# Patient Record
Sex: Male | Born: 1935 | Race: White | Hispanic: No | Marital: Married | Smoking: Never smoker
Health system: Southern US, Community
[De-identification: ages and names within clinical notes are randomized; demographics above are authoritative.]

## PROBLEM LIST (undated history)

## (undated) DIAGNOSIS — C439 Malignant melanoma of skin, unspecified: Secondary | ICD-10-CM

## (undated) DIAGNOSIS — G4733 Obstructive sleep apnea (adult) (pediatric): Secondary | ICD-10-CM

## (undated) DIAGNOSIS — Z8619 Personal history of other infectious and parasitic diseases: Secondary | ICD-10-CM

## (undated) DIAGNOSIS — Z950 Presence of cardiac pacemaker: Secondary | ICD-10-CM

## (undated) DIAGNOSIS — E785 Hyperlipidemia, unspecified: Secondary | ICD-10-CM

## (undated) DIAGNOSIS — I10 Essential (primary) hypertension: Secondary | ICD-10-CM

## (undated) DIAGNOSIS — R7303 Prediabetes: Secondary | ICD-10-CM

## (undated) DIAGNOSIS — I495 Sick sinus syndrome: Secondary | ICD-10-CM

## (undated) DIAGNOSIS — G4719 Other hypersomnia: Secondary | ICD-10-CM

## (undated) DIAGNOSIS — F0390 Unspecified dementia without behavioral disturbance: Secondary | ICD-10-CM

## (undated) DIAGNOSIS — I2510 Atherosclerotic heart disease of native coronary artery without angina pectoris: Secondary | ICD-10-CM

## (undated) DIAGNOSIS — R55 Syncope and collapse: Secondary | ICD-10-CM

## (undated) DIAGNOSIS — Z87442 Personal history of urinary calculi: Secondary | ICD-10-CM

## (undated) DIAGNOSIS — R413 Other amnesia: Secondary | ICD-10-CM

## (undated) HISTORY — DX: Obstructive sleep apnea (adult) (pediatric): G47.33

## (undated) HISTORY — DX: Hyperlipidemia, unspecified: E78.5

## (undated) HISTORY — DX: Essential (primary) hypertension: I10

## (undated) HISTORY — DX: Sick sinus syndrome: I49.5

## (undated) HISTORY — DX: Other hypersomnia: G47.19

## (undated) HISTORY — PX: OTHER SURGICAL HISTORY: SHX169

## (undated) HISTORY — DX: Malignant melanoma of skin, unspecified: C43.9

## (undated) HISTORY — DX: Syncope and collapse: R55

## (undated) HISTORY — DX: Atherosclerotic heart disease of native coronary artery without angina pectoris: I25.10

---

## 1993-06-08 DIAGNOSIS — I2510 Atherosclerotic heart disease of native coronary artery without angina pectoris: Secondary | ICD-10-CM | POA: Insufficient documentation

## 1993-06-08 HISTORY — DX: Atherosclerotic heart disease of native coronary artery without angina pectoris: I25.10

## 2016-01-06 ENCOUNTER — Encounter (INDEPENDENT_AMBULATORY_CARE_PROVIDER_SITE_OTHER): Payer: Self-pay

## 2016-01-06 ENCOUNTER — Encounter: Payer: Self-pay | Admitting: Cardiovascular Disease

## 2016-01-06 ENCOUNTER — Ambulatory Visit (INDEPENDENT_AMBULATORY_CARE_PROVIDER_SITE_OTHER): Payer: Medicare Other | Admitting: Cardiovascular Disease

## 2016-01-06 VITALS — BP 104/70 | HR 53 | Ht 68.5 in | Wt 177.0 lb

## 2016-01-06 DIAGNOSIS — E785 Hyperlipidemia, unspecified: Secondary | ICD-10-CM | POA: Diagnosis not present

## 2016-01-06 DIAGNOSIS — I2510 Atherosclerotic heart disease of native coronary artery without angina pectoris: Secondary | ICD-10-CM | POA: Diagnosis not present

## 2016-01-06 DIAGNOSIS — R55 Syncope and collapse: Secondary | ICD-10-CM | POA: Diagnosis not present

## 2016-01-06 NOTE — Patient Instructions (Addendum)
Medication Instructions:  Your physician recommends that you continue on your current medications as directed. Please refer to the Current Medication list given to you today.   Labwork: none  Testing/Procedures: Your physician has requested that you have an echocardiogram. Echocardiography is a painless test that uses sound waves to create images of your heart. It provides your doctor with information about the size and shape of your heart and how well your heart's chambers and valves are working. This procedure takes approximately one hour. There are no restrictions for this procedure.  Your physician has recommended that you wear an event monitor. Event monitors are medical devices that record the heart's electrical activity. Doctors most often [place] these monitors to diagnose arrhythmias. Arrhythmias are problems with the speed or rhythm of the heartbeat. The monitor is a small, portable device. You can wear one while you do your normal daily activities. This is usually used to diagnose what is causing palpitations/syncope (passing out).    Follow-Up: Your physician recommends that you schedule a follow-up appointment in: two months with Dr. Fletcher Anon.    Any Other Special Instructions Will Be Listed Below (If Applicable).     If you need a refill on your cardiac medications before your next appointment, please call your pharmacy.   Cardiac Event Monitoring A cardiac event monitor is a small recording device used to help detect abnormal heart rhythms (arrhythmias). The monitor is used to record heart rhythm when noticeable symptoms such as the following occur:  Fast heartbeats (palpitations), such as heart racing or fluttering.  Dizziness.  Fainting or light-headedness.  Unexplained weakness. The monitor is wired to two electrodes placed on your chest. Electrodes are flat, sticky disks that attach to your skin. The monitor can be worn for up to 30 days. You will wear the monitor at  all times, except when bathing.  HOW TO USE YOUR CARDIAC EVENT MONITOR A technician will prepare your chest for the electrode placement. The technician will show you how to place the electrodes, how to work the monitor, and how to replace the batteries. Take time to practice using the monitor before you leave the office. Make sure you understand how to send the information from the monitor to your health care provider. This requires a telephone with a landline, not a cell phone. You need to:  Wear your monitor at all times, except when you are in water:  Do not get the monitor wet.  Take the monitor off when bathing. Do not swim or use a hot tub with it on.  Keep your skin clean. Do not put body lotion or moisturizer on your chest.  Change the electrodes daily or any time they stop sticking to your skin. You might need to use tape to keep them on.  It is possible that your skin under the electrodes could become irritated. To keep this from happening, try to put the electrodes in slightly different places on your chest. However, they must remain in the area under your left breast and in the upper right section of your chest.  Make sure the monitor is safely clipped to your clothing or in a location close to your body that your health care provider recommends.  Press the button to record when you feel symptoms of heart trouble, such as dizziness, weakness, light-headedness, palpitations, thumping, shortness of breath, unexplained weakness, or a fluttering or racing heart. The monitor is always on and records what happened slightly before you pressed the button, so do  not worry about being too late to get good information.  Keep a diary of your activities, such as walking, doing chores, and taking medicine. It is especially important to note what you were doing when you pushed the button to record your symptoms. This will help your health care provider determine what might be contributing to your  symptoms. The information stored in your monitor will be reviewed by your health care provider alongside your diary entries.  Send the recorded information as recommended by your health care provider. It is important to understand that it will take some time for your health care provider to process the results.  Change the batteries as recommended by your health care provider. SEEK IMMEDIATE MEDICAL CARE IF:   You have chest pain.  You have extreme difficulty breathing or shortness of breath.  You develop a very fast heartbeat that persists.  You develop dizziness that does not go away.  You faint or constantly feel you are about to faint.   This information is not intended to replace advice given to you by your health care provider. Make sure you discuss any questions you have with your health care provider.   Document Released: 03/03/2008 Document Revised: 06/15/2014 Document Reviewed: 11/21/2012 Elsevier Interactive Patient Education [employer]. Echocardiogram An echocardiogram, or echocardiography, uses sound waves (ultrasound) to produce an image of your heart. The echocardiogram is simple, painless, obtained within a short period of time, and offers valuable information to your health care provider. The images from an echocardiogram can provide information such as:  Evidence of coronary artery disease (CAD).  Heart size.  Heart muscle function.  Heart valve function.  Aneurysm detection.  Evidence of a past heart attack.  Fluid buildup around the heart.  Heart muscle thickening.  Assess heart valve function. LET [facility] CARE PROVIDER KNOW ABOUT:  Any allergies you have.  All medicines you are taking, including vitamins, herbs, eye drops, creams, and over-the-counter medicines.  Previous problems you or members of your family have had with the use of anesthetics.  Any blood disorders you have.  Previous surgeries you have had.  Medical conditions  you have.  Possibility of pregnancy, if this applies. BEFORE THE PROCEDURE  No special preparation is needed. Eat and drink normally.  PROCEDURE   In order to produce an image of your heart, gel will be applied to your chest and a wand-like tool (transducer) will be moved over your chest. The gel will help transmit the sound waves from the transducer. The sound waves will harmlessly bounce off your heart to allow the heart images to be captured in real-time motion. These images will then be recorded.  You may need an IV to receive a medicine that improves the quality of the pictures. AFTER THE PROCEDURE You may return to your normal schedule including diet, activities, and medicines, unless your health care provider tells you otherwise.   This information is not intended to replace advice given to you by your health care provider. Make sure you discuss any questions you have with your health care provider.   Document Released: 05/22/2000 Document Revised: 06/15/2014 Document Reviewed: 01/30/2013 Elsevier Interactive Patient Education [employer].

## 2016-01-07 ENCOUNTER — Encounter: Payer: Self-pay | Admitting: Cardiovascular Disease

## 2016-01-07 NOTE — Progress Notes (Signed)
Cardiology Office Note   Date:  01/07/2016   ID:  Joel Nelson, DOB 06/25/1933, MRN [id number]  PCP:  Manon Hilding, MD  Cardiologist:   Kathlyn Sacramento, MD   Chief Complaint  Patient presents with  . New Patient (Initial Visit)    NO COMPLAINTS.      History of Present Illness: Joel Nelson is a 82 y.o. male who is here today to establish cardiovascular care. He has known history of coronary artery disease with remote left circumflex angioplasty without stent placement in 1995. No ischemic cardiac events since then. He use to follow-up with [place] [facility] in [place]. Most recent visit was in 2015. Last cardiac evaluation included an echocardiogram in 2009 which showed normal LV systolic function with mild mitral and tricuspid regurgitation. Stress echo in 2009 showed no evidence of ischemia. He has chronic medical conditions that include borderline diabetes, hyperlipidemia, hypertension and early dementia. He is not a smoker and does not drink alcohol. Family history is negative for coronary artery disease. The patient was started on sertraline and Aricept this year. He had an episode of syncope about one month ago while he was working in the yard. He doesn't really remember the details but he was outside in the hot weather and lost consciousness suddenly. It was witnessed and there was no noted seizure activities or incontinence. No tongue biting. The episode was sudden. No recurrent episodes since then. He denies orthostatic dizziness. No chest pain. He has mild exertional dyspnea.   Past Medical History:  Diagnosis Date  . Hyperlipidemia   . Hypertension   . Melanoma [facility])     Past Surgical History:  Procedure Laterality Date  . herniated disk repair       Current Outpatient Prescriptions  Medication Sig Dispense Refill  . ACCU-CHEK SOFTCLIX LANCETS lancets as directed.    Marland Kitchen aspirin EC 81 MG tablet Take 81 mg by mouth daily.    . Blood Glucose  Calibration (ACCU-CHEK COMPACT PLUS CONTROL) SOLN as directed.    . Blood Glucose Monitoring Suppl (ACCU-CHEK COMPACT CARE KIT) KIT as directed.    . Cyanocobalamin (RA VITAMIN B-12 TR) 1000 MCG TBCR Take 1 tablet by mouth daily.    Marland Kitchen ezetimibe (ZETIA) 10 MG tablet Take 10 mg by mouth daily.    . fluticasone (FLONASE) 50 MCG/ACT nasal spray Place 1 spray into the nose daily as needed.    Marland Kitchen glucose blood (ACCU-CHEK COMPACT PLUS) test strip as directed.    Marland Kitchen losartan (COZAAR) 25 MG tablet Take 25 mg by mouth daily.    . Multiple Vitamin (MULTI-VITAMINS) TABS Take 1 tablet by mouth daily.    . nitroGLYCERIN (NITROSTAT) 0.4 MG SL tablet Place 0.4 mg under the tongue every 5 (five) minutes as needed.    . rivastigmine (EXELON) 9.5 mg/24hr Place 9.5 mg onto the skin daily.    . rosuvastatin (CRESTOR) 5 MG tablet Take 5 mg by mouth daily.    . sertraline (ZOLOFT) 50 MG tablet Take 50 mg by mouth daily.    . sildenafil (REVATIO) 20 MG tablet Take 20 mg by mouth daily as needed.    . tamsulosin (FLOMAX) 0.4 MG CAPS capsule Take 0.4 mg by mouth daily.     No current facility-administered medications for this visit.     Allergies:   Review of patient's allergies indicates no known allergies.    Social History:  The patient  reports that he has never smoked. He has never used  smokeless tobacco. He reports that he does not drink alcohol or use drugs.   Family History:  The patient's family history includes Alzheimer's disease in his mother; Lung cancer in his sister.    ROS:  Please see the history of present illness.   Otherwise, review of systems are positive for none.   All other systems are reviewed and negative.    PHYSICAL EXAM: VS:  BP 104/70 (BP Location: Left Arm, Patient Position: Sitting, Cuff Size: Normal)   Pulse (!) 53   Ht 5' 8.5" (1.74 m)   Wt 177 lb (80.3 kg)   BMI 26.52 kg/m  , BMI Body mass index is 26.52 kg/m. GEN: Well nourished, well developed, in no acute distress    HEENT: normal  Neck: no JVD, carotid bruits, or masses Cardiac: RRR; no murmurs, rubs, or gallops,no edema  Respiratory:  clear to auscultation bilaterally, normal work of breathing GI: soft, nontender, nondistended, + BS MS: no deformity or atrophy  Skin: warm and dry, no rash Neuro:  Strength and sensation are intact Psych: euthymic mood, full affect   EKG:  EKG is ordered today. The ekg ordered today demonstrates sinus bradycardia with a heart rate of 53 bpm.   Recent Labs: No results found for requested labs within last 8760 hours.    Lipid Panel No results found for: CHOL, TRIG, HDL, CHOLHDL, VLDL, LDLCALC, LDLDIRECT    Wt Readings from Last 3 Encounters:  01/06/16 177 lb (80.3 kg)      Other studies Reviewed: Additional studies/ records that were reviewed today include: Previous cardiac records. Review of the above records demonstrates: Summarized above. A total of 20 minutes was needed to review his previous records   ASSESSMENT AND PLAN:  1.  Syncope: The possibilities include vasovagal, orthostatic hypotension or cardiac arrhythmia given his known history of coronary artery disease. He is also noted to be bradycardic which might have contributed. The addition of sertraline and Aricept this year might increase his risk of syncope related to orthostatic hypotension. Given that the episode was sudden with no reported prodrome, we have to exclude arrhythmia. Thus, I requested a 30 day outpatient telemetry. He is not having symptoms frequently enough to be captured by a Holter monitor. I also requested an echocardiogram to ensure no structural heart abnormalities.  2. Coronary artery disease involving native coronary arteries without angina: Continue medical therapy.  3. Hyperlipidemia: He is currently on small dose rosuvastatin and Zetia. Recommend a target LDL of less than 70.    Disposition:   FU with me in 2 months  Signed,  Kathlyn Sacramento, MD  01/07/2016  1:16 PM    [facility]

## 2016-01-11 ENCOUNTER — Ambulatory Visit (INDEPENDENT_AMBULATORY_CARE_PROVIDER_SITE_OTHER): Payer: Medicare Other

## 2016-01-11 DIAGNOSIS — R55 Syncope and collapse: Secondary | ICD-10-CM | POA: Diagnosis not present

## 2016-01-22 ENCOUNTER — Ambulatory Visit (INDEPENDENT_AMBULATORY_CARE_PROVIDER_SITE_OTHER): Payer: Medicare Other

## 2016-01-22 ENCOUNTER — Encounter (INDEPENDENT_AMBULATORY_CARE_PROVIDER_SITE_OTHER): Payer: Self-pay

## 2016-01-22 ENCOUNTER — Other Ambulatory Visit: Payer: Self-pay

## 2016-01-22 DIAGNOSIS — R55 Syncope and collapse: Secondary | ICD-10-CM

## 2016-01-22 LAB — ECHOCARDIOGRAM COMPLETE
CHL CUP TV REG PEAK VELOCITY: 240 cm/s
EERAT: 5.65
EWDT: 317 ms
FS: 33 % (ref 28–44)
IVS/LV PW RATIO, ED: 0.98
LA ID, A-P, ES: 36 mm
LA vol index: 22.8 mL/m2
LADIAMINDEX: 1.81 cm/m2
LAVOL: 45.3 mL
LAVOLA4C: 52.2 mL
LDCA: 2.84 cm2
LEFT ATRIUM END SYS DIAM: 36 mm
LV E/e' medial: 5.65
LV PW d: 8.04 mm — AB (ref 0.6–1.1)
LV TDI E'MEDIAL: 7.06
LVEEAVG: 5.65
LVELAT: 9.09 cm/s
LVOT SV: 63 mL
LVOT VTI: 22.3 cm
LVOT peak vel: 104 cm/s
LVOTD: 19 mm
MV Dec: 317
MV pk E vel: 51.4 m/s
MVPKAVEL: 66.5 m/s
RV TAPSE: 22.9 mm
RV sys press: 26 mmHg
TDI e' lateral: 9.09
TRMAXVEL: 240 cm/s

## 2016-02-24 ENCOUNTER — Encounter: Payer: Self-pay | Admitting: Cardiovascular Disease

## 2016-02-24 ENCOUNTER — Ambulatory Visit (INDEPENDENT_AMBULATORY_CARE_PROVIDER_SITE_OTHER): Payer: Medicare Other | Admitting: Cardiovascular Disease

## 2016-02-24 VITALS — BP 130/70 | HR 60 | Ht 68.5 in | Wt 182.0 lb

## 2016-02-24 DIAGNOSIS — I2510 Atherosclerotic heart disease of native coronary artery without angina pectoris: Secondary | ICD-10-CM | POA: Diagnosis not present

## 2016-02-24 DIAGNOSIS — E785 Hyperlipidemia, unspecified: Secondary | ICD-10-CM

## 2016-02-24 NOTE — Progress Notes (Signed)
Cardiology Office Note   Date:  02/24/2016   ID:  Joel Nelson, DOB 1934/04/15, MRN [id number]  PCP:  Manon Hilding, MD  Cardiologist:   Kathlyn Sacramento, MD   Chief Complaint  Patient presents with  . other    2 month f/u echo and event monitor. Meds reviewed verbally with pt.      History of Present Illness: Joel Nelson is a 82 y.o. male who is here today For a follow-up visit regarding coronary artery disease and recent syncope.  He has known history of coronary artery disease with remote left circumflex angioplasty without stent placement in 1995. No ischemic cardiac events since then. He has chronic medical conditions that include borderline diabetes, hyperlipidemia, hypertension and early dementia. He is not a smoker and does not drink alcohol. Family history is negative for coronary artery disease. He was seen in July for a syncopal episode which happened while he was working in the yard.  He was on Aricept at that time. Orthostatic syncope was suspected.  He had an echocardiogram done which showed normal LV systolic function, grade 1 diastolic dysfunction, mild mitral regurgitation and no evidence of pulmonary hypertension. A 30 day outpatient telemetry showed no significant arrhythmia. Lowest heart rate was 49 bpm. He has been doing well overall with no recurrent episodes. He denies any chest pain or shortness of breath.  Past Medical History:  Diagnosis Date  . Coronary artery disease 1995    remote left circumflex angioplasty without stent placement in 1995  . Hyperlipidemia   . Hypertension   . Melanoma [facility])     Past Surgical History:  Procedure Laterality Date  . herniated disk repair       Current Outpatient Prescriptions  Medication Sig Dispense Refill  . ACCU-CHEK SOFTCLIX LANCETS lancets as directed.    Marland Kitchen aspirin EC 81 MG tablet Take 81 mg by mouth daily.    . Blood Glucose Calibration (ACCU-CHEK COMPACT PLUS CONTROL) SOLN as directed.    . Blood  Glucose Monitoring Suppl (ACCU-CHEK COMPACT CARE KIT) KIT as directed.    . Cyanocobalamin (RA VITAMIN B-12 TR) 1000 MCG TBCR Take 1 tablet by mouth daily.    Marland Kitchen ezetimibe (ZETIA) 10 MG tablet Take 10 mg by mouth daily.    . fluticasone (FLONASE) 50 MCG/ACT nasal spray Place 1 spray into the nose daily as needed.    Marland Kitchen glucose blood (ACCU-CHEK COMPACT PLUS) test strip as directed.    Marland Kitchen losartan (COZAAR) 25 MG tablet Take 25 mg by mouth daily.    . Multiple Vitamin (MULTI-VITAMINS) TABS Take 1 tablet by mouth daily.    . nitroGLYCERIN (NITROSTAT) 0.4 MG SL tablet Place 0.4 mg under the tongue every 5 (five) minutes as needed.    . rivastigmine (EXELON) 9.5 mg/24hr Place 9.5 mg onto the skin daily.    . rosuvastatin (CRESTOR) 5 MG tablet Take 5 mg by mouth daily.    . sertraline (ZOLOFT) 50 MG tablet Take 50 mg by mouth daily.    . sildenafil (REVATIO) 20 MG tablet Take 20 mg by mouth daily as needed.    . tamsulosin (FLOMAX) 0.4 MG CAPS capsule Take 0.4 mg by mouth daily.     No current facility-administered medications for this visit.     Allergies:   Review of patient's allergies indicates no known allergies.    Social History:  The patient  reports that he has never smoked. He has never used smokeless tobacco. He reports that  he does not drink alcohol or use drugs.   Family History:  The patient's family history includes Alzheimer's disease in his mother; Lung cancer in his sister.    ROS:  Please see the history of present illness.   Otherwise, review of systems are positive for none.   All other systems are reviewed and negative.    PHYSICAL EXAM: VS:  BP 130/70 (BP Location: Left Arm, Patient Position: Sitting, Cuff Size: Normal)   Pulse 60   Ht 5' 8.5" (1.74 m)   Wt 182 lb (82.6 kg)   BMI 27.27 kg/m  , BMI Body mass index is 27.27 kg/m. GEN: Well nourished, well developed, in no acute distress  HEENT: normal  Neck: no JVD, carotid bruits, or masses Cardiac: RRR; no murmurs,  rubs, or gallops,no edema  Respiratory:  clear to auscultation bilaterally, normal work of breathing GI: soft, nontender, nondistended, + BS MS: no deformity or atrophy  Skin: warm and dry, no rash Neuro:  Strength and sensation are intact Psych: euthymic mood, full affect   EKG:  EKG is not ordered today.    Recent Labs: No results found for requested labs within last 8760 hours.    Lipid Panel No results found for: CHOL, TRIG, HDL, CHOLHDL, VLDL, LDLCALC, LDLDIRECT    Wt Readings from Last 3 Encounters:  02/24/16 182 lb (82.6 kg)  01/06/16 177 lb (80.3 kg)      Other studies Reviewed: Additional studies/ records that were reviewed today include: Previous cardiac records. Review of the above records demonstrates: Summarized above. A total of 20 minutes was needed to review his previous records   ASSESSMENT AND PLAN:  1.  Syncope:Was likely due to orthostatic hypotension. Echocardiogram showed normal LV systolic function with no evidence of significant valvular heart disease. Outpatient telemetry showed no evidence of arrhythmia.  No further recurrent episodes. If he develops recurrent dizziness, syncope or presyncope, we can consider carotid Doppler.  2. Coronary artery disease involving native coronary arteries without angina: Continue medical therapy.  3. Hyperlipidemia: He is currently on small dose rosuvastatin and Zetia. Recommend a target LDL of less than 70.    Disposition:   FU with me in 12 months  Signed,  Kathlyn Sacramento, MD  02/24/2016 2:04 PM    [place]

## 2016-02-24 NOTE — Patient Instructions (Signed)
Medication Instructions: Continue same medications.   Labwork: None.   Procedures/Testing: None.   Follow-Up: 1 year with Dr. Keiston Manley.   Any Additional Special Instructions Will Be Listed Below (If Applicable).     If you need a refill on your cardiac medications before your next appointment, please call your pharmacy.   

## 2016-03-02 ENCOUNTER — Ambulatory Visit: Payer: Medicare Other | Admitting: Cardiovascular Disease

## 2016-03-24 ENCOUNTER — Emergency Department (HOSPITAL_COMMUNITY): Payer: Medicare Other

## 2016-03-24 ENCOUNTER — Encounter (HOSPITAL_COMMUNITY)
Admission: EM | Disposition: A | Discharge status: Home / Self Care | Payer: Self-pay | Source: Home / Self Care | Attending: Internal Medicine

## 2016-03-24 ENCOUNTER — Encounter (HOSPITAL_COMMUNITY): Payer: Self-pay | Admitting: *Deleted

## 2016-03-24 ENCOUNTER — Inpatient Hospital Stay (HOSPITAL_COMMUNITY)
Admission: EM | Admit: 2016-03-24 | Discharge: 2016-03-25 | DRG: 242 | Disposition: A | Discharge status: Home / Self Care | Payer: Medicare Other | Attending: Internal Medicine | Admitting: Internal Medicine

## 2016-03-24 DIAGNOSIS — Z7951 Long term (current) use of inhaled steroids: Secondary | ICD-10-CM | POA: Diagnosis not present

## 2016-03-24 DIAGNOSIS — I495 Sick sinus syndrome: Secondary | ICD-10-CM | POA: Diagnosis not present

## 2016-03-24 DIAGNOSIS — R001 Bradycardia, unspecified: Secondary | ICD-10-CM

## 2016-03-24 DIAGNOSIS — E785 Hyperlipidemia, unspecified: Secondary | ICD-10-CM | POA: Diagnosis present

## 2016-03-24 DIAGNOSIS — Z820 Family history of epilepsy and other diseases of the nervous system: Secondary | ICD-10-CM | POA: Diagnosis not present

## 2016-03-24 DIAGNOSIS — I10 Essential (primary) hypertension: Secondary | ICD-10-CM | POA: Diagnosis present

## 2016-03-24 DIAGNOSIS — I459 Conduction disorder, unspecified: Secondary | ICD-10-CM

## 2016-03-24 DIAGNOSIS — R55 Syncope and collapse: Secondary | ICD-10-CM

## 2016-03-24 DIAGNOSIS — Z7982 Long term (current) use of aspirin: Secondary | ICD-10-CM | POA: Diagnosis not present

## 2016-03-24 DIAGNOSIS — Z801 Family history of malignant neoplasm of trachea, bronchus and lung: Secondary | ICD-10-CM

## 2016-03-24 DIAGNOSIS — Z950 Presence of cardiac pacemaker: Secondary | ICD-10-CM

## 2016-03-24 DIAGNOSIS — I469 Cardiac arrest, cause unspecified: Secondary | ICD-10-CM | POA: Diagnosis present

## 2016-03-24 DIAGNOSIS — Z9861 Coronary angioplasty status: Secondary | ICD-10-CM | POA: Diagnosis not present

## 2016-03-24 DIAGNOSIS — Z85820 Personal history of malignant melanoma of skin: Secondary | ICD-10-CM

## 2016-03-24 DIAGNOSIS — I2510 Atherosclerotic heart disease of native coronary artery without angina pectoris: Secondary | ICD-10-CM | POA: Diagnosis present

## 2016-03-24 DIAGNOSIS — Z959 Presence of cardiac and vascular implant and graft, unspecified: Secondary | ICD-10-CM

## 2016-03-24 HISTORY — DX: Sick sinus syndrome: I49.5

## 2016-03-24 HISTORY — DX: Personal history of urinary calculi: Z87.442

## 2016-03-24 HISTORY — DX: Other amnesia: R41.3

## 2016-03-24 HISTORY — DX: Prediabetes: R73.03

## 2016-03-24 HISTORY — DX: Presence of cardiac pacemaker: Z95.0

## 2016-03-24 HISTORY — DX: Personal history of other infectious and parasitic diseases: Z86.19

## 2016-03-24 HISTORY — PX: EP IMPLANTABLE DEVICE: SHX172B

## 2016-03-24 HISTORY — DX: Syncope and collapse: R55

## 2016-03-24 LAB — I-STAT CHEM 8, ED
BUN: 17 mg/dL (ref 6–20)
Calcium, Ion: 1.18 mmol/L (ref 1.15–1.40)
Chloride: 100 mmol/L — ABNORMAL LOW (ref 101–111)
Creatinine, Ser: 1 mg/dL (ref 0.61–1.24)
GLUCOSE: 139 mg/dL — AB (ref 65–99)
HEMATOCRIT: 47 % (ref 39.0–52.0)
HEMOGLOBIN: 16 g/dL (ref 13.0–17.0)
POTASSIUM: 4.5 mmol/L (ref 3.5–5.1)
Sodium: 140 mmol/L (ref 135–145)
TCO2: 30 mmol/L (ref 0–100)

## 2016-03-24 LAB — COMPREHENSIVE METABOLIC PANEL
ALK PHOS: 48 U/L (ref 38–126)
ALT: 46 U/L (ref 17–63)
ANION GAP: 5 (ref 5–15)
AST: 29 U/L (ref 15–41)
Albumin: 3.8 g/dL (ref 3.5–5.0)
BILIRUBIN TOTAL: 0.7 mg/dL (ref 0.3–1.2)
BUN: 13 mg/dL (ref 6–20)
CALCIUM: 9.3 mg/dL (ref 8.9–10.3)
CO2: 31 mmol/L (ref 22–32)
Chloride: 104 mmol/L (ref 101–111)
Creatinine, Ser: 1.03 mg/dL (ref 0.61–1.24)
Glucose, Bld: 143 mg/dL — ABNORMAL HIGH (ref 65–99)
Potassium: 4.5 mmol/L (ref 3.5–5.1)
SODIUM: 140 mmol/L (ref 135–145)
TOTAL PROTEIN: 6.3 g/dL — AB (ref 6.5–8.1)

## 2016-03-24 LAB — I-STAT TROPONIN, ED: TROPONIN I, POC: 0 ng/mL (ref 0.00–0.08)

## 2016-03-24 LAB — CBC WITH DIFFERENTIAL/PLATELET
BASOS PCT: 0 %
Basophils Absolute: 0 10*3/uL (ref 0.0–0.1)
EOS ABS: 0.1 10*3/uL (ref 0.0–0.7)
EOS PCT: 1 %
HCT: 46 % (ref 39.0–52.0)
HEMOGLOBIN: 15.2 g/dL (ref 13.0–17.0)
Lymphocytes Relative: 15 %
Lymphs Abs: 1.2 10*3/uL (ref 0.7–4.0)
MCH: 30.2 pg (ref 26.0–34.0)
MCHC: 33 g/dL (ref 30.0–36.0)
MCV: 91.5 fL (ref 78.0–100.0)
Monocytes Absolute: 0.7 10*3/uL (ref 0.1–1.0)
Monocytes Relative: 8 %
NEUTROS PCT: 76 %
Neutro Abs: 6.5 10*3/uL (ref 1.7–7.7)
PLATELETS: 169 10*3/uL (ref 150–400)
RBC: 5.03 MIL/uL (ref 4.22–5.81)
RDW: 13.8 % (ref 11.5–15.5)
WBC: 8.6 10*3/uL (ref 4.0–10.5)

## 2016-03-24 LAB — TSH: TSH: 2.35 u[IU]/mL (ref 0.350–4.500)

## 2016-03-24 LAB — MAGNESIUM: MAGNESIUM: 1.9 mg/dL (ref 1.7–2.4)

## 2016-03-24 LAB — GLUCOSE, CAPILLARY: GLUCOSE-CAPILLARY: 100 mg/dL — AB (ref 65–99)

## 2016-03-24 LAB — TROPONIN I

## 2016-03-24 SURGERY — PACEMAKER IMPLANT

## 2016-03-24 MED ORDER — INSULIN ASPART 100 UNIT/ML ~~LOC~~ SOLN: 0.0000 [IU] | Freq: Every day | SUBCUTANEOUS | Status: DC

## 2016-03-24 MED ORDER — LOSARTAN POTASSIUM 25 MG PO TABS
25.0000 mg | ORAL_TABLET | Freq: Every day | ORAL | Status: DC
Start: 2016-03-25 — End: 2016-03-25
  Administered 2016-03-25: 25 mg via ORAL
  Filled 2016-03-24: qty 1

## 2016-03-24 MED ORDER — CEFAZOLIN SODIUM-DEXTROSE 2-4 GM/100ML-% IV SOLN
2.0000 g | INTRAVENOUS | Status: AC
  Administered 2016-03-24: 2 g via INTRAVENOUS

## 2016-03-24 MED ORDER — SERTRALINE HCL 50 MG PO TABS
50.0000 mg | ORAL_TABLET | Freq: Every day | ORAL | Status: DC
  Filled 2016-03-24: qty 1

## 2016-03-24 MED ORDER — SODIUM CHLORIDE 0.9 % IV SOLN
250.0000 mL | INTRAVENOUS | Status: DC | PRN
Start: 2016-03-24 — End: 2016-03-25

## 2016-03-24 MED ORDER — LIDOCAINE HCL (PF) 1 % IJ SOLN
INTRAMUSCULAR | Status: AC
  Filled 2016-03-24: qty 60

## 2016-03-24 MED ORDER — HYDROCODONE-ACETAMINOPHEN 5-325 MG PO TABS: 1.0000 | ORAL_TABLET | ORAL | Status: DC | PRN

## 2016-03-24 MED ORDER — CHLORHEXIDINE GLUCONATE 4 % EX LIQD
60.0000 mL | Freq: Once | CUTANEOUS | Status: DC
  Filled 2016-03-24: qty 60

## 2016-03-24 MED ORDER — SODIUM CHLORIDE 0.9 % IV SOLN: INTRAVENOUS | Status: DC

## 2016-03-24 MED ORDER — CEFAZOLIN IN D5W 1 GM/50ML IV SOLN
1.0000 g | Freq: Four times a day (QID) | INTRAVENOUS | Status: AC
  Administered 2016-03-24 – 2016-03-25 (×3): 1 g via INTRAVENOUS
  Filled 2016-03-24 (×3): qty 50

## 2016-03-24 MED ORDER — ONDANSETRON HCL 4 MG/2ML IJ SOLN: 4.0000 mg | Freq: Four times a day (QID) | INTRAMUSCULAR | Status: DC | PRN

## 2016-03-24 MED ORDER — INSULIN ASPART 100 UNIT/ML ~~LOC~~ SOLN
0.0000 [IU] | Freq: Three times a day (TID) | SUBCUTANEOUS | Status: DC
  Administered 2016-03-24: 1 [IU] via SUBCUTANEOUS

## 2016-03-24 MED ORDER — SODIUM CHLORIDE 0.9 % IR SOLN
Status: AC
  Filled 2016-03-24: qty 2

## 2016-03-24 MED ORDER — SODIUM CHLORIDE 0.9 % IR SOLN
80.0000 mg | Status: AC
  Administered 2016-03-24: 80 mg

## 2016-03-24 MED ORDER — SODIUM CHLORIDE 0.9% FLUSH
3.0000 mL | Freq: Two times a day (BID) | INTRAVENOUS | Status: DC
  Administered 2016-03-24: 3 mL via INTRAVENOUS

## 2016-03-24 MED ORDER — LIDOCAINE HCL (PF) 1 % IJ SOLN
INTRAMUSCULAR | Status: DC | PRN
  Administered 2016-03-24: 42 mL via INTRADERMAL

## 2016-03-24 MED ORDER — ACETAMINOPHEN 325 MG PO TABS
325.0000 mg | ORAL_TABLET | ORAL | Status: DC | PRN
  Administered 2016-03-24 – 2016-03-25 (×2): 650 mg via ORAL
  Filled 2016-03-24 (×2): qty 2

## 2016-03-24 MED ORDER — SODIUM CHLORIDE 0.9% FLUSH: 3.0000 mL | INTRAVENOUS | Status: DC | PRN

## 2016-03-24 MED ORDER — HEPARIN (PORCINE) IN NACL 2-0.9 UNIT/ML-% IJ SOLN
INTRAMUSCULAR | Status: DC | PRN
  Administered 2016-03-24: 500 mL

## 2016-03-24 MED ORDER — TAMSULOSIN HCL 0.4 MG PO CAPS
0.4000 mg | ORAL_CAPSULE | Freq: Every day | ORAL | Status: DC
  Administered 2016-03-24: 0.4 mg via ORAL
  Filled 2016-03-24: qty 1

## 2016-03-24 MED ORDER — HEPARIN (PORCINE) IN NACL 2-0.9 UNIT/ML-% IJ SOLN
INTRAMUSCULAR | Status: AC
  Filled 2016-03-24: qty 500

## 2016-03-24 MED ORDER — SODIUM CHLORIDE 0.9 % IV SOLN
INTRAVENOUS | Status: DC | PRN
  Administered 2016-03-24 (×2): 50 mL/h via INTRAVENOUS

## 2016-03-24 MED ORDER — CEFAZOLIN SODIUM-DEXTROSE 2-4 GM/100ML-% IV SOLN
INTRAVENOUS | Status: AC
  Filled 2016-03-24: qty 100

## 2016-03-24 SURGICAL SUPPLY — 7 items
CABLE SURGICAL S-101-97-12 (CABLE) ×3 IMPLANT
LEAD TENDRIL SDX 2088TC-52CM (Lead) ×3 IMPLANT
LEAD TENDRIL SDX 2088TC-58CM (Lead) ×3 IMPLANT
PACEMAKER ASSURITY DR-RF (Pacemaker) ×3 IMPLANT
PAD DEFIB LIFELINK (PAD) ×3 IMPLANT
SHEATH CLASSIC 8F (SHEATH) ×6 IMPLANT
TRAY PACEMAKER INSERTION (PACKS) ×3 IMPLANT

## 2016-03-24 NOTE — ED Provider Notes (Signed)
[facility] DEPT Provider Note   CSN: [id number] Arrival date & time: 03/24/16  4332     History   Chief Complaint Chief Complaint  Patient presents with  . Loss of Consciousness    HPI Joel Nelson is a 82 y.o. male.  HPI Patient presents after syncopal episode. Shortly after arriving back into the room he had a bradycardic episode that went into asystole and required CPR. He came back after compressions. Complaining of slight right-sided chest pain. Slightly confused afterward and does have a mild baseline dementia. Patient has had previous episodes of passing out. Today he had to more severe episodes. Wife later told me that he had passed out and EMS was called and was fine at that point. They were going to follow-up with primary care doctor. 50 minutes later had another event. But then came to the ER. He has previously been seen for syncope that reviewing records appears to be thought orthostatic. He had previously worn a monitor. His been doing well the last couple days.   Past Medical History:  Diagnosis Date  . Coronary artery disease 1995    remote left circumflex angioplasty without stent placement in 1995  . History of kidney stones   . History of shingles   . Hyperlipidemia   . Hypertension   . Melanoma ([place])   . Memory loss   . Pre-diabetes   . Presence of permanent cardiac pacemaker 03/24/2016    Patient Active Problem List   Diagnosis Date Noted  . Syncope 03/24/2016  . Sick sinus syndrome ([place]) 03/24/2016  . Coronary artery disease 06/08/1993    Past Surgical History:  Procedure Laterality Date  . EP IMPLANTABLE DEVICE N/A 03/24/2016   Procedure: Pacemaker Implant;  Surgeon: Thompson Grayer, MD;  Location: [facility];  Service: Cardiovascular;  Laterality: N/A;  . herniated disk repair    . INSERT / REPLACE / REMOVE PACEMAKER         Home Medications    Prior to Admission medications   Medication Sig Start Date End Date Taking?  Authorizing Provider  ACCU-CHEK SOFTCLIX LANCETS lancets as directed. 11/21/14  Yes Historical Provider, MD  aspirin EC 81 MG tablet Take 81 mg by mouth daily.   Yes Historical Provider, MD  Blood Glucose Calibration (ACCU-CHEK COMPACT PLUS CONTROL) SOLN as directed. 12/05/14  Yes Historical Provider, MD  Blood Glucose Monitoring Suppl (ACCU-CHEK COMPACT CARE KIT) KIT as directed. 11/21/14  Yes Historical Provider, MD  Cyanocobalamin (RA VITAMIN B-12 TR) 1000 MCG TBCR Take 1 tablet by mouth daily.   Yes Historical Provider, MD  ezetimibe (ZETIA) 10 MG tablet Take 10 mg by mouth daily. 12/01/15  Yes Historical Provider, MD  fluticasone (FLONASE) 50 MCG/ACT nasal spray Place 1 spray into the nose daily.    Yes Historical Provider, MD  fluticasone (FLONASE) 50 MCG/ACT nasal spray Place 1 spray into both nostrils daily as needed for allergies.   Yes Historical Provider, MD  glucose blood (ACCU-CHEK COMPACT PLUS) test strip as directed. 11/21/14  Yes Historical Provider, MD  losartan (COZAAR) 25 MG tablet Take 25 mg by mouth daily. 12/22/13  Yes Historical Provider, MD  Multiple Vitamin (MULTI-VITAMINS) TABS Take 1 tablet by mouth daily.   Yes Historical Provider, MD  nitroGLYCERIN (NITROSTAT) 0.4 MG SL tablet Place 0.4 mg under the tongue every 5 (five) minutes as needed for chest pain.  08/11/10  Yes Historical Provider, MD  rivastigmine (EXELON) 9.5 mg/24hr Place 9.5 mg onto the skin daily.  12/17/15  Yes Historical Provider, MD  rosuvastatin (CRESTOR) 5 MG tablet Take 5 mg by mouth daily.   Yes Historical Provider, MD  sertraline (ZOLOFT) 50 MG tablet Take 50 mg by mouth daily. 08/10/15  Yes Historical Provider, MD  sildenafil (REVATIO) 20 MG tablet Take 20 mg by mouth daily as needed. 02/15/15  Yes Historical Provider, MD  tamsulosin (FLOMAX) 0.4 MG CAPS capsule Take 0.4 mg by mouth daily. 11/21/15  Yes Historical Provider, MD    Family History Family History  Problem Relation Age of Onset  . Alzheimer's  disease Mother   . Lung cancer Sister     Social History Social History  Substance Use Topics  . Smoking status: Never Smoker  . Smokeless tobacco: Never Used  . Alcohol use No     Allergies   Review of patient's allergies indicates no known allergies.   Review of Systems Review of Systems  Constitutional: Negative for appetite change.  HENT: Negative for congestion.   Respiratory: Negative for chest tightness and shortness of breath.   Cardiovascular: Positive for chest pain.  Gastrointestinal: Negative for abdominal pain.  Endocrine: Negative for polyphagia and polyuria.  Genitourinary: Negative for dysuria.  Musculoskeletal: Negative for back pain.  Neurological: Positive for syncope.  Hematological: Negative for adenopathy.  Psychiatric/Behavioral: Negative for agitation.     Physical Exam Updated Vital Signs BP 128/68 (BP Location: Left Arm)   Pulse 64   Temp 97.5 F (36.4 C) (Oral)   Resp 18   Ht _0  (1.727 m)   Wt 180 lb 12.4 oz (82 kg)   SpO2 97%   BMI 27.49 kg/m   Physical Exam  Constitutional: He appears well-developed.  HENT:  Head: Atraumatic.  Eyes: EOM are normal.  Neck: Neck supple.  Cardiovascular:  Mild bradycardia  Pulmonary/Chest: Effort normal.  Abdominal: Soft. There is no tenderness.  Neurological: He is alert.  Skin: Skin is warm. Capillary refill takes less than 2 seconds.     ED Treatments / Results  Labs (all labs ordered are listed, but only abnormal results are displayed) Labs Reviewed  COMPREHENSIVE METABOLIC PANEL - Abnormal; Notable for the following:       Result Value   Glucose, Bld 143 (*)    Total Protein 6.3 (*)    All other components within normal limits  I-STAT CHEM 8, ED - Abnormal; Notable for the following:    Chloride 100 (*)    Glucose, Bld 139 (*)    All other components within normal limits  TROPONIN I  CBC WITH DIFFERENTIAL/PLATELET  TSH  MAGNESIUM  I-STAT TROPOININ, ED    EKG  EKG  Interpretation  Date/Time:  Tuesday March 24 2016 09:22:38 EDT Ventricular Rate:  48 PR Interval:    QRS Duration: 101 QT Interval:  412 QTC Calculation: 369 R Axis:   71 Text Interpretation:  Sinus bradycardia Confirmed by Alvino Chapel  MD, Nomar Broad [phone]) on 03/24/2016 9:37:11 AM       Radiology Dg Chest Port 1 View  Result Date: 03/24/2016 CLINICAL DATA:  Syncope, bradycardia and CPR EXAM: PORTABLE CHEST 1 VIEW COMPARISON:  None. FINDINGS: Heart size within normal limits. Negative for heart failure or edema. Mild bibasilar atelectasis. No pleural effusion or mass. IMPRESSION: Mild bibasilar atelectasis. Electronically Signed   By: Franchot Gallo M.D.   On: 03/24/2016 10:07    Procedures Procedures (including critical care time)  Medications Ordered in ED Medications  losartan (COZAAR) tablet 25 mg (not administered)  sertraline (  ZOLOFT) tablet 50 mg (not administered)  tamsulosin (FLOMAX) capsule 0.4 mg (not administered)  sodium chloride flush (NS) 0.9 % injection 3 mL (not administered)  sodium chloride flush (NS) 0.9 % injection 3 mL (not administered)  0.9 %  sodium chloride infusion (not administered)  ceFAZolin (ANCEF) IVPB 1 g/50 mL premix (not administered)  acetaminophen (TYLENOL) tablet 325-650 mg (not administered)  HYDROcodone-acetaminophen (NORCO/VICODIN) 5-325 MG per tablet 1-2 tablet (not administered)  ondansetron (ZOFRAN) injection 4 mg (not administered)  insulin aspart (novoLOG) injection 0-9 Units (not administered)  insulin aspart (novoLOG) injection 0-5 Units (not administered)  gentamicin (GARAMYCIN) 80 mg in sodium chloride irrigation 0.9 % 500 mL irrigation (80 mg Irrigation Given 03/24/16 1221)  ceFAZolin (ANCEF) IVPB 2g/100 mL premix (2 g Intravenous Given 03/24/16 1142)     Initial Impression / Assessment and Plan / ED Course  I have reviewed the triage vital signs and the nursing notes.  Pertinent labs & imaging results that were available  during my care of the patient were reviewed by me and considered in my medical decision making (see chart for details).  Clinical Course    Patient presented with episodes of syncope. Had had 2 of them at home today. Previous workup with monitor did not find a cause. While in the ER is sounds to have an episode of bradycardia that went down to 40 and then consequently asystole. CPR was done briefly and sinus rhythm returned. Labs reassuring. He is not on nodal blocking agents. Seen by electrophysiology and taken for pacemaker.  Final Clinical Impressions(s) / ED Diagnoses   Final diagnoses:  Bradycardia    New Prescriptions Current Discharge Medication List       Davonna Belling, MD 03/24/16 1627

## 2016-03-24 NOTE — H&P (Addendum)
ELECTROPHYSIOLOGY CONSULT HISTORY AND PHYSICAL     Patient ID: Joel Nelson MRN: [id number], DOB/AGE: 80-Jul-1935 80 y.o.  Admit date: 03/24/2016 Date of Consult: 03/24/2016  Primary Physician: Manon Hilding, MD Primary Cardiologist: Fletcher Anon Consulting physician: Dr Alvino Chapel  CC: syncope and bradycardia  HPI:  Joel Nelson is a 82 y.o. male with a pat medical history significant for CAD s/p angioplasty in the mid 90's, hypertension, and hyperlipidemia.  EP is asked for urgent ED consultation from ED physician. He had an episode of syncope earlier this year for which he was evaluated by Dr Fletcher Anon with event monitor, carotid dopplers, and echocardiogram, all of which were unremarkable. He did well until today when he had 2 syncopal spells while sitting on the couch at home.  His wife called EMS and he was transferred to [facility] for further evaluation.  In the ER, he was talking to the RN and had another syncopal spell with transient sinus arrest that lasted several seconds and received chest compressions for a short time.  EP has been asked to evaluate for treatment options.  He is relatively active at home, walking a mile 5 days per week without chest pain or shortness of breath. He has not had recent fevers or chills.  No dizziness or pre-syncope outside of today.   Echo 01/2016 demonstrated EF 76-19%, grade 1 diastolic dysfunction, mild MR, PA pressure 26  Past Medical History:  Diagnosis Date  . Coronary artery disease 1995    remote left circumflex angioplasty without stent placement in 1995  . Hyperlipidemia   . Hypertension   . Melanoma [facility])      Surgical History:  Past Surgical History:  Procedure Laterality Date  . herniated disk repair      No current facility-administered medications for this encounter.   Current Outpatient Prescriptions:  .  ACCU-CHEK SOFTCLIX LANCETS lancets, as directed., Disp: , Rfl:  .  aspirin EC 81 MG tablet, Take 81 mg by mouth daily., Disp: ,  Rfl:  .  Blood Glucose Calibration (ACCU-CHEK COMPACT PLUS CONTROL) SOLN, as directed., Disp: , Rfl:  .  Blood Glucose Monitoring Suppl (ACCU-CHEK COMPACT CARE KIT) KIT, as directed., Disp: , Rfl:  .  Cyanocobalamin (RA VITAMIN B-12 TR) 1000 MCG TBCR, Take 1 tablet by mouth daily., Disp: , Rfl:  .  ezetimibe (ZETIA) 10 MG tablet, Take 10 mg by mouth daily., Disp: , Rfl:  .  fluticasone (FLONASE) 50 MCG/ACT nasal spray, Place 1 spray into the nose daily. , Disp: , Rfl:  .  fluticasone (FLONASE) 50 MCG/ACT nasal spray, Place 1 spray into both nostrils daily as needed for allergies., Disp: , Rfl:  .  glucose blood (ACCU-CHEK COMPACT PLUS) test strip, as directed., Disp: , Rfl:  .  losartan (COZAAR) 25 MG tablet, Take 25 mg by mouth daily., Disp: , Rfl:  .  Multiple Vitamin (MULTI-VITAMINS) TABS, Take 1 tablet by mouth daily., Disp: , Rfl:  .  nitroGLYCERIN (NITROSTAT) 0.4 MG SL tablet, Place 0.4 mg under the tongue every 5 (five) minutes as needed for chest pain. , Disp: , Rfl:  .  rivastigmine (EXELON) 9.5 mg/24hr, Place 9.5 mg onto the skin daily., Disp: , Rfl:  .  rosuvastatin (CRESTOR) 5 MG tablet, Take 5 mg by mouth daily., Disp: , Rfl:  .  sertraline (ZOLOFT) 50 MG tablet, Take 50 mg by mouth daily., Disp: , Rfl:  .  sildenafil (REVATIO) 20 MG tablet, Take 20 mg by mouth daily as needed., Disp: ,  Rfl:  .  tamsulosin (FLOMAX) 0.4 MG CAPS capsule, Take 0.4 mg by mouth daily., Disp: , Rfl:    Allergies: No Known Allergies  Social History   Social History  . Marital status: Married    Spouse name: N/A  . Number of children: N/A  . Years of education: N/A   Occupational History  . Not on file.   Social History Main Topics  . Smoking status: Never Smoker  . Smokeless tobacco: Never Used  . Alcohol use No  . Drug use: No  . Sexual activity: Yes    Partners: Female     Comment: married   Other Topics Concern  . Not on file   Social History Narrative  . No narrative on file       Family History  Problem Relation Age of Onset  . Alzheimer's disease Mother   . Lung cancer Sister      Review of Systems: All other systems reviewed and are otherwise negative except as noted above.  Physical Exam: Vitals:   03/24/16 1000 03/24/16 1030 03/24/16 1045 03/24/16 1100  BP: 123/66 145/95 138/73 146/66  Pulse: (!) 56 65 (!) 55 (!) 56  Resp: '21 17 20 23  ' SpO2: 96% 95% 96% 97%    GEN- The patient is elderly appearing, alert and oriented x 3 today.   HEENT: normocephalic, atraumatic; sclera clear, conjunctiva pink; hearing intact; oropharynx clear; neck supple  Lungs- Clear to ausculation bilaterally, normal work of breathing.  No wheezes, rales, rhonchi Heart- Regular rate and rhythm  GI- soft, non-tender, non-distended, bowel sounds present  Extremities- no clubbing, cyanosis, or edema; DP/PT/radial pulses 2+ bilaterally MS- no significant deformity or atrophy Skin- warm and dry, no rash or lesion Psych- euthymic mood, full affect Neuro- strength and sensation are intact  Labs:   Lab Results  Component Value Date   WBC 8.6 03/24/2016   HGB 16.0 03/24/2016   HCT 47.0 03/24/2016   MCV 91.5 03/24/2016   PLT 169 03/24/2016    Recent Labs Lab 03/24/16 1000 03/24/16 1010  NA 140 140  K 4.5 4.5  CL 104 100*  CO2 31  --   BUN 13 17  CREATININE 1.03 1.00  CALCIUM 9.3  --   PROT 6.3*  --   BILITOT 0.7  --   ALKPHOS 48  --   ALT 46  --   AST 29  --   GLUCOSE 143* 139*      Radiology/Studies: Dg Chest Port 1 View Result Date: 03/24/2016 CLINICAL DATA:  Syncope, bradycardia and CPR EXAM: PORTABLE CHEST 1 VIEW COMPARISON:  None. FINDINGS: Heart size within normal limits. Negative for heart failure or edema. Mild bibasilar atelectasis. No pleural effusion or mass. IMPRESSION: Mild bibasilar atelectasis. Electronically Signed   By: Franchot Gallo M.D.   On: 03/24/2016 10:07    VOZ:DGUYQ bradycardia, rate 48  TELEMETRY: sinus rhythm with sinus  arrest  Assessment/Plan: 1. Syncope with sinus arrest The patient has documented sinus arrest with syncope with no reversible causes identified. Risks, benefits to pacemaker implantation reviewed with the patient who wishes to proceed. Will plan for later today  2.  CAD No recent ischemic symptoms Continue medical therapy  3.  HTN Stable No change required today   Signed, Chanetta Marshall, NP 03/24/2016 11:11 AM   I have seen, examined the patient, and reviewed the above assessment and plan. On exam, RRR.  Changes to above are made where necessary.  Pt with asystolic  episode/ sick sinus syndrome. The patient has had asystolic arrest in ED for which he required transient CPR.  EP is consulted urgently by ED physician for additional consultation.  No reversible causes are found.  I would therefore recommend pacemaker implantation at this time.  Risks, benefits, alternatives to pacemaker implantation were discussed in detail with the patient today. The patient understands that the risks include but are not limited to bleeding, infection, pneumothorax, perforation, tamponade, vascular damage, renal failure, MI, stroke, death,  and lead dislodgement and wishes to proceed. We will therefore schedule the procedure at this time.  Pt is at risk for further asystolic events/ death.  A high level of decision making was required for this encounter.   Co Sign: Thompson Grayer, MD 03/24/2016 11:29 AM

## 2016-03-24 NOTE — Progress Notes (Signed)
Orthopedic Tech Progress Note Patient Details:  Joel Nelson 23-Aug-1933 [id number] Patient already has sling. Patient ID: [name], male   DOB: 02/17/1934, 82 y.o.   MRN: [id number]   Charlott Rakes 03/24/2016, 3:54 PM

## 2016-03-24 NOTE — ED Notes (Signed)
Pt brady at 61 steadily coming down, David sent to get Dr. Alvino Chapel.

## 2016-03-24 NOTE — ED Notes (Signed)
Cards at bedside

## 2016-03-24 NOTE — ED Triage Notes (Signed)
Pt arrives from home via [facility]. Pt wife called rt pt LOC x2 this morning. Upon arrival pt HR was 64, shortly after arrival pt brady to 20 with a consequent asystole and CPR performed.

## 2016-03-24 NOTE — Discharge Instructions (Signed)
° ° °  Supplemental Discharge Instructions for  Pacemaker/Defibrillator Patients  Activity No heavy lifting or vigorous activity with your left/right arm for 6 to 8 weeks.  Do not raise your left/right arm above your head for one week.  Gradually raise your affected arm as drawn below.           __       03/28/16                   03/29/16                 03/30/16                  03/31/16  NO DRIVING until after wound check   WOUND CARE - Keep the wound area clean and dry.  Do not get this area wet for one week. No showers for one week; you may shower on  03/31/16   . - The tape/steri-strips on your wound will fall off; do not pull them off.  No bandage is needed on the site.  DO  NOT apply any creams, oils, or ointments to the wound area. - If you notice any drainage or discharge from the wound, any swelling or bruising at the site, or you develop a fever > 101? F after you are discharged home, call the office at once.  Special Instructions - You are still able to use cellular telephones; use the ear opposite the side where you have your pacemaker/defibrillator.  Avoid carrying your cellular phone near your device. - When traveling through airports, show security personnel your identification card to avoid being screened in the metal detectors.  Ask the security personnel to use the hand wand. - Avoid arc welding equipment, MRI testing (magnetic resonance imaging), TENS units (transcutaneous nerve stimulators).  Call the office for questions about other devices. - Avoid electrical appliances that are in poor condition or are not properly grounded. - Microwave ovens are safe to be near or to operate.

## 2016-03-24 NOTE — Progress Notes (Signed)
Pt received from cath lab. Pt VSS. Telemetry applied, CCMD notified x2. Pt and wife oriented to room and equipment.   Fritz Pickerel, RN

## 2016-03-24 NOTE — Code Documentation (Signed)
CPR continued, Dr. Alvino Chapel, Mayer Camel, RN now at bedside.

## 2016-03-24 NOTE — Code Documentation (Signed)
Pt regained pulses, CPR stopped and zoll pads applied.

## 2016-03-24 NOTE — Discharge Summary (Signed)
ELECTROPHYSIOLOGY PROCEDURE DISCHARGE SUMMARY    Patient ID: Joel Nelson,  MRN: [id number], DOB/AGE: 11-05-1933 82 y.o.  Admit date: 03/24/2016 Discharge date: 03/25/2016  Primary Care Physician: Manon Hilding, MD Primary Cardiologist: Fletcher Anon Electrophysiologist: Kesia Dalto  Primary Discharge Diagnosis:  Symptomatic bradycardia and syncope status post pacemaker implantation this admission  Secondary Discharge Diagnosis:  1.  CAD 2.  Hypertension 3.  Hyperlipidemia  No Known Allergies   Procedures This Admission:  1.  Implantation of a STJ dual chamber PPM on 03/24/16 by Dr Rayann Heman.  The patient received a STJ model number Assurity PPM with model number 2088 right atrial lead and 2088 right ventricular lead. There were no immediate post procedure complications. 2.  CXR on 03/25/16 demonstrated no pneumothorax status post device implantation.   Brief HPI/Hospital Course:  Joel Nelson is a 82 y.o. male with a past medical history as outlined above. He presented to the ER on the day of admission with recurrent syncope.  While in the ER, he had another syncopal event with documented sinus arrest/asystole and required transient chest compressions.   The patient has had symptomatic bradycardia without reversible causes identified.  Risks, benefits, and alternatives to PPM implantation were reviewed with the patient who wished to proceed. The patient underwent implantation of a STJ dual chamber PPM with details as outlined above.  He  was monitored on telemetry overnight which demonstrated sinus rhythm.  Left chest was without hematoma or ecchymosis.  The device was interrogated and found to be functioning normally.  CXR was obtained and demonstrated no pneumothorax status post device implantation.  Wound care, arm mobility, and restrictions were reviewed with the patient.  The patient was examined and considered stable for discharge to home. No driving until after wound check appointment.     Physical Exam: Vitals:   03/24/16 1346 03/24/16 1416 03/24/16 1934 03/25/16 0447  BP: 138/88 128/68 136/69 (!) 117/57  Pulse: 68 64 61 66  Resp: _0 Temp: 97.7 F (36.5 C) 97.5 F (36.4 C) 98.6 F (37 C) 98.4 F (36.9 C)  TempSrc: Oral Oral Oral Oral  SpO2: 95% 97% 97% 95%  Weight:  180 lb 12.4 oz (82 kg)    Height:  _1  (1.727 m)      GEN- The patient is elderly appearing, alert and oriented x 3 today.   HEENT: normocephalic, atraumatic; sclera clear, conjunctiva pink; hearing intact; oropharynx clear; neck supple  Lungs- Clear to ausculation bilaterally, normal work of breathing.  No wheezes, rales, rhonchi Heart- Regular rate and rhythm, no murmurs, rubs or gallops  GI- soft, non-tender, non-distended, bowel sounds present  Extremities- no clubbing, cyanosis, or edema  MS- no significant deformity or atrophy Skin- warm and dry, no rash or lesion, left chest without hematoma/ecchymosis Psych- euthymic mood, full affect Neuro- strength and sensation are intact   Labs:   Lab Results  Component Value Date   WBC 8.6 03/24/2016   HGB 16.0 03/24/2016   HCT 47.0 03/24/2016   MCV 91.5 03/24/2016   PLT 169 03/24/2016     Recent Labs Lab 03/24/16 1000 03/24/16 1010  NA 140 140  K 4.5 4.5  CL 104 100*  CO2 31  --   BUN 13 17  CREATININE 1.03 1.00  CALCIUM 9.3  --   PROT 6.3*  --   BILITOT 0.7  --   ALKPHOS 48  --   ALT 46  --   AST 29  --  GLUCOSE 143* 139*    Discharge Medications:    Medication List    TAKE these medications   ACCU-CHEK COMPACT CARE KIT Kit as directed.   ACCU-CHEK COMPACT PLUS CONTROL Soln as directed.   ACCU-CHEK COMPACT PLUS test strip Generic drug:  glucose blood as directed.   ACCU-CHEK SOFTCLIX LANCETS lancets as directed.   aspirin EC 81 MG tablet Take 81 mg by mouth daily.   ezetimibe 10 MG tablet Commonly known as:  ZETIA Take 10 mg by mouth daily.   fluticasone 50 MCG/ACT nasal spray Commonly  known as:  FLONASE Place 1 spray into the nose daily.   fluticasone 50 MCG/ACT nasal spray Commonly known as:  FLONASE Place 1 spray into both nostrils daily as needed for allergies.   losartan 25 MG tablet Commonly known as:  COZAAR Take 25 mg by mouth daily.   MULTI-VITAMINS Tabs Take 1 tablet by mouth daily.   nitroGLYCERIN 0.4 MG SL tablet Commonly known as:  NITROSTAT Place 0.4 mg under the tongue every 5 (five) minutes as needed for chest pain.   RA VITAMIN B-12 TR 1000 MCG Tbcr Generic drug:  Cyanocobalamin Take 1 tablet by mouth daily.   rivastigmine 9.5 mg/24hr Commonly known as:  EXELON Place 9.5 mg onto the skin daily.   rosuvastatin 5 MG tablet Commonly known as:  CRESTOR Take 5 mg by mouth daily.   sertraline 50 MG tablet Commonly known as:  ZOLOFT Take 50 mg by mouth daily.   sildenafil 20 MG tablet Commonly known as:  REVATIO Take 20 mg by mouth daily as needed.   tamsulosin 0.4 MG Caps capsule Commonly known as:  FLOMAX Take 0.4 mg by mouth daily.       Disposition:  Discharge Instructions    Diet - low sodium heart healthy    Complete by:  As directed    Increase activity slowly    Complete by:  As directed      Follow-up Information    [facility] Follow up on 04/02/2016.   Specialty:  Cardiology Why:  at 4:30PM for wound check  Contact information: [address] [place]       Thompson Grayer, MD Follow up on 07/10/2016.   Specialty:  Cardiology Why:  at 11:50AM Contact information: [place] [address] [phone]           Duration of Discharge Encounter: Greater than 30 minutes including physician time.  Signed, Chanetta Marshall, NP 03/25/2016 7:58 AM   .I have seen, examined the patient, and reviewed the above assessment and plan.  Changes to above are made where necessary.   Device interrogation reviewed and normal.  CXR  reveals stable leads, no ptx.  DC to home with routine wound care and follow-up.  Co Sign: Thompson Grayer, MD 03/25/2016 8:37 PM

## 2016-03-24 NOTE — Interval H&P Note (Signed)
History and Physical Interval Note:  03/24/2016 11:35 AM  Joel Nelson  has presented today for surgery, with the diagnosis of sss  The various methods of treatment have been discussed with the patient and family. After consideration of risks, benefits and other options for treatment, the patient has consented to  Procedure(s): Pacemaker Implant (N/A) as a surgical intervention .  The patient's history has been reviewed, patient examined, no change in status, stable for surgery.  I have reviewed the patient's chart and labs.  Questions were answered to the patient's satisfaction.     Thompson Grayer

## 2016-03-24 NOTE — Code Documentation (Signed)
CPR started, this RN called for help.

## 2016-03-25 ENCOUNTER — Inpatient Hospital Stay (HOSPITAL_COMMUNITY): Payer: Medicare Other

## 2016-03-25 LAB — GLUCOSE, CAPILLARY
GLUCOSE-CAPILLARY: 136 mg/dL — AB (ref 65–99)
GLUCOSE-CAPILLARY: 113 mg/dL — AB (ref 65–99)
GLUCOSE-CAPILLARY: 99 mg/dL (ref 65–99)
Glucose-Capillary: 164 mg/dL — ABNORMAL HIGH (ref 65–99)

## 2016-03-25 NOTE — Progress Notes (Signed)
Reviewed d/c instructions with pt and wife. Reinforced minimal use of L arm. While getting dressed pt throwing arms over head. Reinforced again are movement restrictions. Tele monitor and I d/c'd. Volunteers called for a ride. No further questions. Call bell and phone within reach. Will continue to monitor.

## 2016-03-30 ENCOUNTER — Telehealth: Payer: Self-pay | Admitting: Internal Medicine

## 2016-03-30 NOTE — Telephone Encounter (Signed)
Spoke with wife extensively. She and her husband have moved to [place] and would like to establish with general cardiology there.  Scheduled appt with Dr Bronson Ing 05/11/16 to establish. She will keep appt with Dr Rayann Heman as scheduled. She is aware that we are always available if issues arise and would be happy to see sooner if necessary.   Chanetta Marshall, NP 03/30/2016 11:56 AM

## 2016-03-30 NOTE — Telephone Encounter (Signed)
Patient recently had device placed.   Wife is very concerned with why his follow-up has to be 3 months after the placement.  She has a lot of questions she would like to discuss with Dr Rayann Heman

## 2016-04-02 ENCOUNTER — Encounter: Payer: Self-pay | Admitting: Internal Medicine

## 2016-04-02 ENCOUNTER — Ambulatory Visit (INDEPENDENT_AMBULATORY_CARE_PROVIDER_SITE_OTHER): Payer: Medicare Other | Admitting: *Deleted

## 2016-04-02 DIAGNOSIS — R001 Bradycardia, unspecified: Secondary | ICD-10-CM | POA: Diagnosis not present

## 2016-04-02 DIAGNOSIS — Z959 Presence of cardiac and vascular implant and graft, unspecified: Secondary | ICD-10-CM | POA: Diagnosis not present

## 2016-04-02 LAB — CUP PACEART INCLINIC DEVICE CHECK
Brady Statistic RV Percent Paced: 0.07 %
Date Time Interrogation Session: 20171026173056
Implantable Lead Location: 753860
Lead Channel Pacing Threshold Amplitude: 0.5 V
Lead Channel Pacing Threshold Amplitude: 0.5 V
Lead Channel Pacing Threshold Pulse Width: 0.5 ms
Lead Channel Pacing Threshold Pulse Width: 0.5 ms
Lead Channel Sensing Intrinsic Amplitude: 9.7 mV
Lead Channel Setting Pacing Amplitude: 1.5 V
Lead Channel Setting Sensing Sensitivity: 2 mV
MDC IDC LEAD IMPLANT DT: 20171017
MDC IDC LEAD IMPLANT DT: 20171017
MDC IDC LEAD LOCATION: 753859
MDC IDC MSMT BATTERY REMAINING LONGEVITY: 151.2
MDC IDC MSMT BATTERY VOLTAGE: 3.07 V
MDC IDC MSMT LEADCHNL RA IMPEDANCE VALUE: 400 Ohm
MDC IDC MSMT LEADCHNL RA SENSING INTR AMPL: 2.8 mV
MDC IDC MSMT LEADCHNL RV IMPEDANCE VALUE: 550 Ohm
MDC IDC SET LEADCHNL RV PACING AMPLITUDE: 3.5 V
MDC IDC SET LEADCHNL RV PACING PULSEWIDTH: 0.5 ms
MDC IDC STAT BRADY RA PERCENT PACED: 0.1 %
Pulse Gen Model: 2272
Pulse Gen Serial Number: 7946710

## 2016-04-02 NOTE — Progress Notes (Signed)
Wound check appointment. Steri-strips removed. Wound without redness or edema. Incision edges approximated, wound well healed. Normal device function. Thresholds, sensing, and impedances consistent with implant measurements. Device programmed at 3.5V (RV) and with auto capture programmed on (RA) for extra safety margin until 3 month visit. Histogram distribution appropriate for patient and level of activity. 1 mode switch (<1%), AT, duration 6 sec. No high ventricular rates noted. Patient educated about wound care, arm mobility, lifting restrictions. ROV with JA/E on 07/10/16.

## 2016-05-11 ENCOUNTER — Ambulatory Visit (INDEPENDENT_AMBULATORY_CARE_PROVIDER_SITE_OTHER): Payer: Medicare Other | Admitting: Cardiovascular Disease

## 2016-05-11 ENCOUNTER — Encounter: Payer: Self-pay | Admitting: Cardiovascular Disease

## 2016-05-11 VITALS — BP 136/75 | HR 54 | Ht 68.5 in | Wt 186.4 lb

## 2016-05-11 DIAGNOSIS — R55 Syncope and collapse: Secondary | ICD-10-CM | POA: Diagnosis not present

## 2016-05-11 DIAGNOSIS — I2510 Atherosclerotic heart disease of native coronary artery without angina pectoris: Secondary | ICD-10-CM

## 2016-05-11 DIAGNOSIS — Z959 Presence of cardiac and vascular implant and graft, unspecified: Secondary | ICD-10-CM | POA: Diagnosis not present

## 2016-05-11 DIAGNOSIS — E7800 Pure hypercholesterolemia, unspecified: Secondary | ICD-10-CM

## 2016-05-11 DIAGNOSIS — I10 Essential (primary) hypertension: Secondary | ICD-10-CM

## 2016-05-11 NOTE — Patient Instructions (Signed)
Medication Instructions:  Continue all current medications.  Labwork: none  Testing/Procedures: none  Follow-Up: Your physician wants you to follow up in: 8 months.  You will receive a reminder letter in the mail one-two months in advance.  If you don't receive a letter, please call our office to schedule the follow up appointment   Any Other Special Instructions Will Be Listed Below (If Applicable).  If you need a refill on your cardiac medications before your next appointment, please call your pharmacy.

## 2016-05-11 NOTE — Progress Notes (Signed)
SUBJECTIVE: The patient is an 82 year old male here to establish cardiac care in [place]. He has a history of coronary artery disease with remote left circumflex coronary artery angioplasty without stent placement in 1995. He also has hyperlipidemia and hypertension. He has a history of syncope which was deemed orthostatic in nature.  Echocardiogram 01/22/16: Normal left ventricular size and thickness, LVEF 13-24%, grade 1 diastolic dysfunction, mild mitral and tricuspid regurgitation.  He ultimately underwent dual-chamber pacemaker placement for symptomatically bradycardia and syncope in October 2017 by Dr. Rayann Heman.   Review of Systems: As per "subjective", otherwise negative.  No Known Allergies  Current Outpatient Prescriptions  Medication Sig Dispense Refill  . ACCU-CHEK SOFTCLIX LANCETS lancets as directed.    Marland Kitchen aspirin EC 81 MG tablet Take 81 mg by mouth daily.    . Blood Glucose Calibration (ACCU-CHEK COMPACT PLUS CONTROL) SOLN as directed.    . Blood Glucose Monitoring Suppl (ACCU-CHEK COMPACT CARE KIT) KIT as directed.    . Cyanocobalamin (RA VITAMIN B-12 TR) 1000 MCG TBCR Take 1 tablet by mouth daily.    Marland Kitchen ezetimibe (ZETIA) 10 MG tablet Take 10 mg by mouth daily.    . fluticasone (FLONASE) 50 MCG/ACT nasal spray Place 1 spray into both nostrils daily as needed for allergies.    Marland Kitchen glucose blood (ACCU-CHEK COMPACT PLUS) test strip as directed.    Marland Kitchen losartan (COZAAR) 25 MG tablet Take 25 mg by mouth daily.    . Multiple Vitamin (MULTI-VITAMINS) TABS Take 1 tablet by mouth daily.    . nitroGLYCERIN (NITROSTAT) 0.4 MG SL tablet Place 0.4 mg under the tongue every 5 (five) minutes as needed for chest pain.     . rivastigmine (EXELON) 9.5 mg/24hr Place 9.5 mg onto the skin daily.    . rosuvastatin (CRESTOR) 5 MG tablet Take 5 mg by mouth daily.    . sertraline (ZOLOFT) 50 MG tablet Take 50 mg by mouth daily.    . sildenafil (REVATIO) 20 MG tablet Take 20 mg by mouth daily as  needed.    . tamsulosin (FLOMAX) 0.4 MG CAPS capsule Take 0.4 mg by mouth daily.     No current facility-administered medications for this visit.     Past Medical History:  Diagnosis Date  . Coronary artery disease 1995    remote left circumflex angioplasty without stent placement in 1995  . History of kidney stones   . History of shingles   . Hyperlipidemia   . Hypertension   . Melanoma ([place])   . Memory loss   . Pre-diabetes   . Presence of permanent cardiac pacemaker 03/24/2016    Past Surgical History:  Procedure Laterality Date  . EP IMPLANTABLE DEVICE N/A 03/24/2016   Procedure: Pacemaker Implant;  Surgeon: Thompson Grayer, MD;  Location: [facility];  Service: Cardiovascular;  Laterality: N/A;  . herniated disk repair    . INSERT / REPLACE / REMOVE PACEMAKER      Social History   Social History  . Marital status: Married    Spouse name: N/A  . Number of children: N/A  . Years of education: N/A   Occupational History  . Not on file.   Social History Main Topics  . Smoking status: Never Smoker  . Smokeless tobacco: Never Used  . Alcohol use No  . Drug use: No  . Sexual activity: Yes    Partners: Female     Comment: married   Other Topics Concern  .  Not on file   Social History Narrative  . No narrative on file     Vitals:   05/11/16 1016  BP: 136/75  Pulse: (!) 54  SpO2: 97%  Weight: 186 lb 6.4 oz (84.6 kg)  Height: 5' 8.5" (1.74 m)    PHYSICAL EXAM General: NAD HEENT: Normal. Neck: No JVD, no thyromegaly. Lungs: Clear to auscultation bilaterally with normal respiratory effort. CV: Nondisplaced PMI.  Bradycardic, regular rhythm, normal S1/S2, no S3/S4, no murmur. No pretibial or periankle edema.  No carotid bruit.   Abdomen: Soft, nontender, no distention.  Neurologic: Alert and oriented.  Psych: Normal affect. Skin: Normal. Musculoskeletal: No gross deformities.    ECG: Most recent ECG reviewed.      ASSESSMENT AND  PLAN: 1. CAD: Symptomatically stable. Continue aspirin and statin.  2. Hyperlipidemia: Continue Zetia and Crestor.  3. Hypertension: Controlled. No changes.  4. Symptomatic bradycardia and syncope s/p pacemaker: Stable with normal device function.  Dispo: fu August 2018.  Kate Sable, M.D., F.A.C.C.

## 2016-06-03 ENCOUNTER — Telehealth: Payer: Self-pay | Admitting: Internal Medicine

## 2016-06-03 NOTE — Telephone Encounter (Signed)
Mrs. Califf is calling because her husband Joel Nelson) is wearing a FitBit wristband and she wants to make sure that it won't interfere with his pacemaker. Please call. Thanks.

## 2016-06-03 NOTE — Telephone Encounter (Signed)
Spoke to patient's wife regarding patient's Fitbit. I told her that the Fitbit should be fine as long as he keeps it >12 inches away from his device. I gave wife the 800# to see if [employer] has any other specific instructions. Wife voiced understanding.

## 2016-07-10 ENCOUNTER — Ambulatory Visit (INDEPENDENT_AMBULATORY_CARE_PROVIDER_SITE_OTHER): Payer: Medicare Other | Admitting: Internal Medicine

## 2016-07-10 ENCOUNTER — Encounter: Payer: Self-pay | Admitting: Internal Medicine

## 2016-07-10 VITALS — BP 143/73 | HR 59 | Ht 68.0 in | Wt 187.0 lb

## 2016-07-10 DIAGNOSIS — R001 Bradycardia, unspecified: Secondary | ICD-10-CM

## 2016-07-10 DIAGNOSIS — I10 Essential (primary) hypertension: Secondary | ICD-10-CM

## 2016-07-10 DIAGNOSIS — I459 Conduction disorder, unspecified: Secondary | ICD-10-CM | POA: Diagnosis not present

## 2016-07-10 NOTE — Progress Notes (Signed)
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  Since last being seen in our clinic, the patient reports doing very well.  Today, he denies symptoms of palpitations, chest pain, shortness of breath,  lower extremity edema, dizziness, presyncope, or syncope.  The patient is otherwise without complaint today.   Past Medical History:  Diagnosis Date  . Coronary artery disease 1995    remote left circumflex angioplasty without stent placement in 1995  . History of kidney stones   . History of shingles   . Hyperlipidemia   . Hypertension   . Melanoma ([place])   . Memory loss   . Pre-diabetes   . Presence of permanent cardiac pacemaker 03/24/2016   Past Surgical History:  Procedure Laterality Date  . EP IMPLANTABLE DEVICE N/A 03/24/2016   Procedure: Pacemaker Implant;  Surgeon: Thompson Grayer, MD;  Location: [facility];  Service: Cardiovascular;  Laterality: N/A;  . herniated disk repair    . INSERT / REPLACE / REMOVE PACEMAKER      ROS- all systems are reviewed and negative except as per HPI above  Current Outpatient Prescriptions  Medication Sig Dispense Refill  . ACCU-CHEK SOFTCLIX LANCETS lancets as directed.    Marland Kitchen aspirin EC 81 MG tablet Take 81 mg by mouth daily.    . Blood Glucose Calibration (ACCU-CHEK COMPACT PLUS CONTROL) SOLN as directed.    . Blood Glucose Monitoring Suppl (ACCU-CHEK COMPACT CARE KIT) KIT as directed.    . Cyanocobalamin (RA VITAMIN B-12 TR) 1000 MCG TBCR Take 1 tablet by mouth daily.    Marland Kitchen ezetimibe (ZETIA) 10 MG tablet Take 10 mg by mouth daily.    . fluticasone (FLONASE) 50 MCG/ACT nasal spray Place 1 spray into both nostrils daily as needed for allergies.    Marland Kitchen glucose blood (ACCU-CHEK COMPACT PLUS) test strip as directed.    Marland Kitchen losartan (COZAAR) 25 MG tablet Take 25 mg by mouth daily.    . Multiple Vitamin (MULTI-VITAMINS) TABS Take 1 tablet by  mouth daily.    . nitroGLYCERIN (NITROSTAT) 0.4 MG SL tablet Place 0.4 mg under the tongue every 5 (five) minutes as needed for chest pain.     . rivastigmine (EXELON) 9.5 mg/24hr Place 9.5 mg onto the skin daily.    . rosuvastatin (CRESTOR) 5 MG tablet Take 5 mg by mouth daily.    . sertraline (ZOLOFT) 50 MG tablet Take 50 mg by mouth daily.    . tamsulosin (FLOMAX) 0.4 MG CAPS capsule Take 0.4 mg by mouth daily.     No current facility-administered medications for this visit.     Physical Exam: Vitals:   07/10/16 1200  BP: (!) 143/73  Pulse: (!) 59  SpO2: 95%  Weight: 187 lb (84.8 kg)  Height: '5\' 8"'  (1.727 m)    GEN- The patient is well appearing, alert and oriented x 3 today.   Head- normocephalic, atraumatic Eyes-  Sclera clear, conjunctiva pink Ears- hearing intact Oropharynx- clear Lungs- Clear to ausculation bilaterally, normal work of breathing Chest- pacemaker pocket is well healed Heart- Regular rate and rhythm, no murmurs, rubs or gallops, PMI not laterally displaced GI- soft, NT, ND, + BS Extremities- no clubbing, cyanosis, or edema  Pacemaker interrogation- reviewed in detail today,  See PACEART report  Assessment and Plan:  1. Sick sinus syndrome with syncope Normal pacemaker function Leads, battery stable No arrhythmias See Claudia Desanctis Art  report No changes today  2. HTN Stable No change required today  Merlin Return to see me in 1 year  Thompson Grayer MD, [facility] 07/10/2016 12:36 PM

## 2016-07-10 NOTE — Patient Instructions (Signed)
Medication Instructions:  Continue all current medications.  Labwork: none  Testing/Procedures: none  Follow-Up: Your physician wants you to follow up in:  1 year.  You will receive a reminder letter in the mail one-two months in advance.  If you don't receive a letter, please call our office to schedule the follow up appointment.  DR. Rayann Heman   Any Other Special Instructions Will Be Listed Below (If Applicable). Remote monitoring is used to monitor your Pacemaker of ICD from home. This monitoring reduces the number of office visits required to check your device to one time per year. It allows [place] to keep an eye on the functioning of your device to ensure it is working properly. You are scheduled for a device check from home on 10/12/2016. You may send your transmission at any time that day. If you have a wireless device, the transmission will be sent automatically. After your physician reviews your transmission, you will receive a postcard with your next transmission date.  If you need a refill on your cardiac medications before your next appointment, please call your pharmacy.

## 2016-07-15 LAB — CUP PACEART INCLINIC DEVICE CHECK
Brady Statistic RA Percent Paced: 0.09 %
Brady Statistic RV Percent Paced: 0.05 %
Date Time Interrogation Session: 20180202180841
Implantable Lead Implant Date: 20171017
Implantable Lead Location: 753859
Lead Channel Impedance Value: 400 Ohm
Lead Channel Pacing Threshold Amplitude: 0.5 V
Lead Channel Pacing Threshold Pulse Width: 0.5 ms
Lead Channel Pacing Threshold Pulse Width: 0.5 ms
Lead Channel Setting Pacing Amplitude: 2.5 V
MDC IDC LEAD IMPLANT DT: 20171017
MDC IDC LEAD LOCATION: 753860
MDC IDC MSMT BATTERY VOLTAGE: 3.02 V
MDC IDC MSMT LEADCHNL RA SENSING INTR AMPL: 3.5 mV
MDC IDC MSMT LEADCHNL RV IMPEDANCE VALUE: 587.5 Ohm
MDC IDC MSMT LEADCHNL RV PACING THRESHOLD AMPLITUDE: 0.75 V
MDC IDC MSMT LEADCHNL RV PACING THRESHOLD AMPLITUDE: 0.75 V
MDC IDC MSMT LEADCHNL RV PACING THRESHOLD PULSEWIDTH: 0.5 ms
MDC IDC MSMT LEADCHNL RV SENSING INTR AMPL: 10 mV
MDC IDC PG IMPLANT DT: 20171017
MDC IDC PG SERIAL: 7946710
MDC IDC SET LEADCHNL RA PACING AMPLITUDE: 1.5 V
MDC IDC SET LEADCHNL RV PACING PULSEWIDTH: 0.5 ms
MDC IDC SET LEADCHNL RV SENSING SENSITIVITY: 2 mV

## 2016-10-12 ENCOUNTER — Ambulatory Visit (INDEPENDENT_AMBULATORY_CARE_PROVIDER_SITE_OTHER): Payer: Medicare Other | Admitting: *Deleted

## 2016-10-12 DIAGNOSIS — R001 Bradycardia, unspecified: Secondary | ICD-10-CM | POA: Diagnosis not present

## 2016-10-13 LAB — CUP PACEART REMOTE DEVICE CHECK
Brady Statistic AP VP Percent: 1 %
Brady Statistic AP VS Percent: 1 %
Brady Statistic AS VP Percent: 1 %
Brady Statistic RA Percent Paced: 1 %
Brady Statistic RV Percent Paced: 1 %
Date Time Interrogation Session: 20180507060013
Implantable Lead Implant Date: 20171017
Implantable Lead Location: 753860
Lead Channel Impedance Value: 400 Ohm
Lead Channel Pacing Threshold Amplitude: 0.375 V
Lead Channel Pacing Threshold Amplitude: 0.75 V
Lead Channel Pacing Threshold Pulse Width: 0.5 ms
Lead Channel Sensing Intrinsic Amplitude: 3.2 mV
Lead Channel Sensing Intrinsic Amplitude: 9.6 mV
Lead Channel Setting Pacing Amplitude: 1.375
MDC IDC LEAD IMPLANT DT: 20171017
MDC IDC LEAD LOCATION: 753859
MDC IDC MSMT BATTERY REMAINING LONGEVITY: 131 mo
MDC IDC MSMT BATTERY REMAINING PERCENTAGE: 95.5 %
MDC IDC MSMT BATTERY VOLTAGE: 3.02 V
MDC IDC MSMT LEADCHNL RV IMPEDANCE VALUE: 510 Ohm
MDC IDC MSMT LEADCHNL RV PACING THRESHOLD PULSEWIDTH: 0.5 ms
MDC IDC PG IMPLANT DT: 20171017
MDC IDC SET LEADCHNL RV PACING AMPLITUDE: 2.5 V
MDC IDC SET LEADCHNL RV PACING PULSEWIDTH: 0.5 ms
MDC IDC SET LEADCHNL RV SENSING SENSITIVITY: 2 mV
MDC IDC STAT BRADY AS VS PERCENT: 99 %
Pulse Gen Model: 2272
Pulse Gen Serial Number: 7946710

## 2016-10-13 NOTE — Progress Notes (Signed)
Remote pacemaker transmission.   

## 2016-10-16 ENCOUNTER — Encounter: Payer: Self-pay | Admitting: Cardiovascular Disease

## 2016-10-16 ENCOUNTER — Encounter: Payer: Self-pay | Admitting: Cardiology

## 2016-10-22 ENCOUNTER — Telehealth: Payer: Self-pay | Admitting: *Deleted

## 2016-10-22 ENCOUNTER — Telehealth: Payer: Self-pay | Admitting: Internal Medicine

## 2016-10-22 NOTE — Telephone Encounter (Signed)
Probably warrants neuroimaging even without typical TIA/CVA symptoms. Would recommend he see neuro.

## 2016-10-22 NOTE — Telephone Encounter (Signed)
Reviewed remote transmission with patient's wife and explained that there were no coinciding episodes that would indicate a cause of his symptoms. She stated that she would call his primary cardiologist and make an attempt to move his appointment to an earlier date.

## 2016-10-22 NOTE — Telephone Encounter (Signed)
Spoke with patient's wife and walked her through how to send a manual transmission. I will review transmission once received and call her back. She is agreeable to this plan.

## 2016-10-22 NOTE — Telephone Encounter (Signed)
Wife [name]) calling with concerns about episode husband had yesterday.  Had episode around noon where he felt very weak, nauseated, color pale.  After the episode, she noticed a huge change in his memory.  Stated that he does have known issues with memory & has been on medication for the last 2 years.  They were out of town at that time, so came home right afterward and saw Dr. Pleas Koch yesterday evening.  Stated that he really did not know what had happened, possibly thought that he could have had TIA or an issue with his pacemaker.  PMD suggest she contact his cardiologist about these symptoms.  Wife did call [employer] office this morning & device was checked without any coinciding episodes that would cause the symptoms he was experiencing.  Stated that he never had any symptoms suggestive of stroke - no slurred speech, weakness on one side of body, facial drooping, of difficulty walking.  Stated today that his confusion is a little better, but has not been able to recall what happened yesterday.  No c/o chest pain, dizziness or SOB.  Message fwd to provider.

## 2016-10-22 NOTE — Telephone Encounter (Signed)
New message      Wife states that yesterday pt had an episode of confusion, weakness and nausea.  It lasted for about 5-10 minutes. Afterwards, pt looked dazed and did not feel well at all.  This am, he did not remember much of anything yesterday.  Pt saw the family doctor yesterday but checked out fine.  Wife states that pt had these same symptoms prior to getting his pacemaker.  Should he do a transmission?  Please advise.

## 2016-10-22 NOTE — Telephone Encounter (Signed)
Patient notified.  Looks like he has seen neuro in the past (Dr. Aviva Kluver).  She is with [facility] he had only been seeing her for memory.  Says she is out on maternity leave currently.  Suggested that she call to get appointment since he is already an established patient there & they know his history.  Should have someone covering her patient's while she is out.  Informed her that if she is unable to get in with them to call back & we can assist with new referral to neurology in [place].  Wife verbalized understanding.

## 2016-10-28 ENCOUNTER — Encounter: Payer: Self-pay | Admitting: Cardiovascular Disease

## 2016-10-28 ENCOUNTER — Ambulatory Visit (INDEPENDENT_AMBULATORY_CARE_PROVIDER_SITE_OTHER): Payer: Medicare Other | Admitting: Cardiovascular Disease

## 2016-10-28 VITALS — BP 128/70 | HR 63 | Ht 68.5 in | Wt 183.0 lb

## 2016-10-28 DIAGNOSIS — Z959 Presence of cardiac and vascular implant and graft, unspecified: Secondary | ICD-10-CM

## 2016-10-28 DIAGNOSIS — E782 Mixed hyperlipidemia: Secondary | ICD-10-CM | POA: Diagnosis not present

## 2016-10-28 DIAGNOSIS — I2510 Atherosclerotic heart disease of native coronary artery without angina pectoris: Secondary | ICD-10-CM | POA: Diagnosis not present

## 2016-10-28 DIAGNOSIS — R531 Weakness: Secondary | ICD-10-CM | POA: Diagnosis not present

## 2016-10-28 DIAGNOSIS — E7800 Pure hypercholesterolemia, unspecified: Secondary | ICD-10-CM

## 2016-10-28 DIAGNOSIS — R410 Disorientation, unspecified: Secondary | ICD-10-CM | POA: Diagnosis not present

## 2016-10-28 DIAGNOSIS — Z9289 Personal history of other medical treatment: Secondary | ICD-10-CM | POA: Diagnosis not present

## 2016-10-28 DIAGNOSIS — I459 Conduction disorder, unspecified: Secondary | ICD-10-CM

## 2016-10-28 DIAGNOSIS — I10 Essential (primary) hypertension: Secondary | ICD-10-CM | POA: Diagnosis not present

## 2016-10-28 NOTE — Progress Notes (Signed)
SUBJECTIVE: The patient presents for follow-up. His wife had called about 5 days ago stating the patient had an episode around noon where he felt weak, nauseated, and was pale in color. He also had a significant change in his memory shortly thereafter. Device was interrogated and was found be functioning normally and could not find any specific cardiac etiology for his symptoms. There is no evidence of slurred speech, facial drooping, or hemiparesis. I recommended that they get in touch with a neurologist. He has seen Dr. Aviva Kluver in the past.  He was evaluated in the ED at [facility]. I reviewed all relevant documentation, labs, and studies. Neurology was also consulted in the ED who did not recommend any neurological intervention. It was thought that he had an exacerbation of underlying dementia in the setting of dehydration after a prolonged period of time in hot vehicle.  Chest x-ray was normal. Head CT showed no evidence of acute intracranial abnormalities. 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  Pertinent labs: Sodium 140, potassium 4.6, BUN 12, creatinine 0.87, normal urinalysis, normal troponin, white blood cells 6.7, hemoglobin 15.4, platelets 210.  He has a history of coronary artery disease with remote left circumflex coronary artery angioplasty without stent placement in 1995. He also has hyperlipidemia and hypertension. He has a history of syncope which was deemed orthostatic in nature.  Echocardiogram 01/22/16: Normal left ventricular size and thickness, LVEF 99-37%, grade 1 diastolic dysfunction, mild mitral and tricuspid regurgitation.  He ultimately underwent dual-chamber pacemaker placement for symptomatically bradycardia and syncope in October 2017 by Dr. Rayann Heman.  He has had episodic weakness sporadically over the past several months. There has been no clear etiology. He apparently has borderline diabetes and  this is being managed with diet.  Pacemaker interrogation on 10/12/16 was normal. The lower rate is set at 50 and the upper rate set at 120.  He denies chest pain, palpitations, leg swelling, orthopnea, and shortness of breath.  There was supposed to make a trip to [place], [place] tomorrow but they canceled it. They enjoy traveling together and have been to [place], [place], and [place].  Social history: He and his wife have been married for 20 years. They are both widows. They were high school sweethearts. 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 They have an active faith. Her son is a Theme park manager.      Review of Systems: As per "subjective", otherwise negative.  No Known Allergies  Current Outpatient Prescriptions  Medication Sig Dispense Refill  . ACCU-CHEK SOFTCLIX LANCETS lancets as directed.    Marland Kitchen aspirin EC 81 MG tablet Take 81 mg by mouth daily.    . Blood Glucose Calibration (ACCU-CHEK COMPACT PLUS CONTROL) SOLN as directed.    . Blood Glucose Monitoring Suppl (ACCU-CHEK COMPACT CARE KIT) KIT as directed.    . Cyanocobalamin (RA VITAMIN B-12 TR) 1000 MCG TBCR Take 1 tablet by mouth daily.    Marland Kitchen ezetimibe (ZETIA) 10 MG tablet Take 10 mg by mouth daily.    . fluticasone (FLONASE) 50 MCG/ACT nasal spray Place 1 spray into both nostrils daily as needed for allergies.    Marland Kitchen glucose blood (ACCU-CHEK COMPACT PLUS) test strip as directed.    Marland Kitchen losartan (COZAAR) 25 MG tablet Take 25 mg by mouth daily.    . Multiple Vitamin (MULTI-VITAMINS) TABS Take 1 tablet by mouth daily.    . nitroGLYCERIN (NITROSTAT) 0.4 MG SL tablet  Place 0.4 mg under the tongue every 5 (five) minutes as needed for chest pain.     . rivastigmine (EXELON) 9.5 mg/24hr Place 9.5 mg onto the skin daily.    . rosuvastatin (CRESTOR) 5 MG tablet Take 5 mg by mouth daily.    . sertraline (ZOLOFT) 50 MG tablet Take 50 mg by mouth daily.    . tamsulosin (FLOMAX) 0.4 MG CAPS capsule Take 0.4 mg by mouth daily.     No current  facility-administered medications for this visit.     Past Medical History:  Diagnosis Date  . Coronary artery disease 1995    remote left circumflex angioplasty without stent placement in 1995  . History of kidney stones   . History of shingles   . Hyperlipidemia   . Hypertension   . Melanoma ([place])   . Memory loss   . Pre-diabetes   . Presence of permanent cardiac pacemaker 03/24/2016    Past Surgical History:  Procedure Laterality Date  . EP IMPLANTABLE DEVICE N/A 03/24/2016   SJM Assurity MRI PPM implanted by Dr Rayann Heman for sick sinus syndrome  . herniated disk repair      Social History   Social History  . Marital status: Married    Spouse name: N/A  . Number of children: N/A  . Years of education: N/A   Occupational History  . Not on file.   Social History Main Topics  . Smoking status: Never Smoker  . Smokeless tobacco: Never Used  . Alcohol use No  . Drug use: No  . Sexual activity: Yes    Partners: Female     Comment: married   Other Topics Concern  . Not on file   Social History Narrative  . No narrative on file     Vitals:   10/28/16 1253  BP: 128/70  Pulse: 63  SpO2: 95%  Weight: 183 lb (83 kg)  Height: 5' 8.5" (1.74 m)    Wt Readings from Last 3 Encounters:  10/28/16 183 lb (83 kg)  07/10/16 187 lb (84.8 kg)  05/11/16 186 lb 6.4 oz (84.6 kg)     PHYSICAL EXAM General: NAD HEENT: Normal. Neck: No JVD, no thyromegaly. Lungs: Clear to auscultation bilaterally with normal respiratory effort. CV: Nondisplaced PMI.  Regular rate and rhythm, normal S1/S2, no S3/S4, no murmur. No pretibial or periankle edema.  No carotid bruit.   Abdomen: Soft, nontender, no distention.  Neurologic: Alert and oriented.  Psych: Normal affect. Skin: Normal. Musculoskeletal: No gross deformities.    ECG: Most recent ECG reviewed.   Labs: Lab Results  Component Value Date/Time   K 4.5 03/24/2016 10:10 AM   BUN 17 03/24/2016 10:10 AM   CREATININE  1.00 03/24/2016 10:10 AM   ALT 46 03/24/2016 10:00 AM   TSH 2.350 03/24/2016 09:38 AM   HGB 16.0 03/24/2016 10:10 AM     Lipids: No results found for: LDLCALC, LDLDIRECT, CHOL, TRIG, HDL     ASSESSMENT AND PLAN: 1. CAD: Symptomatically stable. Continue aspirin and statin.  2. Hyperlipidemia: Continue Zetia and Crestor.  3. Hypertension: Controlled on present therapy. No changes.  4. Symptomatic bradycardia and syncope s/p pacemaker: Normal device function by recent interrogation. Follows with Dr. Rayann Heman.  5. Episodic weakness and confusion: No further episodes. Unclear etiology. This may have been neurologic. His neurologist is currently on leave.   Time spent: 40 minutes, of which greater than 50% was spent reviewing symptoms, relevant blood tests and studies, and discussing management  plan with the patient.   Disposition: Follow up 6 months  Kate Sable, M.D., F.A.C.C.

## 2016-10-28 NOTE — Patient Instructions (Signed)
Your physician wants you to follow-up in: 6 months with Dr Koneswaran You will receive a reminder letter in the mail two months in advance. If you don't receive a letter, please call our office to schedule the follow-up appointment.    Your physician recommends that you continue on your current medications as directed. Please refer to the Current Medication list given to you today.      If you need a refill on your cardiac medications before your next appointment, please call your pharmacy.      Thank you for choosing [facility] !        

## 2016-10-30 IMAGING — CR DG CHEST 2V
2 series · 2 of 2 positions shown · non-contrast
Comparison: 03/24/2016

CLINICAL DATA: Cardiac device in situ

EXAM:
CHEST  2 VIEW

[chest pa]
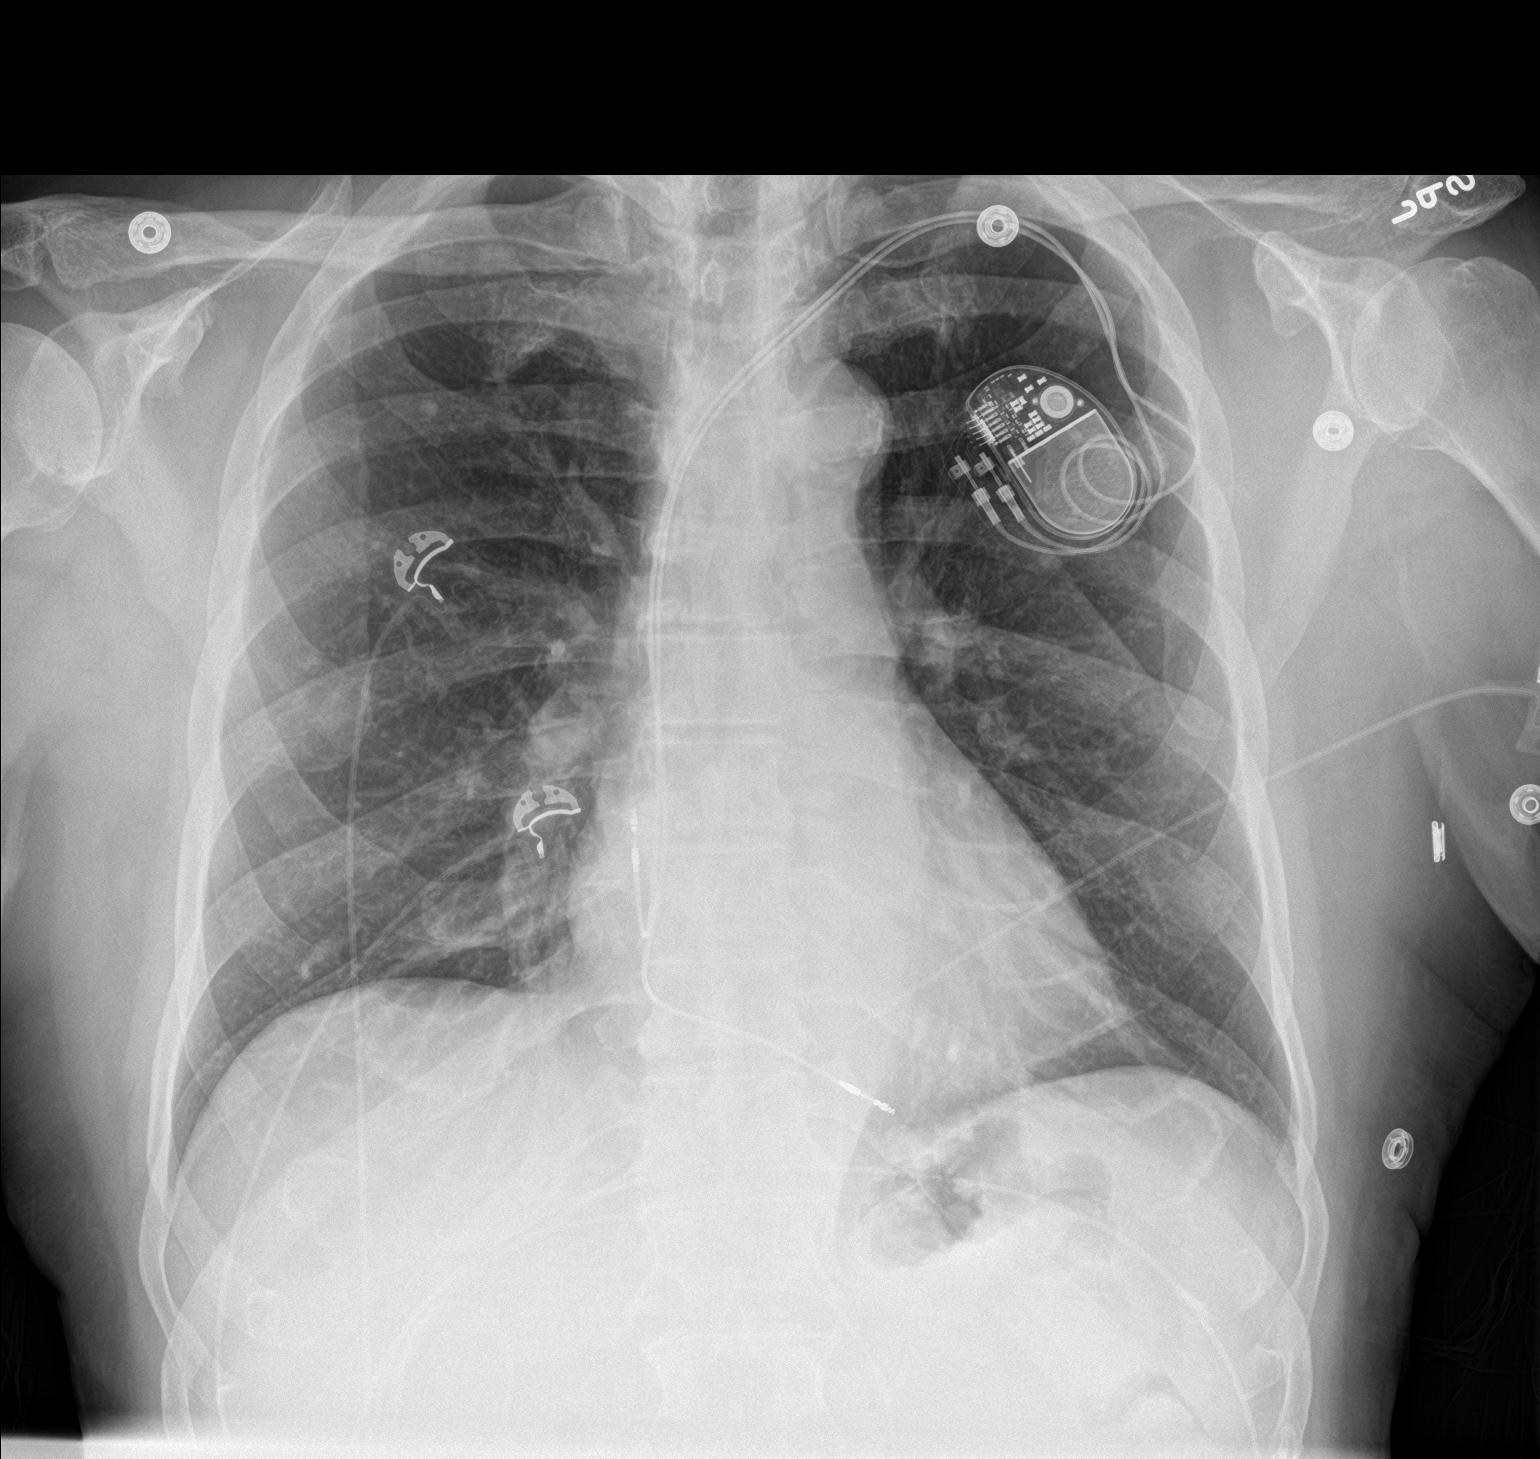

[chest lat]
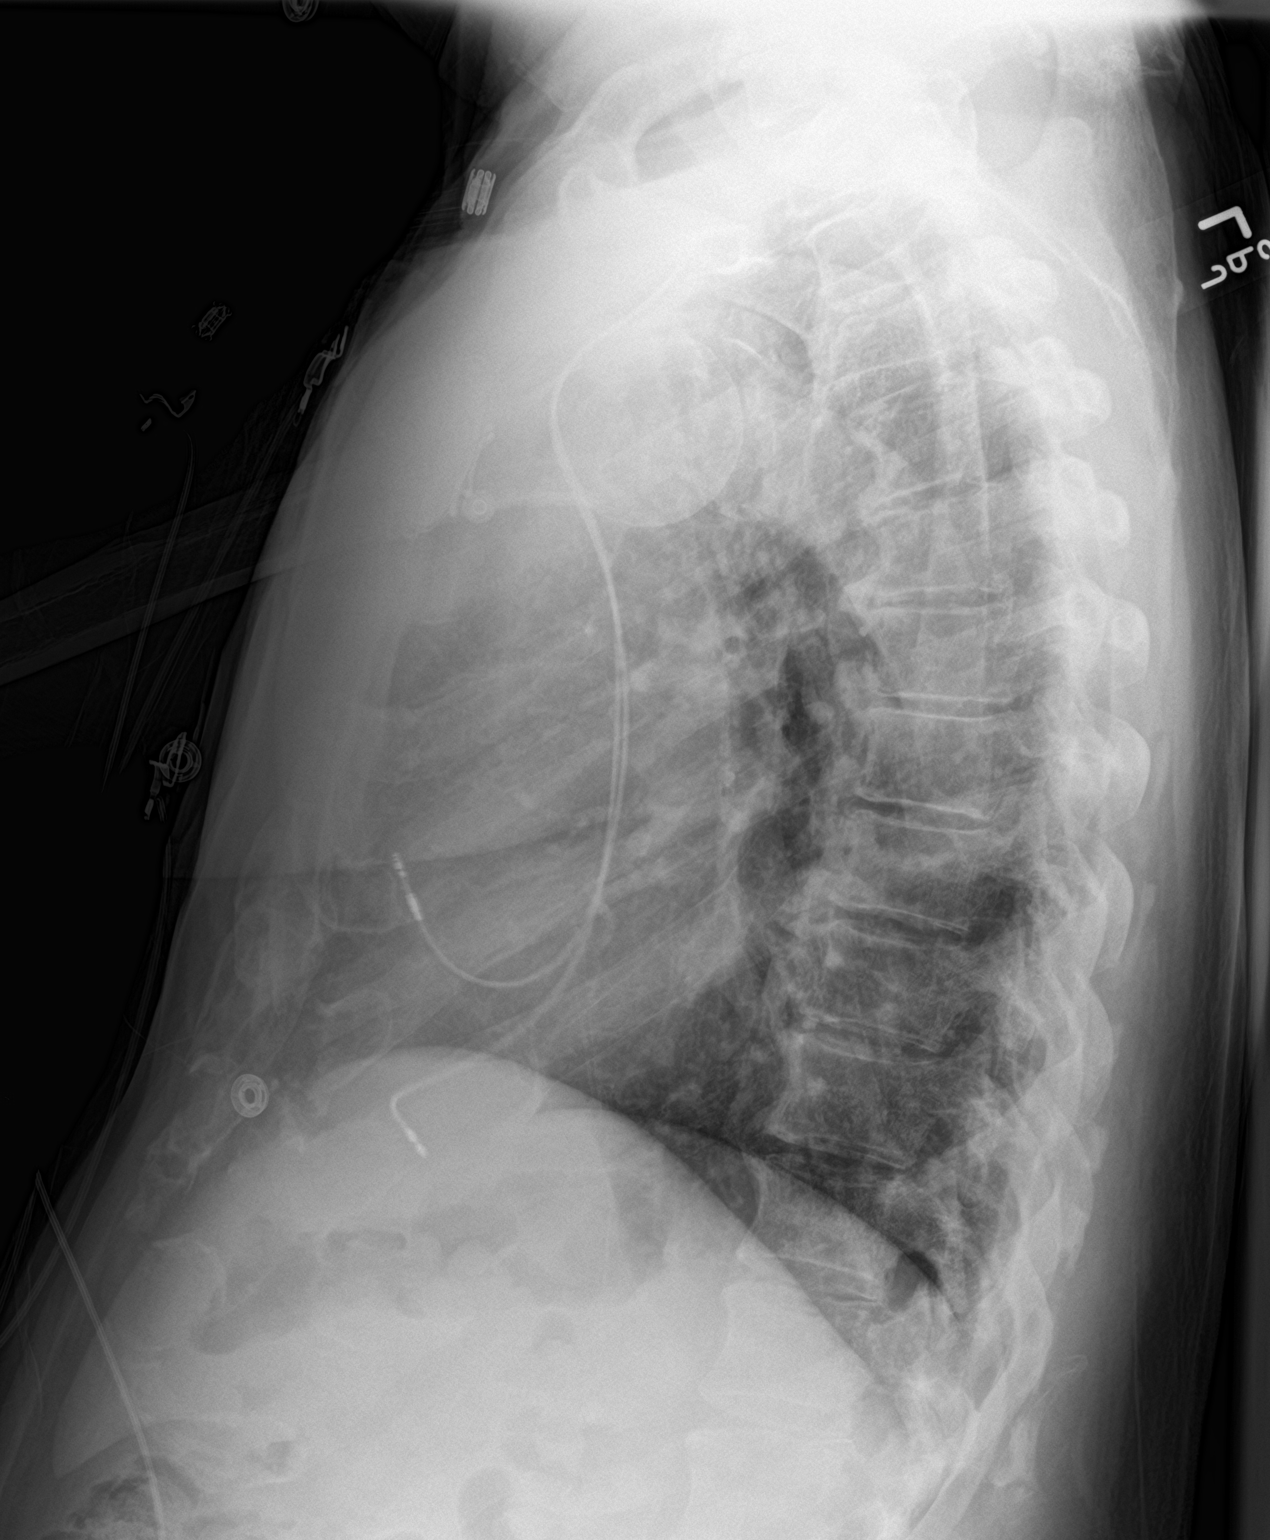

[2 of 2 positions shown; findings below may reference images not displayed]

FINDINGS: Interval placement of left subclavian central venous catheter. Leads
in the right atrium and right ventricle in good position. No
pneumothorax

Lungs are clear without infiltrate or effusion. Negative for heart
failure.
IMPRESSION: Satisfactory placement of dual lead transvenous pacemaker.

## 2017-01-05 ENCOUNTER — Ambulatory Visit: Payer: Medicare Other | Admitting: Cardiovascular Disease

## 2017-01-11 ENCOUNTER — Ambulatory Visit (INDEPENDENT_AMBULATORY_CARE_PROVIDER_SITE_OTHER): Payer: Medicare Other | Admitting: *Deleted

## 2017-01-11 DIAGNOSIS — R001 Bradycardia, unspecified: Secondary | ICD-10-CM

## 2017-01-12 NOTE — Progress Notes (Signed)
Remote pacemaker transmission.   

## 2017-01-13 ENCOUNTER — Encounter: Payer: Self-pay | Admitting: Cardiology

## 2017-01-15 LAB — CUP PACEART REMOTE DEVICE CHECK
Battery Remaining Longevity: 131 mo
Battery Remaining Percentage: 95.5 %
Battery Voltage: 3.01 V
Brady Statistic AS VP Percent: 1 %
Brady Statistic RA Percent Paced: 1 %
Implantable Lead Implant Date: 20171017
Implantable Lead Location: 753860
Implantable Pulse Generator Implant Date: 20171017
Lead Channel Impedance Value: 410 Ohm
Lead Channel Pacing Threshold Amplitude: 0.375 V
Lead Channel Pacing Threshold Pulse Width: 0.5 ms
Lead Channel Sensing Intrinsic Amplitude: 3.3 mV
MDC IDC LEAD IMPLANT DT: 20171017
MDC IDC LEAD LOCATION: 753859
MDC IDC MSMT LEADCHNL RV IMPEDANCE VALUE: 530 Ohm
MDC IDC MSMT LEADCHNL RV PACING THRESHOLD AMPLITUDE: 0.75 V
MDC IDC MSMT LEADCHNL RV PACING THRESHOLD PULSEWIDTH: 0.5 ms
MDC IDC MSMT LEADCHNL RV SENSING INTR AMPL: 9.5 mV
MDC IDC PG SERIAL: 7946710
MDC IDC SESS DTM: 20180806073134
MDC IDC SET LEADCHNL RA PACING AMPLITUDE: 1.375
MDC IDC SET LEADCHNL RV PACING AMPLITUDE: 2.5 V
MDC IDC SET LEADCHNL RV PACING PULSEWIDTH: 0.5 ms
MDC IDC SET LEADCHNL RV SENSING SENSITIVITY: 2 mV
MDC IDC STAT BRADY AP VP PERCENT: 1 %
MDC IDC STAT BRADY AP VS PERCENT: 1 %
MDC IDC STAT BRADY AS VS PERCENT: 99 %
MDC IDC STAT BRADY RV PERCENT PACED: 1 %

## 2017-03-29 ENCOUNTER — Telehealth: Payer: Self-pay | Admitting: Internal Medicine

## 2017-03-29 NOTE — Telephone Encounter (Signed)
Spoke with Joel Nelson informed her that I am not familiar with those tests that the neurologist has ordered, Joel Nelson stated that the neurologist is at [facility] and was aware that pt had a pacemaker prior to ordering the test. Gave Joel Nelson number to [facility] in order for the neurologist to call and verify that tests she has ordered will not interfere with pacemaker.

## 2017-03-29 NOTE — Telephone Encounter (Signed)
New message     Pt is ordered to have these tests done through neurology: will it interfere with his pacemaker?   Sleep deprive deg cus tte surface complete echo adult TCD /cus  EEG  Carotid w/transcranial ultrasound

## 2017-04-12 ENCOUNTER — Ambulatory Visit (INDEPENDENT_AMBULATORY_CARE_PROVIDER_SITE_OTHER): Payer: Medicare Other | Admitting: *Deleted

## 2017-04-12 ENCOUNTER — Telehealth: Payer: Self-pay | Admitting: *Deleted

## 2017-04-12 DIAGNOSIS — I495 Sick sinus syndrome: Secondary | ICD-10-CM | POA: Diagnosis not present

## 2017-04-12 NOTE — Progress Notes (Signed)
Remote pacemaker transmission.   

## 2017-04-12 NOTE — Telephone Encounter (Signed)
Mr. Kennard transmission not received. Unable to send transmission- sent to tech support for [employer].

## 2017-04-12 NOTE — Telephone Encounter (Signed)
Joel Nelson returning call

## 2017-04-12 NOTE — Telephone Encounter (Signed)
Transmission received. Normal device function. Joel Nelson aware.

## 2017-04-14 ENCOUNTER — Encounter: Payer: Self-pay | Admitting: Cardiology

## 2017-04-27 LAB — CUP PACEART REMOTE DEVICE CHECK
Brady Statistic AP VP Percent: 1 %
Brady Statistic AS VP Percent: 1 %
Brady Statistic AS VS Percent: 99 %
Implantable Lead Implant Date: 20171017
Implantable Lead Location: 753859
Lead Channel Impedance Value: 490 Ohm
Lead Channel Pacing Threshold Pulse Width: 0.5 ms
Lead Channel Sensing Intrinsic Amplitude: 8.7 mV
Lead Channel Setting Pacing Amplitude: 1.375
Lead Channel Setting Pacing Amplitude: 2.5 V
Lead Channel Setting Pacing Pulse Width: 0.5 ms
Lead Channel Setting Sensing Sensitivity: 2 mV
MDC IDC LEAD IMPLANT DT: 20171017
MDC IDC LEAD LOCATION: 753860
MDC IDC MSMT BATTERY REMAINING LONGEVITY: 128 mo
MDC IDC MSMT BATTERY REMAINING PERCENTAGE: 95.5 %
MDC IDC MSMT BATTERY VOLTAGE: 3.01 V
MDC IDC MSMT LEADCHNL RA IMPEDANCE VALUE: 400 Ohm
MDC IDC MSMT LEADCHNL RA PACING THRESHOLD AMPLITUDE: 0.375 V
MDC IDC MSMT LEADCHNL RA PACING THRESHOLD PULSEWIDTH: 0.5 ms
MDC IDC MSMT LEADCHNL RA SENSING INTR AMPL: 4.1 mV
MDC IDC MSMT LEADCHNL RV PACING THRESHOLD AMPLITUDE: 0.75 V
MDC IDC PG IMPLANT DT: 20171017
MDC IDC PG SERIAL: 7946710
MDC IDC SESS DTM: 20181105191742
MDC IDC STAT BRADY AP VS PERCENT: 1 %
MDC IDC STAT BRADY RA PERCENT PACED: 1 %
MDC IDC STAT BRADY RV PERCENT PACED: 1 %

## 2017-05-03 ENCOUNTER — Encounter: Payer: Self-pay | Admitting: Cardiovascular Disease

## 2017-05-03 ENCOUNTER — Ambulatory Visit: Payer: Medicare Other | Admitting: Cardiovascular Disease

## 2017-05-03 VITALS — BP 119/72 | HR 59 | Ht 68.5 in | Wt 187.8 lb

## 2017-05-03 DIAGNOSIS — I10 Essential (primary) hypertension: Secondary | ICD-10-CM

## 2017-05-03 DIAGNOSIS — I495 Sick sinus syndrome: Secondary | ICD-10-CM

## 2017-05-03 DIAGNOSIS — E782 Mixed hyperlipidemia: Secondary | ICD-10-CM | POA: Diagnosis not present

## 2017-05-03 DIAGNOSIS — I25118 Atherosclerotic heart disease of native coronary artery with other forms of angina pectoris: Secondary | ICD-10-CM | POA: Diagnosis not present

## 2017-05-03 DIAGNOSIS — Z959 Presence of cardiac and vascular implant and graft, unspecified: Secondary | ICD-10-CM | POA: Diagnosis not present

## 2017-05-03 NOTE — Patient Instructions (Signed)

## 2017-05-03 NOTE — Progress Notes (Signed)
SUBJECTIVE: The patient presents for routine follow-up.  He is feeling well and denies chest pain, palpitations, leg swelling, and shortness of breath.  He had an incident in October where he attended a funeral with his wife and after he got home he had no recollection of the event.  He saw his PCP and then his neurologist.  His wife tells me he is scheduled to undergo a CT angiography of the neck and brain.  An echocardiogram which was recently performed on 04/07/17 demonstrated normal left ventricular systolic function, LVEF 16-57%, diastolic dysfunction, and mild mitral and tricuspid valvular regurgitation.  Carotid Dopplers showed no significant stenosis.  ECG performed in the office today which I ordered and personally interpreted demonstrates normal sinus rhythm with no ischemic ST segment or T-wave abnormalities, nor any arrhythmias.     Review of Systems: As per "subjective", otherwise negative.  No Known Allergies  Current Outpatient Medications  Medication Sig Dispense Refill  . ACCU-CHEK SOFTCLIX LANCETS lancets as directed.    Marland Kitchen aspirin EC 81 MG tablet Take 81 mg by mouth daily.    . Blood Glucose Calibration (ACCU-CHEK COMPACT PLUS CONTROL) SOLN as directed.    . Blood Glucose Monitoring Suppl (ACCU-CHEK COMPACT CARE KIT) KIT as directed.    . Cyanocobalamin (RA VITAMIN B-12 TR) 1000 MCG TBCR Take 1 tablet by mouth daily.    Marland Kitchen ezetimibe (ZETIA) 10 MG tablet Take 10 mg by mouth daily.    . fluticasone (FLONASE) 50 MCG/ACT nasal spray Place 1 spray into both nostrils daily as needed for allergies.    Marland Kitchen glucose blood (ACCU-CHEK COMPACT PLUS) test strip as directed.    Marland Kitchen losartan (COZAAR) 25 MG tablet Take 25 mg by mouth daily.    . Multiple Vitamin (MULTI-VITAMINS) TABS Take 1 tablet by mouth daily.    . nitroGLYCERIN (NITROSTAT) 0.4 MG SL tablet Place 0.4 mg under the tongue every 5 (five) minutes as needed for chest pain.     . rivastigmine (EXELON) 9.5 mg/24hr Place  9.5 mg onto the skin daily.    . rosuvastatin (CRESTOR) 5 MG tablet Take 5 mg by mouth daily.    . sertraline (ZOLOFT) 50 MG tablet Take 50 mg by mouth daily.    . tamsulosin (FLOMAX) 0.4 MG CAPS capsule Take 0.4 mg by mouth daily.     No current facility-administered medications for this visit.     Past Medical History:  Diagnosis Date  . Coronary artery disease 1995    remote left circumflex angioplasty without stent placement in 1995  . History of kidney stones   . History of shingles   . Hyperlipidemia   . Hypertension   . Melanoma ([place])   . Memory loss   . Pre-diabetes   . Presence of permanent cardiac pacemaker 03/24/2016    Past Surgical History:  Procedure Laterality Date  . EP IMPLANTABLE DEVICE N/A 03/24/2016   SJM Assurity MRI PPM implanted by Dr Rayann Heman for sick sinus syndrome  . herniated disk repair      Social History   Socioeconomic History  . Marital status: Married    Spouse name: Not on file  . Number of children: Not on file  . Years of education: Not on file  . Highest education level: Not on file  Social Needs  . Financial resource strain: Not on file  . Food insecurity - worry: Not on file  . Food insecurity - inability: Not on file  .  Transportation needs - medical: Not on file  . Transportation needs - non-medical: Not on file  Occupational History  . Not on file  Tobacco Use  . Smoking status: Never Smoker  . Smokeless tobacco: Never Used  Substance and Sexual Activity  . Alcohol use: No  . Drug use: No  . Sexual activity: Yes    Partners: Female    Comment: married  Other Topics Concern  . Not on file  Social History Narrative  . Not on file     Vitals:   05/03/17 0812  BP: 119/72  Pulse: (!) 59  Weight: 187 lb 12.8 oz (85.2 kg)  Height: 5' 8.5" (1.74 m)    Wt Readings from Last 3 Encounters:  05/03/17 187 lb 12.8 oz (85.2 kg)  10/28/16 183 lb (83 kg)  07/10/16 187 lb (84.8 kg)     PHYSICAL EXAM General:  NAD HEENT: Normal. Neck: No JVD, no thyromegaly. Lungs: Clear to auscultation bilaterally with normal respiratory effort. CV: Regular rate and rhythm, normal S1/S2, no S3/S4, no murmur. No pretibial or periankle edema.  No carotid bruit.   Abdomen: Soft, nontender, no distention.  Neurologic: Alert and oriented.  Psych: Normal affect. Skin: Normal. Musculoskeletal: No gross deformities.    ECG: Most recent ECG reviewed.   Labs: Lab Results  Component Value Date/Time   K 4.5 03/24/2016 10:10 AM   BUN 17 03/24/2016 10:10 AM   CREATININE 1.00 03/24/2016 10:10 AM   ALT 46 03/24/2016 10:00 AM   TSH 2.350 03/24/2016 09:38 AM   HGB 16.0 03/24/2016 10:10 AM     Lipids: No results found for: LDLCALC, LDLDIRECT, CHOL, TRIG, HDL     ASSESSMENT AND PLAN:  1. YUN:KLFQBDHURTIKKEN stable. Continue aspirin and statin.  2. Hyperlipidemia: Continue Zetia and Crestor.  3. Hypertension: Controlled on present therapy. No changes.  4. Symptomatic bradycardia and syncope s/p pacemaker: Normal device function by most recent interrogation. Follows with Dr. Rayann Heman.     Disposition: Follow up 6 months.   Kate Sable, M.D., F.A.C.C.

## 2017-07-09 ENCOUNTER — Encounter: Payer: Medicare Other | Admitting: Internal Medicine

## 2017-07-12 ENCOUNTER — Ambulatory Visit (INDEPENDENT_AMBULATORY_CARE_PROVIDER_SITE_OTHER): Payer: Medicare Other | Admitting: *Deleted

## 2017-07-12 DIAGNOSIS — I495 Sick sinus syndrome: Secondary | ICD-10-CM | POA: Diagnosis not present

## 2017-07-12 NOTE — Progress Notes (Signed)
Remote pacemaker transmission.   

## 2017-07-13 ENCOUNTER — Encounter: Payer: Self-pay | Admitting: Cardiology

## 2017-07-22 ENCOUNTER — Encounter: Payer: Self-pay | Admitting: Internal Medicine

## 2017-07-22 ENCOUNTER — Other Ambulatory Visit: Payer: Self-pay

## 2017-07-22 ENCOUNTER — Ambulatory Visit: Payer: Medicare Other | Admitting: Internal Medicine

## 2017-07-22 VITALS — BP 164/84 | HR 68 | Ht 68.5 in | Wt 191.0 lb

## 2017-07-22 DIAGNOSIS — I495 Sick sinus syndrome: Secondary | ICD-10-CM

## 2017-07-22 DIAGNOSIS — R0683 Snoring: Secondary | ICD-10-CM

## 2017-07-22 DIAGNOSIS — I10 Essential (primary) hypertension: Secondary | ICD-10-CM | POA: Diagnosis not present

## 2017-07-22 NOTE — Patient Instructions (Signed)
Your physician recommends that you schedule a follow-up appointment in: [place] recommends that you continue on your current medications as directed. Please refer to the Current Medication list given to you today.  Your physician has recommended that you have a sleep study. This test records several body functions during sleep, including: brain activity, eye movement, oxygen and carbon dioxide blood levels, heart rate and rhythm, breathing rate and rhythm, the flow of air through your mouth and nose, snoring, body muscle movements, and chest and belly movement.  Thank you for choosing [facility]!!

## 2017-07-22 NOTE — Progress Notes (Signed)
PCP: Manon Hilding, MD Primary Cardiologist:  Dr Bronson Ing Primary EP:  Dr Rayann Heman  Joel Nelson is a 84 y.o. male who presents today for routine electrophysiology followup.  Since last being seen in our clinic, the patient reports doing very well.  Today, he denies symptoms of palpitations, chest pain, shortness of breath,  lower extremity edema, dizziness, presyncope, or syncope.  The patient is otherwise without complaint today.   Past Medical History:  Diagnosis Date  . Coronary artery disease 1995    remote left circumflex angioplasty without stent placement in 1995  . History of kidney stones   . History of shingles   . Hyperlipidemia   . Hypertension   . Melanoma ([place])   . Memory loss   . Pre-diabetes   . Presence of permanent cardiac pacemaker 03/24/2016   Past Surgical History:  Procedure Laterality Date  . EP IMPLANTABLE DEVICE N/A 03/24/2016   SJM Assurity MRI PPM implanted by Dr Rayann Heman for sick sinus syndrome  . herniated disk repair      ROS- all systems are reviewed and negative except as per HPI above  Current Outpatient Medications  Medication Sig Dispense Refill  . ACCU-CHEK SOFTCLIX LANCETS lancets as directed.    Marland Kitchen aspirin EC 81 MG tablet Take 81 mg by mouth 4 (four) times daily.     . Blood Glucose Calibration (ACCU-CHEK COMPACT PLUS CONTROL) SOLN as directed.    . Blood Glucose Monitoring Suppl (ACCU-CHEK COMPACT CARE KIT) KIT as directed.    . Cyanocobalamin (RA VITAMIN B-12 TR) 1000 MCG TBCR Take 1 tablet by mouth daily.    Marland Kitchen ezetimibe (ZETIA) 10 MG tablet Take 10 mg by mouth daily.    . fluticasone (FLONASE) 50 MCG/ACT nasal spray Place 1 spray into both nostrils daily as needed for allergies.    Marland Kitchen glucose blood (ACCU-CHEK COMPACT PLUS) test strip as directed.    Marland Kitchen losartan (COZAAR) 25 MG tablet Take 25 mg by mouth daily.    . Multiple Vitamin (MULTI-VITAMINS) TABS Take 1 tablet by mouth daily.    . nitroGLYCERIN (NITROSTAT) 0.4 MG SL tablet  Place 0.4 mg under the tongue every 5 (five) minutes as needed for chest pain.     . rivastigmine (EXELON) 9.5 mg/24hr Place 9.5 mg onto the skin daily.    . rosuvastatin (CRESTOR) 5 MG tablet Take 5 mg by mouth daily.    . sertraline (ZOLOFT) 50 MG tablet Take 50 mg by mouth daily.    . tamsulosin (FLOMAX) 0.4 MG CAPS capsule Take 0.4 mg by mouth daily.     No current facility-administered medications for this visit.     Physical Exam: Vitals:   07/22/17 1556  BP: (!) 164/84  Pulse: 68  SpO2: 96%  Weight: 191 lb (86.6 kg)  Height: 5' 8.5" (1.74 m)    GEN- The patient is well appearing, alert and oriented x 3 today.   Head- normocephalic, atraumatic Eyes-  Sclera clear, conjunctiva pink Ears- hearing intact Oropharynx- clear Lungs- Clear to ausculation bilaterally, normal work of breathing Chest- pacemaker pocket is well healed Heart- Regular rate and rhythm, no murmurs, rubs or gallops, PMI not laterally displaced GI- soft, NT, ND, + BS Extremities- no clubbing, cyanosis, or edema  Pacemaker interrogation- reviewed in detail today,  See PACEART report    Assessment and Plan:  1. Symptomatic sinus bradycardia  Normal pacemaker function See Pace Art report No changes today  2. HTN Elevated today  Reports  better control at home No change required today  Merlin Return to see me in a year  Thompson Grayer MD, [facility] 07/22/2017 4:07 PM

## 2017-07-30 LAB — CUP PACEART INCLINIC DEVICE CHECK
Date Time Interrogation Session: 20190222140716
Implantable Lead Implant Date: 20171017
Implantable Lead Location: 753859
Implantable Pulse Generator Implant Date: 20171017
MDC IDC LEAD IMPLANT DT: 20171017
MDC IDC LEAD LOCATION: 753860
MDC IDC PG SERIAL: 7946710

## 2017-07-30 LAB — CUP PACEART REMOTE DEVICE CHECK
Battery Remaining Percentage: 95.5 %
Battery Voltage: 3.01 V
Brady Statistic AS VS Percent: 99 %
Brady Statistic RA Percent Paced: 1 %
Brady Statistic RV Percent Paced: 1 %
Date Time Interrogation Session: 20190204105755
Implantable Lead Implant Date: 20171017
Implantable Lead Implant Date: 20171017
Implantable Lead Location: 753860
Implantable Pulse Generator Implant Date: 20171017
Lead Channel Impedance Value: 510 Ohm
Lead Channel Pacing Threshold Amplitude: 0.5 V
Lead Channel Pacing Threshold Pulse Width: 0.5 ms
Lead Channel Pacing Threshold Pulse Width: 0.5 ms
Lead Channel Sensing Intrinsic Amplitude: 4.3 mV
Lead Channel Sensing Intrinsic Amplitude: 9.9 mV
Lead Channel Setting Pacing Amplitude: 1.5 V
Lead Channel Setting Sensing Sensitivity: 2 mV
MDC IDC LEAD LOCATION: 753859
MDC IDC MSMT BATTERY REMAINING LONGEVITY: 127 mo
MDC IDC MSMT LEADCHNL RA IMPEDANCE VALUE: 410 Ohm
MDC IDC MSMT LEADCHNL RV PACING THRESHOLD AMPLITUDE: 0.75 V
MDC IDC SET LEADCHNL RV PACING AMPLITUDE: 2.5 V
MDC IDC SET LEADCHNL RV PACING PULSEWIDTH: 0.5 ms
MDC IDC STAT BRADY AP VP PERCENT: 1 %
MDC IDC STAT BRADY AP VS PERCENT: 1 %
MDC IDC STAT BRADY AS VP PERCENT: 1 %
Pulse Gen Model: 2272
Pulse Gen Serial Number: 7946710

## 2017-08-04 ENCOUNTER — Other Ambulatory Visit: Payer: Self-pay

## 2017-08-04 DIAGNOSIS — R0683 Snoring: Secondary | ICD-10-CM

## 2017-08-05 ENCOUNTER — Telehealth: Payer: Self-pay | Admitting: Internal Medicine

## 2017-08-05 NOTE — Telephone Encounter (Signed)
Pre-cert Verification for the following procedure   Split Night Study scheduled for 08/11/17 at [facility] ordered by Dr. Rayann Heman.

## 2017-08-11 ENCOUNTER — Ambulatory Visit: Payer: Medicare Other | Attending: Internal Medicine | Admitting: Cardiology

## 2017-08-11 DIAGNOSIS — G4733 Obstructive sleep apnea (adult) (pediatric): Secondary | ICD-10-CM | POA: Diagnosis not present

## 2017-08-11 DIAGNOSIS — I493 Ventricular premature depolarization: Secondary | ICD-10-CM | POA: Diagnosis not present

## 2017-08-11 DIAGNOSIS — R0683 Snoring: Secondary | ICD-10-CM

## 2017-08-15 NOTE — Procedures (Signed)
NAME: Joel Nelson DATE OF BIRTH:  1934/05/16 MEDICAL RECORD NUMBER [id number]  LOCATION: [facility]  PHYSICIAN: Traci Turner  DATE OF STUDY: 08/11/2017  SLEEP STUDY TYPE: Positive Airway Pressure Titration               REFERRING PHYSICIAN: Thompson Grayer, MD   Gender: Male D.O.B: [birth year] Age (years): 58 Referring Provider: Thompson Grayer Height (inches): 34 Interpreting Physician: Fransico Him MD, ABSM Weight (lbs): 191 RPSGT: Peak, Robert BMI: 29 MRN: [id number] Neck Size: 17.00  CLINICAL INFORMATION Sleep Study Type: Split Night CPAP  Indication for sleep study: Snoring  Epworth Sleepiness Score: 8  SLEEP STUDY TECHNIQUE As per the AASM Manual for the Scoring of Sleep and Associated Events v2.3 (April 2016) with a hypopnea requiring 4% desaturations.  The channels recorded and monitored were frontal, central and occipital EEG, electrooculogram (EOG), submentalis EMG (chin), nasal and oral airflow, thoracic and abdominal wall motion, anterior tibialis EMG, snore microphone, electrocardiogram, and pulse oximetry. Continuous positive airway pressure (CPAP) was initiated when the patient met split night criteria and was titrated according to treat sleep-disordered breathing.  MEDICATIONS Medications self-administered by patient taken the night of the study : N/A  RESPIRATORY PARAMETERS Diagnostic Total AHI (/hr): 18.7  RDI (/hr):46.2  OA Index (/hr): 5.9  CA Index (/hr): 5.4 REM AHI (/hr): N/A  NREM AHI (/hr):18.7  Supine AHI (/hr):32.3  Non-supine AHI (/hr):1.13 Min O2 Sat (%):83.0  Mean O2 (%): 91.4  Time below 88% (min):8.0   Titration Optimal Pressure (cm):  AHI at Optimal Pressure (/hr):N/A  Min O2 at Optimal Pressure (%):88.0 Supine % at Optimal (%):N/A  Sleep % at Optimal (%):N/A   SLEEP ARCHITECTURE The recording time for the entire night was 370.1 minutes.  During a baseline period of 255.4 minutes, the patient slept for  122.0 minutes in REM and nonREM, yielding a sleep efficiency of 47.8%%. Sleep onset after lights out was 14.5 minutes with a REM latency of N/A minutes. The patient spent 19.7%% of the night in stage N1 sleep, 80.3%% in stage N2 sleep, 0.0%% in stage N3 and 0.0%% in REM.  During the titration period of 104.5 minutes, the patient slept for 47.5 minutes in REM and nonREM, yielding a sleep efficiency of 45.5%%. Sleep onset after CPAP initiation was 13.5 minutes with a REM latency of N/A minutes. The patient spent 27.4%% of the night in stage N1 sleep, 72.6%% in stage N2 sleep, 0.0%% in stage N3 and 0.0%% in REM.  CARDIAC DATA The 2 lead EKG demonstrated sinus rhythm. The mean heart rate was 100.0 beats per minute. Other EKG findings include: PVCs.  LEG MOVEMENT DATA The total Periodic Limb Movements of Sleep (PLMS) were 0. The PLMS index was 0.0 .  IMPRESSIONS - Moderate obstructive sleep apnea occurred during the diagnostic portion of the study(AHI = 18.7/hour). An optimal PAP pressure could not be selected for this patient based on the available study data. - Mild central sleep apnea occurred during the diagnostic portion of the study (CAI = 5.4/hour). - Mild oxygen desaturation was noted during the diagnostic portion of the study (Min O2 = 83.0%). - The patient snored with moderate snoring volume during the diagnostic portion of the study. - EKG findings include PVCs. - Clinically significant periodic limb movements did not occur during sleep.  DIAGNOSIS - Obstructive Sleep Apnea (327.23 [G47.33 ICD-10])  RECOMMENDATIONS - Recommend BiPAP titration given sub-optimal CPAP titration. - Avoid alcohol, sedatives and other CNS depressants that  may worsen sleep apnea and disrupt normal sleep architecture. - Sleep hygiene should be reviewed to assess factors that may improve sleep quality. - Weight management and regular exercise should be initiated or continued.  [place],  American Board of Sleep Medicine  ELECTRONICALLY SIGNED ON:  08/15/2017, 1:02 PM [place] PH: [phone]   FX: [phone] [place]

## 2017-08-18 ENCOUNTER — Telehealth: Payer: Self-pay | Admitting: Cardiology

## 2017-08-18 NOTE — Telephone Encounter (Signed)
New Message     1) What problem are you experiencing? Had sleep study done last week and was told they need prescription for cpap machine  Who is your medical equipment company? Patient needs prescription for the cpap machine sent to [facility] in [place] , does Dr Radford Pax write the prescription or Dr Rayann Heman since he ordered the test   2)    Please route to the sleep study assistant.

## 2017-08-19 ENCOUNTER — Telehealth: Payer: Self-pay | Admitting: *Deleted

## 2017-08-19 NOTE — Telephone Encounter (Signed)
BiPAP Titration sent to pre cert/sleep pool  Please set up BiPAP titration due to OSA and incufficient CPAP titration  Patient would like to test at [facility]

## 2017-08-19 NOTE — Telephone Encounter (Signed)
Informed patient of sleep study results and patient understanding was verbalized. Patient understands he has sleep apnea but could not be adequately titrated on CPAP and Dr Radford Pax recommends BiPAP titration.  Patient understands his BiPAP Titration study will be done at [place] lab. Patient understands he will receive a sleep packet in a week or so. Patient understands to call if he does not receive the sleep packet in a timely manner. Patient agrees with treatment and thanked me for call.

## 2017-08-19 NOTE — Telephone Encounter (Signed)
-----   Message from Sueanne Margarita, MD sent at 08/15/2017  1:05 PM EDT ----- Please let patient know that they have sleep apnea but could not be adequately titrated on CPAP and recommend BiPAP titration. Please set up titration in the sleep lab.

## 2017-08-19 NOTE — Telephone Encounter (Signed)
Reached out to the patient and gave him his sleep study results and Per Dr. Radford Pax he needs to go back to the lab for a BiPAP titration. Patient agrees with treatment.

## 2017-08-23 ENCOUNTER — Other Ambulatory Visit: Payer: Self-pay | Admitting: Cardiology

## 2017-08-23 DIAGNOSIS — G4733 Obstructive sleep apnea (adult) (pediatric): Secondary | ICD-10-CM

## 2017-08-24 ENCOUNTER — Ambulatory Visit: Payer: Medicare Other | Attending: Cardiology | Admitting: Cardiology

## 2017-08-24 DIAGNOSIS — R0683 Snoring: Secondary | ICD-10-CM | POA: Insufficient documentation

## 2017-08-24 DIAGNOSIS — G4733 Obstructive sleep apnea (adult) (pediatric): Secondary | ICD-10-CM | POA: Diagnosis present

## 2017-08-25 NOTE — Procedures (Signed)
   NAME: Joel Nelson DATE OF BIRTH:  [birth year] MEDICAL RECORD NUMBER [id number]  LOCATION: [facility]  PHYSICIAN: Jameela Michna  DATE OF STUDY: 08/24/2017  SLEEP STUDY TYPE: Positive Airway Pressure Titration               REFERRING PHYSICIAN: Sueanne Margarita, MD   Gender: Male D.O.B: February 29, 1934 Age (years): 9 Referring Provider: Thompson Grayer Height (inches): 68 Interpreting Physician: Fransico Him MD, ABSM Weight (lbs): 191 RPSGT: Rosebud Poles BMI: 29 MRN: [id number] Neck Size: 17.00  CLINICAL INFORMATION The patient is referred for a BiPAP titration to treat sleep apnea.  SLEEP STUDY TECHNIQUE As per the AASM Manual for the Scoring of Sleep and Associated Events v2.3 (April 2016) with a hypopnea requiring 4% desaturations.  The channels recorded and monitored were frontal, central and occipital EEG, electrooculogram (EOG), submentalis EMG (chin), nasal and oral airflow, thoracic and abdominal wall motion, anterior tibialis EMG, snore microphone, electrocardiogram, and pulse oximetry. Bilevel positive airway pressure (BPAP) was initiated at the beginning of the study and titrated to treat sleep-disordered breathing.  MEDICATIONS Medications self-administered by patient taken the night of the study : N/A  RESPIRATORY PARAMETERS Optimal IPAP Pressure (cm): 9  AHI at Optimal Pressure (/hr) 0.5 Optimal EPAP Pressure (cm):5  Overall Minimal O2 (%):87.0  Minimal O2 at Optimal Pressure (%): 89.0  SLEEP ARCHITECTURE Start Time:10:07:57 PM  Stop Time:5:23:31 AM  Total Time (min):435.6  Total Sleep Time (min):236.0 Sleep Latency (min):49.8 Sleep Efficiency (%): 54.2%  REM Latency (min):N/A  WASO (min):149.8 Stage N1 (%):12.9%  Stage N2 (%):62.5%  Stage N3 (%):24.6%  Stage R (%):0.00 Supine (%):12.97  Arousal Index (/hr):10.9   CARDIAC DATA The 2 lead EKG demonstrated sinus rhythm. The mean heart rate was N/A beats per minute. Other EKG  findings include: None.  LEG MOVEMENT DATA The total Periodic Limb Movements of Sleep (PLMS) were 0. The PLMS index was 0.0. A PLMS index of <15 is considered normal in adults.  IMPRESSIONS - An optimal PAP pressure of 9/5cm H2O was selected for this patient based on the available study data. - Central sleep apnea was not noted during this titration (CAI = 3.1/h). - Mild oxygen desaturations were observed during this titration (min O2 = 87.0%). - The patient snored with moderate snoring volume. - No cardiac abnormalities were observed during this study. - Clinically significant periodic limb movements were not noted during this study. Arousals associated with PLMs were rare.  DIAGNOSIS - Obstructive Sleep Apnea (327.23 [G47.33 ICD-10])  RECOMMENDATIONS - Recommend BiPAP therapy at 9/5cm H2O with heated humidity and mask of choice. - Avoid alcohol, sedatives and other CNS depressants that may worsen sleep apnea and disrupt normal sleep architecture. - Sleep hygiene should be reviewed to assess factors that may improve sleep quality. - Weight management and regular exercise should be initiated or continued. - Return to Sleep Center for re-evaluation after 10 weeks of therapy  [place], [place] [facility]  ELECTRONICALLY SIGNED ON:  08/25/2017, 7:06 PM [place] PH: [phone]   FX: [phone] [place]

## 2017-08-30 ENCOUNTER — Telehealth: Payer: Self-pay | Admitting: *Deleted

## 2017-08-30 NOTE — Telephone Encounter (Signed)
-----   Message from Sueanne Margarita, MD sent at 08/25/2017  7:09 PM EDT ----- Please let patient know that they had a successful PAP titration and let DME know that orders are in EPIC.  Please set up 10 week OV with me.

## 2017-08-30 NOTE — Telephone Encounter (Signed)
Informed patient of sleep study results and patient understanding was verbalized. Patient understands he had a successful BiPAP titration and Dr Radford Pax has ordered him a Cpap machine. Patient understands he will be contacted by [facility] to set up his cpap. He understands to call if [facility] does not contact him with new setup in a timely manner. He understands he will be called once confirmation has been received from [facility] that he has received his new machine to schedule 10 week follow up appointment.  [facility] notified of new cpap order  Please add to Maryfrances Bunnell He was grateful for the call and thanked me

## 2017-09-09 NOTE — Telephone Encounter (Signed)
Patient has a 10 week follow up appointment scheduled for  June 17 at 11:20 2019.  Patient understands she needs to keep this appointment for insurance compliance. Patient was grateful for the call and thanked me.

## 2017-10-11 ENCOUNTER — Ambulatory Visit (INDEPENDENT_AMBULATORY_CARE_PROVIDER_SITE_OTHER): Payer: Medicare Other | Admitting: *Deleted

## 2017-10-11 DIAGNOSIS — I495 Sick sinus syndrome: Secondary | ICD-10-CM

## 2017-10-11 NOTE — Progress Notes (Signed)
Remote pacemaker transmission.   

## 2017-10-12 ENCOUNTER — Telehealth: Payer: Self-pay | Admitting: Internal Medicine

## 2017-10-12 NOTE — Telephone Encounter (Signed)
New Message:       Pt is calling to see if we received his transmit on 10/11/17

## 2017-10-12 NOTE — Telephone Encounter (Signed)
Informed patient's wife that remote transmission was received. I also informed her that patient's transmissions come through automatically, so in the future they will only need to send manuals if they are called and told to. Wife verbalized understanding and appreciation of information.

## 2017-10-13 ENCOUNTER — Encounter: Payer: Self-pay | Admitting: Cardiology

## 2017-10-25 LAB — CUP PACEART REMOTE DEVICE CHECK
Battery Remaining Percentage: 95.5 %
Battery Voltage: 3.01 V
Brady Statistic AP VP Percent: 1 %
Brady Statistic AS VP Percent: 1 %
Brady Statistic AS VS Percent: 99 %
Brady Statistic RA Percent Paced: 1 %
Brady Statistic RV Percent Paced: 1 %
Date Time Interrogation Session: 20190506093746
Implantable Lead Implant Date: 20171017
Implantable Pulse Generator Implant Date: 20171017
Lead Channel Impedance Value: 490 Ohm
Lead Channel Pacing Threshold Amplitude: 0.375 V
Lead Channel Pacing Threshold Pulse Width: 0.5 ms
Lead Channel Pacing Threshold Pulse Width: 0.5 ms
Lead Channel Sensing Intrinsic Amplitude: 4.8 mV
Lead Channel Setting Pacing Amplitude: 2.5 V
Lead Channel Setting Sensing Sensitivity: 2 mV
MDC IDC LEAD IMPLANT DT: 20171017
MDC IDC LEAD LOCATION: 753859
MDC IDC LEAD LOCATION: 753860
MDC IDC MSMT BATTERY REMAINING LONGEVITY: 135 mo
MDC IDC MSMT LEADCHNL RA IMPEDANCE VALUE: 400 Ohm
MDC IDC MSMT LEADCHNL RV PACING THRESHOLD AMPLITUDE: 0.75 V
MDC IDC MSMT LEADCHNL RV SENSING INTR AMPL: 10.1 mV
MDC IDC PG SERIAL: 7946710
MDC IDC SET LEADCHNL RA PACING AMPLITUDE: 1.375
MDC IDC SET LEADCHNL RV PACING PULSEWIDTH: 0.5 ms
MDC IDC STAT BRADY AP VS PERCENT: 1 %
Pulse Gen Model: 2272

## 2017-11-08 ENCOUNTER — Encounter: Payer: Self-pay | Admitting: Cardiovascular Disease

## 2017-11-08 ENCOUNTER — Ambulatory Visit: Payer: Medicare Other | Admitting: Cardiovascular Disease

## 2017-11-08 VITALS — BP 104/64 | HR 61 | Ht 68.5 in | Wt 193.0 lb

## 2017-11-08 DIAGNOSIS — G4730 Sleep apnea, unspecified: Secondary | ICD-10-CM

## 2017-11-08 DIAGNOSIS — I25118 Atherosclerotic heart disease of native coronary artery with other forms of angina pectoris: Secondary | ICD-10-CM | POA: Diagnosis not present

## 2017-11-08 DIAGNOSIS — G3184 Mild cognitive impairment, so stated: Secondary | ICD-10-CM

## 2017-11-08 DIAGNOSIS — E785 Hyperlipidemia, unspecified: Secondary | ICD-10-CM | POA: Diagnosis not present

## 2017-11-08 DIAGNOSIS — Z959 Presence of cardiac and vascular implant and graft, unspecified: Secondary | ICD-10-CM

## 2017-11-08 DIAGNOSIS — I10 Essential (primary) hypertension: Secondary | ICD-10-CM | POA: Diagnosis not present

## 2017-11-08 NOTE — Progress Notes (Signed)
SUBJECTIVE: The patient presents for routine follow-up.  He was diagnosed with sleep apnea earlier this year and is now on BiPAP.  He has some cognitive impairment and follows with a neurologist at [facility].  The patient denies any symptoms of chest pain, palpitations, shortness of breath, lightheadedness, dizziness, leg swelling, orthopnea, PND, and syncope.       Review of Systems: As per "subjective", otherwise negative.  No Known Allergies  Current Outpatient Medications  Medication Sig Dispense Refill  . ACCU-CHEK SOFTCLIX LANCETS lancets as directed.    Marland Kitchen aspirin EC 81 MG tablet Take 81 mg by mouth 4 (four) times daily.     . Blood Glucose Calibration (ACCU-CHEK COMPACT PLUS CONTROL) SOLN as directed.    . Blood Glucose Monitoring Suppl (ACCU-CHEK COMPACT CARE KIT) KIT as directed.    . Cyanocobalamin (RA VITAMIN B-12 TR) 1000 MCG TBCR Take 1 tablet by mouth daily.    Marland Kitchen ezetimibe (ZETIA) 10 MG tablet Take 10 mg by mouth daily.    . fluticasone (FLONASE) 50 MCG/ACT nasal spray Place 1 spray into both nostrils daily as needed for allergies.    Marland Kitchen glucose blood (ACCU-CHEK COMPACT PLUS) test strip as directed.    Marland Kitchen losartan (COZAAR) 25 MG tablet Take 25 mg by mouth daily.    . Multiple Vitamin (MULTI-VITAMINS) TABS Take 1 tablet by mouth daily.    . nitroGLYCERIN (NITROSTAT) 0.4 MG SL tablet Place 0.4 mg under the tongue every 5 (five) minutes as needed for chest pain.     . rivastigmine (EXELON) 9.5 mg/24hr Place 9.5 mg onto the skin daily.    . rosuvastatin (CRESTOR) 5 MG tablet Take 5 mg by mouth daily.    . sertraline (ZOLOFT) 50 MG tablet Take 50 mg by mouth daily.    . tamsulosin (FLOMAX) 0.4 MG CAPS capsule Take 0.4 mg by mouth daily.     No current facility-administered medications for this visit.     Past Medical History:  Diagnosis Date  . Coronary artery disease 1995    remote left circumflex angioplasty without stent placement in 1995  . History of kidney  stones   . History of shingles   . Hyperlipidemia   . Hypertension   . Melanoma ([place])   . Memory loss   . Pre-diabetes   . Presence of permanent cardiac pacemaker 03/24/2016  . Sick sinus syndrome ([place]) 03/24/2016  . Syncope 03/24/2016    Past Surgical History:  Procedure Laterality Date  . EP IMPLANTABLE DEVICE N/A 03/24/2016   SJM Assurity MRI PPM implanted by Dr Rayann Heman for sick sinus syndrome  . herniated disk repair      Social History   Socioeconomic History  . Marital status: Married    Spouse name: Not on file  . Number of children: Not on file  . Years of education: Not on file  . Highest education level: Not on file  Occupational History  . Not on file  Social Needs  . Financial resource strain: Not on file  . Food insecurity:    Worry: Not on file    Inability: Not on file  . Transportation needs:    Medical: Not on file    Non-medical: Not on file  Tobacco Use  . Smoking status: Never Smoker  . Smokeless tobacco: Never Used  Substance and Sexual Activity  . Alcohol use: No  . Drug use: No  . Sexual activity: Yes    Partners: Female  Comment: married  Lifestyle  . Physical activity:    Days per week: Not on file    Minutes per session: Not on file  . Stress: Not on file  Relationships  . Social connections:    Talks on phone: Not on file    Gets together: Not on file    Attends religious service: Not on file    Active member of club or organization: Not on file    Attends meetings of clubs or organizations: Not on file    Relationship status: Not on file  . Intimate partner violence:    Fear of current or ex partner: Not on file    Emotionally abused: Not on file    Physically abused: Not on file    Forced sexual activity: Not on file  Other Topics Concern  . Not on file  Social History Narrative  . Not on file     Vitals:   11/08/17 1021  BP: 104/64  Pulse: 61  SpO2: 98%  Weight: 193 lb (87.5 kg)  Height: 5' 8.5" (1.74 m)     Wt Readings from Last 3 Encounters:  11/08/17 193 lb (87.5 kg)  07/22/17 191 lb (86.6 kg)  05/03/17 187 lb 12.8 oz (85.2 kg)     PHYSICAL EXAM General: NAD HEENT: Normal. Neck: No JVD, no thyromegaly. Lungs: Clear to auscultation bilaterally with normal respiratory effort. CV: Regular rate and rhythm, normal S1/S2, no S3/S4, no murmur. No pretibial or periankle edema.  No carotid bruit.   Abdomen: Soft, nontender, no distention.  Neurologic: Alert and oriented.  Psych: Normal affect. Skin: Normal. Musculoskeletal: No gross deformities.    ECG: Most recent ECG reviewed.   Labs: Lab Results  Component Value Date/Time   K 4.5 03/24/2016 10:10 AM   BUN 17 03/24/2016 10:10 AM   CREATININE 1.00 03/24/2016 10:10 AM   ALT 46 03/24/2016 10:00 AM   TSH 2.350 03/24/2016 09:38 AM   HGB 16.0 03/24/2016 10:10 AM     Lipids: No results found for: LDLCALC, LDLDIRECT, CHOL, TRIG, HDL     ASSESSMENT AND PLAN:  1. MMN:[id number] stable. Continue aspirin and statin.  2. Hyperlipidemia:Continue Zetia and Crestor.  3. Hypertension:Controlled on present therapy. No changes.  4. Symptomatic bradycardia and syncope s/p pacemaker:Normal device function by most recent interrogation. Follows with Dr. Rayann Heman.  5.  Sleep apnea: Currently on BiPAP and tolerating it fairly well.  6.  Mild cognitive impairment: Currently on 325 mg of aspirin as per Dr. Cristela Blue at [facility].   Disposition: Follow up 6 months   Kate Sable, M.D., F.A.C.C.

## 2017-11-08 NOTE — Patient Instructions (Addendum)

## 2017-11-22 ENCOUNTER — Encounter: Payer: Self-pay | Admitting: Cardiology

## 2017-11-22 ENCOUNTER — Ambulatory Visit: Payer: Medicare Other | Admitting: Cardiology

## 2017-11-22 DIAGNOSIS — I10 Essential (primary) hypertension: Secondary | ICD-10-CM | POA: Diagnosis not present

## 2017-11-22 DIAGNOSIS — G4733 Obstructive sleep apnea (adult) (pediatric): Secondary | ICD-10-CM | POA: Diagnosis not present

## 2017-11-22 DIAGNOSIS — G4719 Other hypersomnia: Secondary | ICD-10-CM

## 2017-11-22 HISTORY — DX: Other hypersomnia: G47.19

## 2017-11-22 NOTE — Addendum Note (Signed)
Addended by: Fransico Him R on: 11/22/2017 11:09 AM   Modules accepted: Level of Service

## 2017-11-22 NOTE — Patient Instructions (Signed)
Medication Instructions:  Your physician recommends that you continue on your current medications as directed. Please refer to the Current Medication list given to you today.  If you need a refill on your cardiac medications, please contact your pharmacy first.  Labwork: None ordered   Testing/Procedures: None ordered   Follow-Up: Your physician wants you to follow-up in: 1 year with Dr. Turner. You will receive a reminder letter in the mail two months in advance. If you don't receive a letter, please call our office to schedule the follow-up appointment.  Any Other Special Instructions Will Be Listed Below (If Applicable).   Thank you for choosing [facility]    Rena Shanara Schnieders, RN  [phone]  If you need a refill on your cardiac medications before your next appointment, please call your pharmacy.   

## 2017-11-22 NOTE — Progress Notes (Signed)
Cardiology Office Note:    Date:  11/22/2017   ID:  Joel Nelson, DOB Mar 09, 1934, MRN [id number]  PCP:  Manon Hilding, MD  Cardiologist:  No primary care provider on file.    Referring MD: Manon Hilding, MD   Chief Complaint  Patient presents with  . Sleep Apnea  . Hypertension    History of Present Illness:    Joel Nelson is a 84 y.o. male with a hx of CAD, hyperlipidemia and hypertension who was referred by Dr. Bronson Ing for sleep study due to bradycardia as well as snoring.  He was found to have moderate obstructive sleep apnea with an AHI of 18.7/h and mild central sleep apnea with his CAI of 5.4/h.  Oxygen desaturations were noted down to 83%.  There was moderate snoring.  He had unsuccessful PAP titration due to ongoing respiratory events and was eventually titrated on BiPAP 9/5 centimeters H2O.  He is now here for follow-up. He is doing well with his PAP device and thinks that he has gotten used to it.  He tolerates the mask and feels the pressure is adequate.  Since going on PAP he feels rested in the am and has no significant daytime sleepiness.  He denies any significant mouth or nasal dryness or nasal congestion.  He does not think that he snores.    Past Medical History:  Diagnosis Date  . Coronary artery disease 1995    remote left circumflex angioplasty without stent placement in 1995  . Excessive daytime sleepiness 11/22/2017  . History of kidney stones   . History of shingles   . Hyperlipidemia   . Hypertension   . Melanoma ([place])   . Memory loss   . OSA treated with BiPAP    moderate obstructive sleep apnea with an AHI of 18.7/h and mild central sleep apnea with his CAI of 5.4/h.On BiPAP at 9/5cm H2O  . Pre-diabetes   . Presence of permanent cardiac pacemaker 03/24/2016  . Sick sinus syndrome ([place]) 03/24/2016  . Syncope 03/24/2016    Past Surgical History:  Procedure Laterality Date  . EP IMPLANTABLE DEVICE N/A 03/24/2016   SJM Assurity MRI PPM  implanted by Dr Rayann Heman for sick sinus syndrome  . herniated disk repair      Current Medications: Current Meds  Medication Sig  . ACCU-CHEK SOFTCLIX LANCETS lancets as directed.  Marland Kitchen aspirin EC 81 MG tablet Take 81 mg by mouth daily.  . Blood Glucose Calibration (ACCU-CHEK COMPACT PLUS CONTROL) SOLN as directed.  . Blood Glucose Monitoring Suppl (ACCU-CHEK COMPACT CARE KIT) KIT as directed.  . Cyanocobalamin (RA VITAMIN B-12 TR) 1000 MCG TBCR Take 1 tablet by mouth daily.  Marland Kitchen ezetimibe (ZETIA) 10 MG tablet Take 10 mg by mouth daily.  . fluticasone (FLONASE) 50 MCG/ACT nasal spray Place 1 spray into both nostrils daily as needed for allergies.  Marland Kitchen glucose blood (ACCU-CHEK COMPACT PLUS) test strip as directed.  Marland Kitchen losartan (COZAAR) 25 MG tablet Take 25 mg by mouth daily.  . Multiple Vitamin (MULTI-VITAMINS) TABS Take 1 tablet by mouth daily.  . nitroGLYCERIN (NITROSTAT) 0.4 MG SL tablet Place 0.4 mg under the tongue every 5 (five) minutes as needed for chest pain.   . rivastigmine (EXELON) 9.5 mg/24hr Place 9.5 mg onto the skin daily.  . rosuvastatin (CRESTOR) 5 MG tablet Take 5 mg by mouth daily.  . sertraline (ZOLOFT) 25 MG tablet Take 25 mg by mouth at bedtime.  . tamsulosin (FLOMAX) 0.4 MG CAPS capsule  Take 0.4 mg by mouth daily.     Allergies:   Patient has no known allergies.   Social History   Socioeconomic History  . Marital status: Married    Spouse name: Not on file  . Number of children: Not on file  . Years of education: Not on file  . Highest education level: Not on file  Occupational History  . Not on file  Social Needs  . Financial resource strain: Not on file  . Food insecurity:    Worry: Not on file    Inability: Not on file  . Transportation needs:    Medical: Not on file    Non-medical: Not on file  Tobacco Use  . Smoking status: Never Smoker  . Smokeless tobacco: Never Used  Substance and Sexual Activity  . Alcohol use: No  . Drug use: No  . Sexual  activity: Yes    Partners: Female    Comment: married  Lifestyle  . Physical activity:    Days per week: Not on file    Minutes per session: Not on file  . Stress: Not on file  Relationships  . Social connections:    Talks on phone: Not on file    Gets together: Not on file    Attends religious service: Not on file    Active member of club or organization: Not on file    Attends meetings of clubs or organizations: Not on file    Relationship status: Not on file  Other Topics Concern  . Not on file  Social History Narrative  . Not on file     Family History: The patient's family history includes Alzheimer's disease in his mother; Lung cancer in his sister.  ROS:   Please see the history of present illness.    ROS  All other systems reviewed and negative.   EKGs/Labs/Other Studies Reviewed:    The following studies were reviewed today: PAP download  EKG:  EKG is not ordered today.    Recent Labs: No results found for requested labs within last 8760 hours.   Recent Lipid Panel No results found for: CHOL, TRIG, HDL, CHOLHDL, VLDL, LDLCALC, LDLDIRECT  Physical Exam:    VS:  BP 128/70   Pulse 69   Ht 5' 8.5" (1.74 m)   Wt 192 lb 3.2 oz (87.2 kg)   SpO2 95%   BMI 28.80 kg/m     Wt Readings from Last 3 Encounters:  11/22/17 192 lb 3.2 oz (87.2 kg)  11/08/17 193 lb (87.5 kg)  07/22/17 191 lb (86.6 kg)     GEN:  Well nourished, well developed in no acute distress HEENT: Normal NECK: No JVD; No carotid bruits LYMPHATICS: No lymphadenopathy CARDIAC: RRR, no murmurs, rubs, gallops RESPIRATORY:  Clear to auscultation without rales, wheezing or rhonchi  ABDOMEN: Soft, non-tender, non-distended MUSCULOSKELETAL:  No edema; No deformity  SKIN: Warm and dry NEUROLOGIC:  Alert and oriented x 3 PSYCHIATRIC:  Normal affect   ASSESSMENT:    1. OSA treated with BiPAP   2. Benign essential HTN   3. Excessive daytime sleepiness    PLAN:    In order of problems  listed above:  1.  OSA - the patient is tolerating PAP therapy well without any problems. The PAP download was reviewed today and showed an AHI of 3.1/hr on 9/5 cm H2O with 50% compliance in using more than 4 hours nightly.  The patient has been using and benefiting from PAP use  and will continue to benefit from therapy.  I encouraged him to try to be more compliant with his device.  He seems to have problems some nights getting to sleep and therefore will not use it.  I told him to try to get out of the routine of taking a nap during the day so that he is sleepier at night to get to sleep once he puts the Pap device on.  2.  HTN - Bp is well controlled on exam today.  He will continue on losartan 25 mg daily.  3.  Excessive daytime sleepiness - related to #1 -he is still sleepy sometimes during the day and has to nap.  Then he has problems getting asleep at night.  I have encouraged him to try to get away from napping during the day.  If he gets sleepy then try to get up and get into some activity.  Then hopefully he will be more tired at night and easier to get to sleep.     Medication Adjustments/Labs and Tests Ordered: Current medicines are reviewed at length with the patient today.  Concerns regarding medicines are outlined above.  No orders of the defined types were placed in this encounter.  No orders of the defined types were placed in this encounter.   Signed, Fransico Him, MD  11/22/2017 11:08 AM    [place]

## 2017-11-24 ENCOUNTER — Telehealth: Payer: Self-pay | Admitting: Cardiology

## 2017-11-24 ENCOUNTER — Telehealth: Payer: Self-pay | Admitting: *Deleted

## 2017-11-24 NOTE — Telephone Encounter (Signed)
Remote transmission reviewed. Presenting rhythm: AsVs. <1% Ap/<1% Vp. (13) AHR episodes, max dur. 14 sec, last 11/21/17. No VHR episodes. Normal device function.  Spoke with wife about patient's episode from yesterday morning. Wife states that patient had an episode yesterday morning where he was very weak and confused. Wife states that the episode may have occurred just prior to breakfast. Systolic BP was >/=[id number] just after the episode. Wife did not check CBG.  Wife states that she discussed the incident with patient's PCP at his appt yesterday. No abnormalities noted at that ov per wife. I informed wife that patient did not have any episodes from yesterday recorded on his remote. Wife verbalized understanding. Wife states that she called to get an appt with patient's general cardiologist, but they won't be able to be seen until Friday. Wife states that she will probably take him to the ER if he has another episode. I told wife that I agree with her plan.

## 2017-11-24 NOTE — Telephone Encounter (Signed)
Notified via detailed voice message.  

## 2017-11-24 NOTE — Telephone Encounter (Signed)
Unclear etiology. In the absence of chest pain or significant exertional dyspnea, unlikely to be related to ischemic heart disease. Will await Dr. Harrington Challenger' evaluation this week.

## 2017-11-24 NOTE — Telephone Encounter (Signed)
Wife [name]) calling with c/o near syncopal episode yesterday after getting out of shower.  Looked pale, felt clammy.  Does have some dementia, but seemed to be a little more confused after this episode.  As the day went on, he did start to feeling back to his normal.  Did see his pmd, suggested he see cardiologist.  No c/o chest pain.  Stated that she does notice that he has some SOB with exertion occasionally (off/on).  BP this morning 132/71  63.  Stated that he just does not feel good day, not his normal self.  Stated that he does have pacemaker & she has called to have remote device check as well.  Wife stated that she feels he needs to be evaluated in the office, does not want to take to ED if she can avoid it.  OV scheduled with Dr. Harrington Challenger for Friday, 11/26/2017 in the [place] office.  Will send message to Dr. Bronson Ing for his suggestions at her request.  Suggested ED if symptoms worsen in the meantime.

## 2017-11-24 NOTE — Telephone Encounter (Signed)
Patient wife and stated that she wants to know if patient had any episodes from Tuesday morning around 8:00 - 9:00. She stated that patient was weak, dizzy, and little more confused than normal. Pt went and seen PCP yesterday and PCP and there was no abnormal findings at this appt. Instructed pt wife to send a remote transmission w/ pt home monitor and a Device Tech RN will review and call back with the results. Pt wife verbalized understanding.

## 2017-11-26 ENCOUNTER — Ambulatory Visit: Payer: Medicare Other | Admitting: Internal Medicine

## 2017-11-26 ENCOUNTER — Encounter: Payer: Self-pay | Admitting: Internal Medicine

## 2017-11-26 VITALS — BP 128/68 | HR 67 | Ht 68.0 in | Wt 194.0 lb

## 2017-11-26 DIAGNOSIS — R001 Bradycardia, unspecified: Secondary | ICD-10-CM | POA: Diagnosis not present

## 2017-11-26 DIAGNOSIS — R55 Syncope and collapse: Secondary | ICD-10-CM

## 2017-11-26 DIAGNOSIS — I25118 Atherosclerotic heart disease of native coronary artery with other forms of angina pectoris: Secondary | ICD-10-CM

## 2017-11-26 NOTE — Progress Notes (Signed)
Cardiology Office Note   Date:  11/26/2017   ID:  Joel Nelson, DOB 06-Dec-1933, MRN [id number]  PCP:  Manon Hilding, MD  Cardiologist:   Bronson Ing  Pt presents for eval of dizziness   History of Present Illness: Joel Nelson is a 84 y.o. 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 ACCU-CHEK SOFTCLIX LANCETS lancets as directed.  Marland Kitchen aspirin EC 81 MG tablet Take 81 mg by mouth daily.  . Blood Glucose Calibration (ACCU-CHEK COMPACT PLUS CONTROL) SOLN as directed.  . Blood Glucose Monitoring Suppl (ACCU-CHEK COMPACT CARE KIT) KIT as directed.  . Cyanocobalamin (RA VITAMIN B-12 TR) 1000 MCG TBCR Take 1 tablet by mouth daily.  Marland Kitchen ezetimibe (ZETIA) 10 MG tablet Take 10 mg by mouth daily.  . fluticasone (FLONASE) 50 MCG/ACT nasal spray Place 1 spray into both nostrils daily as needed for allergies.  Marland Kitchen glucose blood (ACCU-CHEK COMPACT PLUS) test strip as directed.  Marland Kitchen losartan (COZAAR) 25 MG tablet Take 25 mg by mouth daily.  . Multiple Vitamin (MULTI-VITAMINS) TABS Take 1 tablet by mouth daily.  . nitroGLYCERIN (NITROSTAT) 0.4 MG SL tablet Place 0.4 mg under the tongue every 5 (five) minutes as needed for chest pain.   . rivastigmine (EXELON) 9.5 mg/24hr Place 9.5 mg onto the skin daily.  . rosuvastatin (CRESTOR) 5 MG tablet Take 5 mg by mouth daily.  . sertraline (ZOLOFT) 25 MG tablet Take 25 mg by mouth at bedtime.  . tamsulosin (FLOMAX) 0.4 MG CAPS capsule Take 0.4 mg by mouth daily.     Allergies:   Patient has no known  allergies.   Past Medical History:  Diagnosis Date  . Coronary artery disease 1995    remote left circumflex angioplasty without stent placement in 1995  . Excessive daytime sleepiness 11/22/2017  . History of kidney stones   . History of shingles   . Hyperlipidemia   . Hypertension   . Melanoma ([place])   . Memory loss   . OSA treated with BiPAP    moderate obstructive sleep apnea with an AHI of 18.7/h and mild central sleep apnea with his CAI of 5.4/h.On BiPAP at 9/5cm H2O  . Pre-diabetes   . Presence of permanent cardiac pacemaker 03/24/2016  . Sick sinus syndrome ([place]) 03/24/2016  . Syncope 03/24/2016    Past Surgical History:  Procedure Laterality Date  . EP IMPLANTABLE DEVICE N/A 03/24/2016   SJM Assurity MRI PPM implanted by Dr Rayann Heman for sick sinus syndrome  . herniated disk repair       Social History:  The patient  reports that he has never smoked. He has never used smokeless tobacco. He reports that he does not drink alcohol or use drugs.   Family History:  The patient's family history includes Alzheimer's disease in his mother; Lung cancer in his sister.    ROS:  Please see the history of present illness. All other systems are reviewed and  Negative to the above problem except as noted.    PHYSICAL EXAM: VS:  BP 128/68   Pulse 67   Ht '5\' 8"'  (1.727 m)   Wt 88 kg (194 lb)   SpO2 95%   BMI 29.50 kg/m   GEN: Well nourished, well developed, in no acute distress  HEENT: normal  Neck: no JVD, carotid bruits, or masses Cardiac: RRR; no murmurs, rubs, or gallops,no edema  Respiratory:  clear to auscultation bilaterally, normal work of breathing GI: soft, nontender, nondistended, + BS  No hepatomegaly  MS: no deformity Moving all extremities   Skin: warm and dry, no rash Neuro:  Strength and sensation are intact Psych: euthymic mood, full affect   EKG:  EKG is not ordered today.   Lipid Panel No results found for: CHOL, TRIG, HDL, CHOLHDL, VLDL, LDLCALC,  LDLDIRECT    Wt Readings from Last 3 Encounters:  11/26/17 88 kg (194 lb)  11/22/17 87.2 kg (192 lb 3.2 oz)  11/08/17 87.5 kg (193 lb)      ASSESSMENT AND PLAN:  1  Dizzy spell/   Spell sounds  situational, poss vasovagal.  Took meds then took hot shower.  Got out  Moving around  Has been feeling OK since I told the pt and his wife that he should try to take shower later in day   Not at same time as meds   Stay hydreated  2   Hx bradycardia  S/p PPM  3  CAD   No symptoms NO CP or SOB     F/U for 6 months      Current medicines are reviewed at length with the patient today.  The patient does not have concerns regarding medicines.  Signed, Dorris Carnes, MD  11/26/2017 3:17 PM    [address], [place], [place]  [address] Phone: [phone]; Fax: [phone]

## 2017-11-26 NOTE — Patient Instructions (Addendum)
Your physician wants you to follow-up in: as planned with Dr.Koneswaran as planned for November   Your physician recommends that you continue on your current medications as directed. Please refer to the Current Medication list given to you today.    If you need a refill on your cardiac medications before your next appointment, please call your pharmacy.    No tests or lab work ordered today.      Thank you for choosing [facility] !

## 2017-12-01 ENCOUNTER — Ambulatory Visit: Payer: Medicare Other | Admitting: Cardiovascular Disease

## 2018-01-07 ENCOUNTER — Telehealth: Payer: Self-pay | Admitting: *Deleted

## 2018-01-07 ENCOUNTER — Encounter: Payer: Self-pay | Admitting: *Deleted

## 2018-01-07 NOTE — Telephone Encounter (Signed)
This encounter was created in error - please disregard.

## 2018-01-07 NOTE — Telephone Encounter (Signed)
-----   Message from Sueanne Margarita, MD sent at 01/05/2018  6:24 PM EDT ----- Good AHI and compliance.  Continue current PAP settings.

## 2018-01-07 NOTE — Telephone Encounter (Signed)
Informed patient of sleep study results and patient understanding was verbalized. Patient is aware and agreeable to AHI being within range at 3.1. Patient is aware and agreeable to being in compliance with machine usage. Patient is aware and agreeable to no change in current pressures.

## 2018-01-07 NOTE — Telephone Encounter (Signed)
Informed patient of sleep study results and patient understanding was verbalized. Patient is aware and agreeable to AHI being within range at 3.1. Patient is aware and agreeable to improving his compliance with machine usage

## 2018-01-10 ENCOUNTER — Ambulatory Visit (INDEPENDENT_AMBULATORY_CARE_PROVIDER_SITE_OTHER): Payer: Medicare Other | Admitting: *Deleted

## 2018-01-10 DIAGNOSIS — R001 Bradycardia, unspecified: Secondary | ICD-10-CM

## 2018-01-10 DIAGNOSIS — I495 Sick sinus syndrome: Secondary | ICD-10-CM

## 2018-01-10 NOTE — Progress Notes (Signed)
Remote pacemaker transmission.   

## 2018-02-03 LAB — CUP PACEART REMOTE DEVICE CHECK
Battery Voltage: 3.01 V
Brady Statistic AP VP Percent: 1 %
Brady Statistic AS VS Percent: 99 %
Brady Statistic RA Percent Paced: 1 %
Brady Statistic RV Percent Paced: 1 %
Date Time Interrogation Session: 20190805060014
Implantable Lead Implant Date: 20171017
Implantable Lead Location: 753859
Implantable Pulse Generator Implant Date: 20171017
Lead Channel Impedance Value: 510 Ohm
Lead Channel Pacing Threshold Amplitude: 0.375 V
Lead Channel Pacing Threshold Pulse Width: 0.5 ms
Lead Channel Pacing Threshold Pulse Width: 0.5 ms
Lead Channel Sensing Intrinsic Amplitude: 10.6 mV
Lead Channel Setting Pacing Amplitude: 1.375
Lead Channel Setting Pacing Amplitude: 2.5 V
Lead Channel Setting Sensing Sensitivity: 2 mV
MDC IDC LEAD IMPLANT DT: 20171017
MDC IDC LEAD LOCATION: 753860
MDC IDC MSMT BATTERY REMAINING LONGEVITY: 136 mo
MDC IDC MSMT BATTERY REMAINING PERCENTAGE: 95.5 %
MDC IDC MSMT LEADCHNL RA IMPEDANCE VALUE: 410 Ohm
MDC IDC MSMT LEADCHNL RA SENSING INTR AMPL: 4.2 mV
MDC IDC MSMT LEADCHNL RV PACING THRESHOLD AMPLITUDE: 0.75 V
MDC IDC PG SERIAL: 7946710
MDC IDC SET LEADCHNL RV PACING PULSEWIDTH: 0.5 ms
MDC IDC STAT BRADY AP VS PERCENT: 1 %
MDC IDC STAT BRADY AS VP PERCENT: 1 %

## 2018-03-07 ENCOUNTER — Telehealth: Payer: Self-pay

## 2018-03-07 NOTE — Telephone Encounter (Signed)
Pt wife called stating that the pt had an episode Saturday morning between 10:45-11 am. The patient felt dizzy, clammy, and expiring.  Pt wife took pt blood pressure it was 120/70 and his pulse 60 bpm. The episode lasted for 2 minutes. Pt wife tried to get him to see his primary care on Saturday but was not able to get him seen. They was advised to go to the ER but pt did not want to go.

## 2018-03-07 NOTE — Telephone Encounter (Signed)
Remote transmission reviewed. Presenting rhythm: AsVs. <1% AT/AF burden, max dur. 20sec, last 02/02/18 - AT per EGMs. No ventricular high rates noted. <1% A/Vp. Stable lead measurements. Normal device function.  Informed patient's wife about the results of the remote transmission. Wife verbalized understanding and appreciation of information.

## 2018-04-11 ENCOUNTER — Ambulatory Visit (INDEPENDENT_AMBULATORY_CARE_PROVIDER_SITE_OTHER): Payer: Medicare Other | Admitting: *Deleted

## 2018-04-11 DIAGNOSIS — I495 Sick sinus syndrome: Secondary | ICD-10-CM

## 2018-04-11 NOTE — Progress Notes (Signed)
Remote pacemaker transmission.   

## 2018-04-14 ENCOUNTER — Encounter: Payer: Self-pay | Admitting: Cardiology

## 2018-05-09 ENCOUNTER — Encounter: Payer: Self-pay | Admitting: Cardiovascular Disease

## 2018-05-09 ENCOUNTER — Ambulatory Visit: Payer: Medicare Other | Admitting: Cardiovascular Disease

## 2018-05-09 VITALS — BP 142/73 | HR 64 | Ht 68.5 in | Wt 196.2 lb

## 2018-05-09 DIAGNOSIS — R001 Bradycardia, unspecified: Secondary | ICD-10-CM

## 2018-05-09 DIAGNOSIS — I25118 Atherosclerotic heart disease of native coronary artery with other forms of angina pectoris: Secondary | ICD-10-CM | POA: Diagnosis not present

## 2018-05-09 DIAGNOSIS — G4733 Obstructive sleep apnea (adult) (pediatric): Secondary | ICD-10-CM | POA: Diagnosis not present

## 2018-05-09 DIAGNOSIS — I495 Sick sinus syndrome: Secondary | ICD-10-CM | POA: Diagnosis not present

## 2018-05-09 DIAGNOSIS — Z959 Presence of cardiac and vascular implant and graft, unspecified: Secondary | ICD-10-CM

## 2018-05-09 DIAGNOSIS — I10 Essential (primary) hypertension: Secondary | ICD-10-CM

## 2018-05-09 DIAGNOSIS — G3184 Mild cognitive impairment, so stated: Secondary | ICD-10-CM

## 2018-05-09 NOTE — Progress Notes (Signed)
SUBJECTIVE: The patient presents for routine follow-up. He has a history of coronary artery disease with remote left circumflex coronary artery angioplasty without stent placement in 1995. He also has hyperlipidemia and hypertension. He also has sleep apnea.  He ultimately underwent dual-chamber pacemaker placement for symptomatic bradycardia and syncope in October 2017 by Dr. Rayann Heman.  The patient denies any symptoms of chest pain, palpitations, shortness of breath, lightheadedness, dizziness, leg swelling, orthopnea, PND, and syncope.  His primary issues relate to memory impairment for which he sees neurology in [place].    Review of Systems: As per "subjective", otherwise negative.  No Known Allergies  Current Outpatient Medications  Medication Sig Dispense Refill  . ACCU-CHEK SOFTCLIX LANCETS lancets as directed.    Marland Kitchen aspirin EC 81 MG tablet Take 81 mg by mouth daily.    . Blood Glucose Calibration (ACCU-CHEK COMPACT PLUS CONTROL) SOLN as directed.    . Blood Glucose Monitoring Suppl (ACCU-CHEK COMPACT CARE KIT) KIT as directed.    . Cyanocobalamin (RA VITAMIN B-12 TR) 1000 MCG TBCR Take 1 tablet by mouth daily.    Marland Kitchen ezetimibe (ZETIA) 10 MG tablet Take 10 mg by mouth daily.    . fluticasone (FLONASE) 50 MCG/ACT nasal spray Place 1 spray into both nostrils daily as needed for allergies.    Marland Kitchen glucose blood (ACCU-CHEK COMPACT PLUS) test strip as directed.    Marland Kitchen losartan (COZAAR) 25 MG tablet Take 25 mg by mouth daily.    . Multiple Vitamin (MULTI-VITAMINS) TABS Take 1 tablet by mouth daily.    . nitroGLYCERIN (NITROSTAT) 0.4 MG SL tablet Place 0.4 mg under the tongue every 5 (five) minutes as needed for chest pain.     . rivastigmine (EXELON) 9.5 mg/24hr Place 9.5 mg onto the skin daily.    . rosuvastatin (CRESTOR) 5 MG tablet Take 5 mg by mouth daily.    . sertraline (ZOLOFT) 25 MG tablet Take 25 mg by mouth at bedtime.    . tamsulosin (FLOMAX) 0.4 MG CAPS capsule Take  0.4 mg by mouth daily.     No current facility-administered medications for this visit.     Past Medical History:  Diagnosis Date  . Coronary artery disease 1995    remote left circumflex angioplasty without stent placement in 1995  . Excessive daytime sleepiness 11/22/2017  . History of kidney stones   . History of shingles   . Hyperlipidemia   . Hypertension   . Melanoma ([place])   . Memory loss   . OSA treated with BiPAP    moderate obstructive sleep apnea with an AHI of 18.7/h and mild central sleep apnea with his CAI of 5.4/h.On BiPAP at 9/5cm H2O  . Pre-diabetes   . Presence of permanent cardiac pacemaker 03/24/2016  . Sick sinus syndrome ([place]) 03/24/2016  . Syncope 03/24/2016    Past Surgical History:  Procedure Laterality Date  . EP IMPLANTABLE DEVICE N/A 03/24/2016   SJM Assurity MRI PPM implanted by Dr Rayann Heman for sick sinus syndrome  . herniated disk repair      Social History   Socioeconomic History  . Marital status: Married    Spouse name: Not on file  . Number of children: Not on file  . Years of education: Not on file  . Highest education level: Not on file  Occupational History  . Not on file  Social Needs  . Financial resource strain: Not on file  . Food insecurity:    Worry: Not  on file    Inability: Not on file  . Transportation needs:    Medical: Not on file    Non-medical: Not on file  Tobacco Use  . Smoking status: Never Smoker  . Smokeless tobacco: Never Used  Substance and Sexual Activity  . Alcohol use: No  . Drug use: No  . Sexual activity: Yes    Partners: Female    Comment: married  Lifestyle  . Physical activity:    Days per week: Not on file    Minutes per session: Not on file  . Stress: Not on file  Relationships  . Social connections:    Talks on phone: Not on file    Gets together: Not on file    Attends religious service: Not on file    Active member of club or organization: Not on file    Attends meetings of clubs or  organizations: Not on file    Relationship status: Not on file  . Intimate partner violence:    Fear of current or ex partner: Not on file    Emotionally abused: Not on file    Physically abused: Not on file    Forced sexual activity: Not on file  Other Topics Concern  . Not on file  Social History Narrative  . Not on file     Vitals:   05/09/18 1356  BP: (!) 142/73  Pulse: 64  Weight: 196 lb 3.2 oz (89 kg)  Height: 5' 8.5" (1.74 m)    Wt Readings from Last 3 Encounters:  05/09/18 196 lb 3.2 oz (89 kg)  11/26/17 194 lb (88 kg)  11/22/17 192 lb 3.2 oz (87.2 kg)     PHYSICAL EXAM General: NAD HEENT: Normal. Neck: No JVD, no thyromegaly. Lungs: Clear to auscultation bilaterally with normal respiratory effort. CV: Regular rate and rhythm, normal S1/S2, no S3/S4, no murmur. No pretibial or periankle edema.  No carotid bruit.   Abdomen: Soft, nontender, no distention.  Neurologic: Alert and oriented.  Psych: Normal affect. Skin: Normal. Musculoskeletal: No gross deformities.    ECG: Reviewed above under Subjective   Labs: Lab Results  Component Value Date/Time   K 4.5 03/24/2016 10:10 AM   BUN 17 03/24/2016 10:10 AM   CREATININE 1.00 03/24/2016 10:10 AM   ALT 46 03/24/2016 10:00 AM   TSH 2.350 03/24/2016 09:38 AM   HGB 16.0 03/24/2016 10:10 AM     Lipids: No results found for: LDLCALC, LDLDIRECT, CHOL, TRIG, HDL     ASSESSMENT AND PLAN:  1. IRC:[id number] stable. Continue aspirin and statin.  2. Hyperlipidemia:Continue Zetia and Crestor.  I will obtain a copy of lipids from PCP.  3. Hypertension:Blood pressure is mildly elevated today. No changes.  4. Symptomatic bradycardia and syncope s/p pacemaker:Normal device function bymostrecent interrogation. Follows with Dr. Rayann Heman.  5.  Sleep apnea: Currently on BiPAP.  6.  Mild cognitive impairment: Currently on aspirin. Followed by Dr. Cristela Blue at [facility].   Disposition: Follow up with Dr.  Rayann Heman as previously scheduled.  Follow-up with me in July 2020.   Kate Sable, M.D., F.A.C.C.

## 2018-05-09 NOTE — Patient Instructions (Signed)
Medication Instructions:  Continue all current medications.  Labwork: none  Testing/Procedures: none  Follow-Up: July 2020  Any Other Special Instructions Will Be Listed Below (If Applicable).  If you need a refill on your cardiac medications before your next appointment, please call your pharmacy.

## 2018-06-10 LAB — CUP PACEART REMOTE DEVICE CHECK
Battery Remaining Longevity: 135 mo
Battery Remaining Percentage: 95.5 %
Brady Statistic AP VS Percent: 1 %
Brady Statistic AS VP Percent: 1 %
Brady Statistic AS VS Percent: 99 %
Brady Statistic RA Percent Paced: 1 %
Brady Statistic RV Percent Paced: 1 %
Date Time Interrogation Session: 20191104060022
Implantable Lead Implant Date: 20171017
Implantable Lead Location: 753859
Implantable Lead Location: 753860
Implantable Pulse Generator Implant Date: 20171017
Lead Channel Pacing Threshold Amplitude: 0.75 V
Lead Channel Pacing Threshold Pulse Width: 0.5 ms
Lead Channel Sensing Intrinsic Amplitude: 9.6 mV
Lead Channel Setting Pacing Amplitude: 1.375
Lead Channel Setting Pacing Pulse Width: 0.5 ms
Lead Channel Setting Sensing Sensitivity: 2 mV
MDC IDC LEAD IMPLANT DT: 20171017
MDC IDC MSMT BATTERY VOLTAGE: 3.01 V
MDC IDC MSMT LEADCHNL RA IMPEDANCE VALUE: 400 Ohm
MDC IDC MSMT LEADCHNL RA PACING THRESHOLD AMPLITUDE: 0.375 V
MDC IDC MSMT LEADCHNL RA SENSING INTR AMPL: 4.6 mV
MDC IDC MSMT LEADCHNL RV IMPEDANCE VALUE: 490 Ohm
MDC IDC MSMT LEADCHNL RV PACING THRESHOLD PULSEWIDTH: 0.5 ms
MDC IDC SET LEADCHNL RV PACING AMPLITUDE: 2.5 V
MDC IDC STAT BRADY AP VP PERCENT: 1 %
Pulse Gen Model: 2272
Pulse Gen Serial Number: 7946710

## 2018-07-08 ENCOUNTER — Encounter: Payer: Self-pay | Admitting: Internal Medicine

## 2018-07-08 ENCOUNTER — Ambulatory Visit: Payer: Medicare Other | Admitting: Internal Medicine

## 2018-07-08 VITALS — BP 122/66 | HR 64 | Ht 68.0 in | Wt 194.0 lb

## 2018-07-08 DIAGNOSIS — Z950 Presence of cardiac pacemaker: Secondary | ICD-10-CM

## 2018-07-08 DIAGNOSIS — I495 Sick sinus syndrome: Secondary | ICD-10-CM | POA: Diagnosis not present

## 2018-07-08 DIAGNOSIS — I10 Essential (primary) hypertension: Secondary | ICD-10-CM

## 2018-07-08 NOTE — Progress Notes (Signed)
PCP: Manon Hilding, MD Primary Cardiologist: Dr Bronson Ing Primary EP:  Dr Rayann Heman  Joel Nelson is a 85 y.o. male who presents today for routine electrophysiology followup.  Since last being seen in our clinic, the patient reports doing very well.  Today, he denies symptoms of palpitations, chest pain, shortness of breath,  lower extremity edema, dizziness, presyncope, or syncope.  The patient is otherwise without complaint today.   Past Medical History:  Diagnosis Date  . Coronary artery disease 1995    remote left circumflex angioplasty without stent placement in 1995  . Excessive daytime sleepiness 11/22/2017  . History of kidney stones   . History of shingles   . Hyperlipidemia   . Hypertension   . Melanoma ([place])   . Memory loss   . OSA treated with BiPAP    moderate obstructive sleep apnea with an AHI of 18.7/h and mild central sleep apnea with his CAI of 5.4/h.On BiPAP at 9/5cm H2O  . Pre-diabetes   . Presence of permanent cardiac pacemaker 03/24/2016  . Sick sinus syndrome ([place]) 03/24/2016  . Syncope 03/24/2016   Past Surgical History:  Procedure Laterality Date  . EP IMPLANTABLE DEVICE N/A 03/24/2016   SJM Assurity MRI PPM implanted by Dr Rayann Heman for sick sinus syndrome  . herniated disk repair      ROS- all systems are reviewed and negative except as per HPI above  Current Outpatient Medications  Medication Sig Dispense Refill  . ACCU-CHEK SOFTCLIX LANCETS lancets as directed.    Marland Kitchen aspirin EC 81 MG tablet Take 324 mg by mouth daily. Takes 4 tabs daily per neurologist - started 03/25/2017.    Marland Kitchen Blood Glucose Calibration (ACCU-CHEK COMPACT PLUS CONTROL) SOLN as directed.    . Blood Glucose Monitoring Suppl (ACCU-CHEK COMPACT CARE KIT) KIT as directed.    . Cyanocobalamin (RA VITAMIN B-12 TR) 1000 MCG TBCR Take 1 tablet by mouth daily.    Marland Kitchen ezetimibe (ZETIA) 10 MG tablet Take 10 mg by mouth daily.    . fluticasone (FLONASE) 50 MCG/ACT nasal spray Place 1 spray  into both nostrils daily as needed for allergies.    Marland Kitchen glucose blood (ACCU-CHEK COMPACT PLUS) test strip as directed.    Marland Kitchen losartan (COZAAR) 25 MG tablet Take 25 mg by mouth daily.    . Multiple Vitamin (MULTI-VITAMINS) TABS Take 1 tablet by mouth daily.    . nitroGLYCERIN (NITROSTAT) 0.4 MG SL tablet Place 0.4 mg under the tongue every 5 (five) minutes as needed for chest pain.     . rivastigmine (EXELON) 9.5 mg/24hr Place 9.5 mg onto the skin daily.    . rosuvastatin (CRESTOR) 5 MG tablet Take 5 mg by mouth daily.    . sertraline (ZOLOFT) 25 MG tablet Take 12.5 mg by mouth at bedtime.     . tamsulosin (FLOMAX) 0.4 MG CAPS capsule Take 0.4 mg by mouth daily.     No current facility-administered medications for this visit.     Physical Exam: Vitals:   07/08/18 0842  BP: 122/66  Pulse: 64  SpO2: 95%  Weight: 194 lb (88 kg)  Height: '5\' 8"'  (1.727 m)    GEN- The patient is well appearing, alert and oriented x 3 today.   Head- normocephalic, atraumatic Eyes-  Sclera clear, conjunctiva pink Ears- hearing intact Oropharynx- clear Lungs- Clear to ausculation bilaterally, normal work of breathing Chest- pacemaker pocket is well healed Heart- Regular rate and rhythm, no murmurs, rubs or gallops, PMI not  laterally displaced GI- soft, NT, ND, + BS Extremities- no clubbing, cyanosis, or edema  Pacemaker interrogation- reviewed in detail today,  See PACEART report   Assessment and Plan:  1. Symptomatic sinus bradycardia  Normal pacemaker function See Pace Art report No changes today  2. HTN Stable No change required today  3. OSA Uses BiPAP Followed by Dr Dalia Heading Return in a year  Thompson Grayer MD, [facility] 07/08/2018 9:27 AM

## 2018-07-08 NOTE — Patient Instructions (Addendum)
Medication Instructions:  Continue all current medications.  Labwork: none  Testing/Procedures: none  Follow-Up: 1 year   Any Other Special Instructions Will Be Listed Below (If Applicable). Next remote 07/11/2018.  If you need a refill on your cardiac medications before your next appointment, please call your pharmacy.

## 2018-07-11 ENCOUNTER — Ambulatory Visit (INDEPENDENT_AMBULATORY_CARE_PROVIDER_SITE_OTHER): Payer: Medicare Other

## 2018-07-11 DIAGNOSIS — I495 Sick sinus syndrome: Secondary | ICD-10-CM | POA: Diagnosis not present

## 2018-07-13 LAB — CUP PACEART INCLINIC DEVICE CHECK
Battery Voltage: 3.01 V
Brady Statistic RA Percent Paced: 0.14 %
Brady Statistic RV Percent Paced: 0.05 %
Implantable Lead Implant Date: 20171017
Implantable Lead Implant Date: 20171017
Implantable Lead Location: 753859
Implantable Lead Location: 753860
Lead Channel Impedance Value: 400 Ohm
Lead Channel Impedance Value: 487.5 Ohm
Lead Channel Pacing Threshold Amplitude: 0.375 V
Lead Channel Pacing Threshold Pulse Width: 0.5 ms
Lead Channel Sensing Intrinsic Amplitude: 3.6 mV
Lead Channel Sensing Intrinsic Amplitude: 9.3 mV
Lead Channel Setting Pacing Amplitude: 1.375
MDC IDC MSMT BATTERY REMAINING LONGEVITY: 150 mo
MDC IDC MSMT LEADCHNL RA PACING THRESHOLD PULSEWIDTH: 0.5 ms
MDC IDC MSMT LEADCHNL RV PACING THRESHOLD AMPLITUDE: 0.75 V
MDC IDC PG IMPLANT DT: 20171017
MDC IDC PG SERIAL: 7946710
MDC IDC SESS DTM: 20200131144322
MDC IDC SET LEADCHNL RV PACING AMPLITUDE: 2.5 V
MDC IDC SET LEADCHNL RV PACING PULSEWIDTH: 0.5 ms
MDC IDC SET LEADCHNL RV SENSING SENSITIVITY: 2 mV

## 2018-07-13 LAB — CUP PACEART REMOTE DEVICE CHECK
Battery Remaining Longevity: 134 mo
Battery Remaining Percentage: 95.5 %
Battery Voltage: 3.01 V
Brady Statistic AP VP Percent: 1 %
Brady Statistic AS VS Percent: 99 %
Brady Statistic RA Percent Paced: 1 %
Implantable Lead Implant Date: 20171017
Implantable Lead Implant Date: 20171017
Implantable Lead Location: 753860
Implantable Pulse Generator Implant Date: 20171017
Lead Channel Impedance Value: 490 Ohm
Lead Channel Pacing Threshold Amplitude: 0.375 V
Lead Channel Pacing Threshold Pulse Width: 0.5 ms
Lead Channel Pacing Threshold Pulse Width: 0.5 ms
Lead Channel Sensing Intrinsic Amplitude: 4.2 mV
Lead Channel Setting Pacing Amplitude: 2.5 V
MDC IDC LEAD LOCATION: 753859
MDC IDC MSMT LEADCHNL RA IMPEDANCE VALUE: 400 Ohm
MDC IDC MSMT LEADCHNL RV PACING THRESHOLD AMPLITUDE: 0.75 V
MDC IDC MSMT LEADCHNL RV SENSING INTR AMPL: 9.9 mV
MDC IDC PG SERIAL: 7946710
MDC IDC SESS DTM: 20200203114812
MDC IDC SET LEADCHNL RA PACING AMPLITUDE: 1.375
MDC IDC SET LEADCHNL RV PACING PULSEWIDTH: 0.5 ms
MDC IDC SET LEADCHNL RV SENSING SENSITIVITY: 2 mV
MDC IDC STAT BRADY AP VS PERCENT: 1 %
MDC IDC STAT BRADY AS VP PERCENT: 0 %
MDC IDC STAT BRADY RV PERCENT PACED: 1 %
Pulse Gen Model: 2272

## 2018-07-15 ENCOUNTER — Encounter: Payer: Medicare Other | Admitting: Internal Medicine

## 2018-07-19 NOTE — Progress Notes (Signed)
Remote pacemaker transmission.   

## 2018-10-17 ENCOUNTER — Ambulatory Visit (INDEPENDENT_AMBULATORY_CARE_PROVIDER_SITE_OTHER): Payer: Medicare Other | Admitting: *Deleted

## 2018-10-17 ENCOUNTER — Other Ambulatory Visit: Payer: Self-pay

## 2018-10-17 DIAGNOSIS — I495 Sick sinus syndrome: Secondary | ICD-10-CM

## 2018-10-17 DIAGNOSIS — R001 Bradycardia, unspecified: Secondary | ICD-10-CM

## 2018-10-18 LAB — CUP PACEART REMOTE DEVICE CHECK
Date Time Interrogation Session: 20200512103805
Implantable Lead Implant Date: 20171017
Implantable Lead Implant Date: 20171017
Implantable Lead Location: 753859
Implantable Lead Location: 753860
Implantable Pulse Generator Implant Date: 20171017
Pulse Gen Model: 2272
Pulse Gen Serial Number: 7946710

## 2018-10-24 NOTE — Progress Notes (Signed)
Remote pacemaker transmission.   

## 2018-11-17 ENCOUNTER — Telehealth: Payer: Self-pay

## 2018-11-17 NOTE — Progress Notes (Signed)
Virtual Visit via Telephone Note   This visit type was conducted due to national recommendations for restrictions regarding the COVID-19 Pandemic (e.g. social distancing) in an effort to limit this patient's exposure and mitigate transmission in our community.  Due to his co-morbid illnesses, this patient is at least at moderate risk for complications without adequate follow up.  This format is felt to be most appropriate for this patient at this time.  The patient did not have access to video technology/had technical difficulties with video requiring transitioning to audio format only (telephone).  All issues noted in this document were discussed and addressed.  No physical exam could be performed with this format.  Please refer to the patient's chart for his  consent to telehealth for [facility].   Evaluation Performed:  Follow-up visit  This visit type was conducted due to national recommendations for restrictions regarding the COVID-19 Pandemic (e.g. social distancing).  This format is felt to be most appropriate for this patient at this time.  All issues noted in this document were discussed and addressed.  No physical exam was performed (except for noted visual exam findings with Video Visits).  Please refer to the patient's chart (MyChart message for video visits and phone note for telephone visits) for the patient's consent to telehealth for [facility].  Date:  11/18/2018   ID:  Joel Nelson, DOB 08/14/1933, MRN [id number]  Patient Location:  Home  Provider location:   [place]  PCP:  Manon Hilding, MD  Cardiologist:  Kate Sable, MD Sleep Medicine:  Fransico Him, MD Electrophysiologist:  [place] Grayer, MD   Chief Complaint:  OSA, HTN  History of Present Illness:    Joel Nelson is a 85 y.o. male who presents via audio/video conferencing for a telehealth visit today.    Joel Nelson is an 85 y.o. male with a hx of CAD, hyperlipidemia and hypertension who  was referred by Dr. Bronson Ing for sleep study due to bradycardia as well as snoring.  He was found to have moderate obstructive sleep apnea with an AHI of 18.7/h and mild central sleep apnea with his CAI of 5.4/h.  Oxygen desaturations were noted down to 83%.  There was moderate snoring.  He had unsuccessful PAP titration due to ongoing respiratory events and was eventually titrated on BiPAP 9/5 centimeters H2O.   He has been having a lot of problems with tolerating the mask which is a nasal pillow mask.  He hardly uses it.   He feels the pressure is adequate.  His wife says that he has a lot of dementia which she thinks has played a role in not wanting to use it.  He denies any significant mouth or nasal dryness or nasal congestion.    The patient does not have symptoms concerning for COVID-19 infection (fever, chills, cough, or new shortness of breath).    Prior CV studies:   The following studies were reviewed today:  PAP compliance download  Past Medical History:  Diagnosis Date  . Coronary artery disease 1995    remote left circumflex angioplasty without stent placement in 1995  . Excessive daytime sleepiness 11/22/2017  . History of kidney stones   . History of shingles   . Hyperlipidemia   . Hypertension   . Melanoma ([place])   . Memory loss   . OSA treated with BiPAP    moderate obstructive sleep apnea with an AHI of 18.7/h and mild central sleep apnea with his CAI of 5.4/h.On BiPAP  at 9/5cm H2O  . Pre-diabetes   . Presence of permanent cardiac pacemaker 03/24/2016  . Sick sinus syndrome ([place]) 03/24/2016  . Syncope 03/24/2016   Past Surgical History:  Procedure Laterality Date  . EP IMPLANTABLE DEVICE N/A 03/24/2016   SJM Assurity MRI PPM implanted by Dr Rayann Heman for sick sinus syndrome  . herniated disk repair       Current Meds  Medication Sig  . ACCU-CHEK SOFTCLIX LANCETS lancets as directed.  Marland Kitchen aspirin EC 81 MG tablet Take 324 mg by mouth daily. Takes 4 tabs daily per  neurologist - started 03/25/2017.  Marland Kitchen Blood Glucose Calibration (ACCU-CHEK COMPACT PLUS CONTROL) SOLN as directed.  . Blood Glucose Monitoring Suppl (ACCU-CHEK COMPACT CARE KIT) KIT as directed.  . Cyanocobalamin (RA VITAMIN B-12 TR) 1000 MCG TBCR Take 1 tablet by mouth daily.  Marland Kitchen ezetimibe (ZETIA) 10 MG tablet Take 10 mg by mouth daily.  . fluticasone (FLONASE) 50 MCG/ACT nasal spray Place 1 spray into both nostrils daily as needed for allergies.  Marland Kitchen glucose blood (ACCU-CHEK COMPACT PLUS) test strip as directed.  Marland Kitchen losartan (COZAAR) 25 MG tablet Take 25 mg by mouth daily.  . memantine (NAMENDA) 10 MG tablet Take 10 mg by mouth 2 (two) times daily.  . Multiple Vitamin (MULTI-VITAMINS) TABS Take 1 tablet by mouth daily.  . nitroGLYCERIN (NITROSTAT) 0.4 MG SL tablet Place 0.4 mg under the tongue every 5 (five) minutes as needed for chest pain.   . rivastigmine (EXELON) 9.5 mg/24hr Place 9.5 mg onto the skin daily.  . rosuvastatin (CRESTOR) 5 MG tablet Take 5 mg by mouth daily.  . sertraline (ZOLOFT) 25 MG tablet Take 25 mg by mouth daily.  . tamsulosin (FLOMAX) 0.4 MG CAPS capsule Take 0.4 mg by mouth daily.     Allergies:   Patient has no known allergies.   Social History   Tobacco Use  . Smoking status: Never Smoker  . Smokeless tobacco: Never Used  Substance Use Topics  . Alcohol use: No  . Drug use: No     Family Hx: The patient's family history includes Alzheimer's disease in his mother; Lung cancer in his sister.  ROS:   Please see the history of present illness.     All other systems reviewed and are negative.   Labs/Other Tests and Data Reviewed:    Recent Labs: No results found for requested labs within last 8760 hours.   Recent Lipid Panel No results found for: CHOL, TRIG, HDL, CHOLHDL, LDLCALC, LDLDIRECT  Wt Readings from Last 3 Encounters:  11/18/18 190 lb (86.2 kg)  07/08/18 194 lb (88 kg)  05/09/18 196 lb 3.2 oz (89 kg)     Objective:    Vital Signs:  BP  127/72   Pulse 67   Ht '5\' 8"'  (1.727 m)   Wt 190 lb (86.2 kg)   BMI 28.89 kg/m     ASSESSMENT & PLAN:    1.  OSA - the patient is tolerating PAP therapy well without any problems. The PAP download was reviewed today and showed an AHI of 0.8/hr on 9/5 cm H2O with 13% compliance in using more than 4 hours nightly.  The patient has been using and benefiting from PAP use and will continue to benefit from therapy. I have encouraged him to be more compliant with his device.   2.  Hypertension - his BP is controlled.  He will continue on Losartan 78m daily. His creatinine 0.90 in 07/2018.  COVID-19 Education: The signs and symptoms of COVID-19 were discussed with the patient and how to seek care for testing (follow up with PCP or arrange E-visit).  The importance of social distancing was discussed today.  Patient Risk:   After full review of this patient's clinical status, I feel that they are at least moderate risk at this time.  Time:   Today, I have spent 11 minutes directly with the patient on telephone discussing medical problems including OSA.  We also reviewed the symptoms of COVID 19 and the ways to protect against contracting the virus with telehealth technology.  I spent an additional 5 minutes reviewing patient's chart including PAP compliance download.  Medication Adjustments/Labs and Tests Ordered: Current medicines are reviewed at length with the patient today.  Concerns regarding medicines are outlined above.  Tests Ordered: No orders of the defined types were placed in this encounter.  Medication Changes: No orders of the defined types were placed in this encounter.   Disposition:  Follow up in 1 year(s)  Signed, Fransico Him, MD  11/18/2018 9:06 AM    [facility]

## 2018-11-17 NOTE — Telephone Encounter (Signed)
Left message for patient to return call. Need to find out if doing video or telephone visit and get consent for virtual visit with Dr Radford Pax.

## 2018-11-18 ENCOUNTER — Encounter: Payer: Self-pay | Admitting: Cardiology

## 2018-11-18 ENCOUNTER — Other Ambulatory Visit: Payer: Self-pay

## 2018-11-18 ENCOUNTER — Telehealth (INDEPENDENT_AMBULATORY_CARE_PROVIDER_SITE_OTHER): Payer: Medicare Other | Admitting: Cardiology

## 2018-11-18 VITALS — BP 127/72 | HR 67 | Ht 68.0 in | Wt 190.0 lb

## 2018-11-18 DIAGNOSIS — G4733 Obstructive sleep apnea (adult) (pediatric): Secondary | ICD-10-CM

## 2018-11-18 DIAGNOSIS — I10 Essential (primary) hypertension: Secondary | ICD-10-CM | POA: Diagnosis not present

## 2018-11-18 NOTE — Patient Instructions (Signed)

## 2018-12-14 ENCOUNTER — Ambulatory Visit: Payer: Medicare Other | Admitting: Cardiovascular Disease

## 2019-01-17 ENCOUNTER — Ambulatory Visit (INDEPENDENT_AMBULATORY_CARE_PROVIDER_SITE_OTHER): Payer: Medicare Other | Admitting: *Deleted

## 2019-01-17 DIAGNOSIS — I495 Sick sinus syndrome: Secondary | ICD-10-CM

## 2019-01-17 DIAGNOSIS — I459 Conduction disorder, unspecified: Secondary | ICD-10-CM | POA: Diagnosis not present

## 2019-01-17 LAB — CUP PACEART REMOTE DEVICE CHECK
Battery Remaining Longevity: 134 mo
Battery Remaining Percentage: 95.5 %
Battery Voltage: 3.01 V
Brady Statistic AP VP Percent: 1 %
Brady Statistic AP VS Percent: 1 %
Brady Statistic AS VP Percent: 1 %
Brady Statistic AS VS Percent: 99 %
Brady Statistic RA Percent Paced: 1 %
Brady Statistic RV Percent Paced: 1 %
Date Time Interrogation Session: 20200810091425
Implantable Lead Implant Date: 20171017
Implantable Lead Implant Date: 20171017
Implantable Lead Location: 753859
Implantable Lead Location: 753860
Implantable Pulse Generator Implant Date: 20171017
Lead Channel Impedance Value: 400 Ohm
Lead Channel Impedance Value: 480 Ohm
Lead Channel Pacing Threshold Amplitude: 0.375 V
Lead Channel Pacing Threshold Amplitude: 0.75 V
Lead Channel Pacing Threshold Pulse Width: 0.5 ms
Lead Channel Pacing Threshold Pulse Width: 0.5 ms
Lead Channel Sensing Intrinsic Amplitude: 4 mV
Lead Channel Sensing Intrinsic Amplitude: 9.1 mV
Lead Channel Setting Pacing Amplitude: 1.375
Lead Channel Setting Pacing Amplitude: 2.5 V
Lead Channel Setting Pacing Pulse Width: 0.5 ms
Lead Channel Setting Sensing Sensitivity: 2 mV
Pulse Gen Model: 2272
Pulse Gen Serial Number: 7946710

## 2019-01-26 NOTE — Progress Notes (Signed)
Remote pacemaker transmission.   

## 2019-03-06 ENCOUNTER — Encounter: Payer: Self-pay | Admitting: Cardiovascular Disease

## 2019-03-06 ENCOUNTER — Other Ambulatory Visit: Payer: Self-pay

## 2019-03-06 ENCOUNTER — Ambulatory Visit: Payer: Medicare Other | Admitting: Cardiovascular Disease

## 2019-03-06 VITALS — BP 110/65 | HR 70 | Ht 68.0 in | Wt 199.4 lb

## 2019-03-06 DIAGNOSIS — I25118 Atherosclerotic heart disease of native coronary artery with other forms of angina pectoris: Secondary | ICD-10-CM | POA: Diagnosis not present

## 2019-03-06 DIAGNOSIS — G4733 Obstructive sleep apnea (adult) (pediatric): Secondary | ICD-10-CM

## 2019-03-06 DIAGNOSIS — I495 Sick sinus syndrome: Secondary | ICD-10-CM | POA: Diagnosis not present

## 2019-03-06 DIAGNOSIS — Z950 Presence of cardiac pacemaker: Secondary | ICD-10-CM | POA: Diagnosis not present

## 2019-03-06 DIAGNOSIS — I10 Essential (primary) hypertension: Secondary | ICD-10-CM

## 2019-03-06 DIAGNOSIS — E785 Hyperlipidemia, unspecified: Secondary | ICD-10-CM

## 2019-03-06 NOTE — Progress Notes (Signed)
SUBJECTIVE: The patient presents for routine follow-up. He has a history of coronary artery disease with remote left circumflex coronary artery angioplasty without stent placement in 1995. He also has hyperlipidemia and hypertension. He also has sleep apnea and follows with Dr. Radford Pax.  He underwent dual-chamber pacemaker placement for symptomatic bradycardia and syncope in October 2017 by Dr. Rayann Heman.  The patient denies any symptoms of chest pain, palpitations, shortness of breath, lightheadedness, dizziness, leg swelling, orthopnea, PND, and syncope.     Review of Systems: As per "subjective", otherwise negative.  No Known Allergies  Current Outpatient Medications  Medication Sig Dispense Refill   aspirin EC 81 MG tablet Take 324 mg by mouth daily. Takes 4 tabs daily per neurologist - started 03/25/2017.     Cyanocobalamin (RA VITAMIN B-12 TR) 1000 MCG TBCR Take 1 tablet by mouth daily.     ezetimibe (ZETIA) 10 MG tablet Take 10 mg by mouth daily.     fluticasone (FLONASE) 50 MCG/ACT nasal spray Place 1 spray into both nostrils daily as needed for allergies.     losartan (COZAAR) 25 MG tablet Take 25 mg by mouth daily.     memantine (NAMENDA) 10 MG tablet Take 10 mg by mouth 2 (two) times daily.     Multiple Vitamin (MULTI-VITAMINS) TABS Take 1 tablet by mouth daily.     nitroGLYCERIN (NITROSTAT) 0.4 MG SL tablet Place 0.4 mg under the tongue every 5 (five) minutes as needed for chest pain.      rivastigmine (EXELON) 9.5 mg/24hr Place 9.5 mg onto the skin daily.     rosuvastatin (CRESTOR) 5 MG tablet Take 5 mg by mouth daily.     sertraline (ZOLOFT) 25 MG tablet Take 25 mg by mouth daily.     tamsulosin (FLOMAX) 0.4 MG CAPS capsule Take 0.4 mg by mouth daily.     ACCU-CHEK SOFTCLIX LANCETS lancets as directed.     Blood Glucose Calibration (ACCU-CHEK COMPACT PLUS CONTROL) SOLN as directed.     Blood Glucose Monitoring Suppl (ACCU-CHEK COMPACT CARE KIT) KIT as  directed.     glucose blood (ACCU-CHEK COMPACT PLUS) test strip as directed.     No current facility-administered medications for this visit.     Past Medical History:  Diagnosis Date   Coronary artery disease 1995    remote left circumflex angioplasty without stent placement in 1995   Excessive daytime sleepiness 11/22/2017   History of kidney stones    History of shingles    Hyperlipidemia    Hypertension    Melanoma ([place])    Memory loss    OSA treated with BiPAP    moderate obstructive sleep apnea with an AHI of 18.7/h and mild central sleep apnea with his CAI of 5.4/h.On BiPAP at 9/5cm H2O   Pre-diabetes    Presence of permanent cardiac pacemaker 03/24/2016   Sick sinus syndrome ([place]) 03/24/2016   Syncope 03/24/2016    Past Surgical History:  Procedure Laterality Date   EP IMPLANTABLE DEVICE N/A 03/24/2016   SJM Assurity MRI PPM implanted by Dr Rayann Heman for sick sinus syndrome   herniated disk repair      Social History   Socioeconomic History   Marital status: Married    Spouse name: Not on file   Number of children: Not on file   Years of education: Not on file   Highest education level: Not on file  Occupational History   Not on file  Social Needs  Financial resource strain: Not on file   Food insecurity    Worry: Not on file    Inability: Not on file   Transportation needs    Medical: Not on file    Non-medical: Not on file  Tobacco Use   Smoking status: Never Smoker   Smokeless tobacco: Never Used  Substance and Sexual Activity   Alcohol use: No   Drug use: No   Sexual activity: Yes    Partners: Female    Comment: married  Lifestyle   Physical activity    Days per week: Not on file    Minutes per session: Not on file   Stress: Not on file  Relationships   Social connections    Talks on phone: Not on file    Gets together: Not on file    Attends religious service: Not on file    Active member of club or  organization: Not on file    Attends meetings of clubs or organizations: Not on file    Relationship status: Not on file   Intimate partner violence    Fear of current or ex partner: Not on file    Emotionally abused: Not on file    Physically abused: Not on file    Forced sexual activity: Not on file  Other Topics Concern   Not on file  Social History Narrative   Not on file     Vitals:   03/06/19 1346  BP: 110/65  Pulse: 70  SpO2: 96%  Weight: 199 lb 6.4 oz (90.4 kg)  Height: '5\' 8"'  (1.727 m)    Wt Readings from Last 3 Encounters:  03/06/19 199 lb 6.4 oz (90.4 kg)  11/18/18 190 lb (86.2 kg)  07/08/18 194 lb (88 kg)     PHYSICAL EXAM General: NAD HEENT: Normal. Neck: No JVD, no thyromegaly. Lungs: Clear to auscultation bilaterally with normal respiratory effort. CV: Regular rate and rhythm, normal S1/S2, no S3/S4, no murmur. No pretibial or periankle edema.  No carotid bruit.   Abdomen: Soft, nontender, no distention.  Neurologic: Alert and oriented.  Psych: Normal affect. Skin: Normal. Musculoskeletal: No gross deformities.      Labs: Lab Results  Component Value Date/Time   K 4.5 03/24/2016 10:10 AM   BUN 17 03/24/2016 10:10 AM   CREATININE 1.00 03/24/2016 10:10 AM   ALT 46 03/24/2016 10:00 AM   TSH 2.350 03/24/2016 09:38 AM   HGB 16.0 03/24/2016 10:10 AM     Lipids: No results found for: LDLCALC, LDLDIRECT, CHOL, TRIG, HDL     ASSESSMENT AND PLAN:  1. CLE:[id number] stable. Continue aspirin and statin.  2. Hyperlipidemia:Continue Zetia and Crestor.  I will obtain a copy of lipids from PCP.  3. Hypertension:Blood pressure is normal. No changes.  4. Symptomatic bradycardia and syncope s/p pacemaker:Normal device function bymostrecent interrogation. Follows with Dr. Rayann Heman.  5. Sleep apnea: Managed by Dr. Radford Pax.  Uses BiPAP.     Disposition: Follow up 1 yr   Kate Sable, M.D., F.A.C.C.

## 2019-03-06 NOTE — Patient Instructions (Signed)

## 2019-04-19 ENCOUNTER — Ambulatory Visit (INDEPENDENT_AMBULATORY_CARE_PROVIDER_SITE_OTHER): Payer: Medicare Other | Admitting: *Deleted

## 2019-04-19 DIAGNOSIS — I459 Conduction disorder, unspecified: Secondary | ICD-10-CM

## 2019-04-19 DIAGNOSIS — I495 Sick sinus syndrome: Secondary | ICD-10-CM | POA: Diagnosis not present

## 2019-04-19 LAB — CUP PACEART REMOTE DEVICE CHECK
Battery Remaining Longevity: 134 mo
Battery Remaining Percentage: 95.5 %
Battery Voltage: 3.01 V
Brady Statistic AP VP Percent: 1 %
Brady Statistic AP VS Percent: 1 %
Brady Statistic AS VP Percent: 1 %
Brady Statistic AS VS Percent: 99 %
Brady Statistic RA Percent Paced: 1 %
Brady Statistic RV Percent Paced: 1 %
Date Time Interrogation Session: 20201109100214
Implantable Lead Implant Date: 20171017
Implantable Lead Implant Date: 20171017
Implantable Lead Location: 753859
Implantable Lead Location: 753860
Implantable Pulse Generator Implant Date: 20171017
Lead Channel Impedance Value: 400 Ohm
Lead Channel Impedance Value: 460 Ohm
Lead Channel Pacing Threshold Amplitude: 0.5 V
Lead Channel Pacing Threshold Amplitude: 0.75 V
Lead Channel Pacing Threshold Pulse Width: 0.5 ms
Lead Channel Pacing Threshold Pulse Width: 0.5 ms
Lead Channel Sensing Intrinsic Amplitude: 3.9 mV
Lead Channel Sensing Intrinsic Amplitude: 9.6 mV
Lead Channel Setting Pacing Amplitude: 1.5 V
Lead Channel Setting Pacing Amplitude: 2.5 V
Lead Channel Setting Pacing Pulse Width: 0.5 ms
Lead Channel Setting Sensing Sensitivity: 2 mV
Pulse Gen Model: 2272
Pulse Gen Serial Number: 7946710

## 2019-05-10 NOTE — Progress Notes (Signed)
Remote pacemaker transmission.   

## 2019-07-10 DIAGNOSIS — Z20828 Contact with and (suspected) exposure to other viral communicable diseases: Secondary | ICD-10-CM | POA: Diagnosis not present

## 2019-07-10 DIAGNOSIS — J309 Allergic rhinitis, unspecified: Secondary | ICD-10-CM | POA: Diagnosis not present

## 2019-07-14 ENCOUNTER — Telehealth (INDEPENDENT_AMBULATORY_CARE_PROVIDER_SITE_OTHER): Payer: Medicare PPO | Admitting: Internal Medicine

## 2019-07-14 ENCOUNTER — Encounter: Payer: Self-pay | Admitting: Internal Medicine

## 2019-07-14 VITALS — BP 148/81 | HR 69 | Ht 68.5 in | Wt 192.0 lb

## 2019-07-14 DIAGNOSIS — I495 Sick sinus syndrome: Secondary | ICD-10-CM

## 2019-07-14 DIAGNOSIS — I10 Essential (primary) hypertension: Secondary | ICD-10-CM

## 2019-07-14 DIAGNOSIS — G4733 Obstructive sleep apnea (adult) (pediatric): Secondary | ICD-10-CM | POA: Diagnosis not present

## 2019-07-14 NOTE — Progress Notes (Signed)
Electrophysiology TeleHealth Note  Due to national recommendations of social distancing due to [place] 19, an audio telehealth visit is felt to be most appropriate for this patient at this time.  Verbal consent was obtained by me for the telehealth visit today.  The patient does not have capability for a virtual visit.  A phone visit is therefore required today.   Date:  07/14/2019   ID:  Joel Nelson, DOB Jun 23, 1933, MRN [id number]  Location: patient's home  Provider location:  [place]  Evaluation Performed: Follow-up visit  PCP:  Manon Hilding, MD   Electrophysiologist:  Dr Rayann Heman  Chief Complaint:  Pacemaker followup  History of Present Illness:    Joel Nelson is a 86 y.o. male who presents via telehealth conferencing today.  Since last being seen in our clinic, the patient reports doing very well.  Today, he denies symptoms of palpitations, chest pain, shortness of breath,  lower extremity edema, dizziness, presyncope, or syncope.  The patient is otherwise without complaint today.  The patient denies symptoms of fevers, chills, cough, or new SOB worrisome for COVID 19.  Past Medical History:  Diagnosis Date  . Coronary artery disease 1995    remote left circumflex angioplasty without stent placement in 1995  . Excessive daytime sleepiness 11/22/2017  . History of kidney stones   . History of shingles   . Hyperlipidemia   . Hypertension   . Melanoma ([place])   . Memory loss   . OSA treated with BiPAP    moderate obstructive sleep apnea with an AHI of 18.7/h and mild central sleep apnea with his CAI of 5.4/h.On BiPAP at 9/5cm H2O  . Pre-diabetes   . Presence of permanent cardiac pacemaker 03/24/2016  . Sick sinus syndrome ([place]) 03/24/2016  . Syncope 03/24/2016    Past Surgical History:  Procedure Laterality Date  . EP IMPLANTABLE DEVICE N/A 03/24/2016   SJM Assurity MRI PPM implanted by Dr Rayann Heman for sick sinus syndrome  . herniated disk repair       Current Outpatient Medications  Medication Sig Dispense Refill  . ACCU-CHEK SOFTCLIX LANCETS lancets as directed.    Marland Kitchen aspirin EC 81 MG tablet Take 324 mg by mouth daily. Takes 4 tabs daily per neurologist - started 03/25/2017.    Marland Kitchen Blood Glucose Calibration (ACCU-CHEK COMPACT PLUS CONTROL) SOLN as directed.    . Blood Glucose Monitoring Suppl (ACCU-CHEK COMPACT CARE KIT) KIT as directed.    . Cyanocobalamin (RA VITAMIN B-12 TR) 1000 MCG TBCR Take 1 tablet by mouth daily.    Marland Kitchen ezetimibe (ZETIA) 10 MG tablet Take 10 mg by mouth daily.    . fluticasone (FLONASE) 50 MCG/ACT nasal spray Place 1 spray into both nostrils daily as needed for allergies.    Marland Kitchen glucose blood (ACCU-CHEK COMPACT PLUS) test strip as directed.    Marland Kitchen losartan (COZAAR) 25 MG tablet Take 25 mg by mouth daily.    . memantine (NAMENDA) 10 MG tablet Take 10 mg by mouth 2 (two) times daily.    . Multiple Vitamin (MULTI-VITAMINS) TABS Take 1 tablet by mouth daily.    . nitroGLYCERIN (NITROSTAT) 0.4 MG SL tablet Place 0.4 mg under the tongue every 5 (five) minutes as needed for chest pain.     . rivastigmine (EXELON) 9.5 mg/24hr Place 9.5 mg onto the skin daily.    . rosuvastatin (CRESTOR) 5 MG tablet Take 5 mg by mouth daily.    . sertraline (ZOLOFT) 50 MG tablet  Take 25 mg by mouth at bedtime.    . tamsulosin (FLOMAX) 0.4 MG CAPS capsule Take 0.4 mg by mouth daily.     No current facility-administered medications for this visit.    Allergies:   Patient has no known allergies.   Social History:  The patient  reports that he has never smoked. He has never used smokeless tobacco. He reports that he does not drink alcohol or use drugs.   Family History:  The patient's  family history includes Alzheimer's disease in his mother; Lung cancer in his sister.   ROS:  Please see the history of present illness.   All other systems are personally reviewed and negative.    Exam:    Vital Signs:  BP (!) 148/81 Comment: 06/12/2019   Pulse 69   Ht 5' 8.5" (1.74 m)   Wt 192 lb (87.1 kg)   BMI 28.77 kg/m   Well sounding, alert and conversant   Labs/Other Tests and Data Reviewed:    Recent Labs: No results found for requested labs within last 8760 hours.   Wt Readings from Last 3 Encounters:  07/14/19 192 lb (87.1 kg)  03/06/19 199 lb 6.4 oz (90.4 kg)  11/18/18 190 lb (86.2 kg)     Last device remote is reviewed from [place] PDF which reveals normal device function    ASSESSMENT & PLAN:    1.  Symptomatic sinus bradycardia Normal pacemaker function by last remote See [employer] report  2.  HTN Stable No change required today  3.  OSA Uses BiPap Followed by Dr Radford Pax   Follow-up:  Remotes, me in a year   Patient Risk:  after full review of this patients clinical status, I feel that they are at moderate risk at this time.  Today, I have spent 15 minutes with the patient with telehealth technology discussing arrhythmia management .    Army Fossa, MD  07/14/2019 9:35 AM     [facility] [address] [place] [address] [phone] (office) [phone] (fax)

## 2019-07-19 ENCOUNTER — Ambulatory Visit (INDEPENDENT_AMBULATORY_CARE_PROVIDER_SITE_OTHER): Payer: Medicare PPO | Admitting: *Deleted

## 2019-07-19 DIAGNOSIS — I495 Sick sinus syndrome: Secondary | ICD-10-CM | POA: Diagnosis not present

## 2019-07-19 LAB — CUP PACEART REMOTE DEVICE CHECK
Battery Remaining Longevity: 134 mo
Battery Remaining Percentage: 95.5 %
Battery Voltage: 3.01 V
Brady Statistic AP VP Percent: 1 %
Brady Statistic AP VS Percent: 1 %
Brady Statistic AS VP Percent: 1 %
Brady Statistic AS VS Percent: 99 %
Brady Statistic RA Percent Paced: 1 %
Brady Statistic RV Percent Paced: 1 %
Date Time Interrogation Session: 20210210020015
Implantable Lead Implant Date: 20171017
Implantable Lead Implant Date: 20171017
Implantable Lead Location: 753859
Implantable Lead Location: 753860
Implantable Pulse Generator Implant Date: 20171017
Lead Channel Impedance Value: 390 Ohm
Lead Channel Impedance Value: 440 Ohm
Lead Channel Pacing Threshold Amplitude: 0.375 V
Lead Channel Pacing Threshold Amplitude: 0.75 V
Lead Channel Pacing Threshold Pulse Width: 0.5 ms
Lead Channel Pacing Threshold Pulse Width: 0.5 ms
Lead Channel Sensing Intrinsic Amplitude: 3.2 mV
Lead Channel Sensing Intrinsic Amplitude: 7.3 mV
Lead Channel Setting Pacing Amplitude: 1.375
Lead Channel Setting Pacing Amplitude: 2.5 V
Lead Channel Setting Pacing Pulse Width: 0.5 ms
Lead Channel Setting Sensing Sensitivity: 2 mV
Pulse Gen Model: 2272
Pulse Gen Serial Number: 7946710

## 2019-07-19 NOTE — Progress Notes (Signed)
PPM Remote  

## 2019-07-26 ENCOUNTER — Telehealth: Payer: Self-pay | Admitting: *Deleted

## 2019-07-26 DIAGNOSIS — G4733 Obstructive sleep apnea (adult) (pediatric): Secondary | ICD-10-CM

## 2019-07-26 NOTE — Telephone Encounter (Signed)
Ok to order new water reservoir. If machine still does not work then will need to meet with dme.

## 2019-07-26 NOTE — Telephone Encounter (Signed)
Order placed to [facility].

## 2019-07-31 DIAGNOSIS — G4733 Obstructive sleep apnea (adult) (pediatric): Secondary | ICD-10-CM | POA: Diagnosis not present

## 2019-08-22 DIAGNOSIS — L821 Other seborrheic keratosis: Secondary | ICD-10-CM | POA: Diagnosis not present

## 2019-08-22 DIAGNOSIS — L570 Actinic keratosis: Secondary | ICD-10-CM | POA: Diagnosis not present

## 2019-08-22 DIAGNOSIS — Z85828 Personal history of other malignant neoplasm of skin: Secondary | ICD-10-CM | POA: Diagnosis not present

## 2019-08-31 DIAGNOSIS — H6692 Otitis media, unspecified, left ear: Secondary | ICD-10-CM | POA: Diagnosis not present

## 2019-10-12 DIAGNOSIS — N419 Inflammatory disease of prostate, unspecified: Secondary | ICD-10-CM | POA: Diagnosis not present

## 2019-10-18 ENCOUNTER — Ambulatory Visit (INDEPENDENT_AMBULATORY_CARE_PROVIDER_SITE_OTHER): Payer: Medicare PPO | Admitting: *Deleted

## 2019-10-18 DIAGNOSIS — I495 Sick sinus syndrome: Secondary | ICD-10-CM

## 2019-10-18 LAB — CUP PACEART REMOTE DEVICE CHECK
Battery Remaining Longevity: 134 mo
Battery Remaining Percentage: 95.5 %
Battery Voltage: 3.01 V
Brady Statistic AP VP Percent: 1 %
Brady Statistic AP VS Percent: 1 %
Brady Statistic AS VP Percent: 1 %
Brady Statistic AS VS Percent: 99 %
Brady Statistic RA Percent Paced: 1 %
Brady Statistic RV Percent Paced: 1 %
Date Time Interrogation Session: 20210512020012
Implantable Lead Implant Date: 20171017
Implantable Lead Implant Date: 20171017
Implantable Lead Location: 753859
Implantable Lead Location: 753860
Implantable Pulse Generator Implant Date: 20171017
Lead Channel Impedance Value: 400 Ohm
Lead Channel Impedance Value: 450 Ohm
Lead Channel Pacing Threshold Amplitude: 0.375 V
Lead Channel Pacing Threshold Amplitude: 0.75 V
Lead Channel Pacing Threshold Pulse Width: 0.5 ms
Lead Channel Pacing Threshold Pulse Width: 0.5 ms
Lead Channel Sensing Intrinsic Amplitude: 3.3 mV
Lead Channel Sensing Intrinsic Amplitude: 8.2 mV
Lead Channel Setting Pacing Amplitude: 1.375
Lead Channel Setting Pacing Amplitude: 2.5 V
Lead Channel Setting Pacing Pulse Width: 0.5 ms
Lead Channel Setting Sensing Sensitivity: 2 mV
Pulse Gen Model: 2272
Pulse Gen Serial Number: 7946710

## 2019-10-19 NOTE — Progress Notes (Signed)
Remote pacemaker transmission.   

## 2019-10-25 DIAGNOSIS — H43393 Other vitreous opacities, bilateral: Secondary | ICD-10-CM | POA: Diagnosis not present

## 2019-11-08 DIAGNOSIS — I10 Essential (primary) hypertension: Secondary | ICD-10-CM | POA: Diagnosis not present

## 2019-11-08 DIAGNOSIS — G301 Alzheimer's disease with late onset: Secondary | ICD-10-CM | POA: Diagnosis not present

## 2019-11-08 DIAGNOSIS — E1122 Type 2 diabetes mellitus with diabetic chronic kidney disease: Secondary | ICD-10-CM | POA: Diagnosis not present

## 2019-11-08 DIAGNOSIS — E8881 Metabolic syndrome: Secondary | ICD-10-CM | POA: Diagnosis not present

## 2019-11-08 DIAGNOSIS — E7800 Pure hypercholesterolemia, unspecified: Secondary | ICD-10-CM | POA: Diagnosis not present

## 2019-11-08 DIAGNOSIS — E782 Mixed hyperlipidemia: Secondary | ICD-10-CM | POA: Diagnosis not present

## 2019-11-14 DIAGNOSIS — E1122 Type 2 diabetes mellitus with diabetic chronic kidney disease: Secondary | ICD-10-CM | POA: Diagnosis not present

## 2019-11-14 DIAGNOSIS — E1169 Type 2 diabetes mellitus with other specified complication: Secondary | ICD-10-CM | POA: Diagnosis not present

## 2019-11-14 DIAGNOSIS — N400 Enlarged prostate without lower urinary tract symptoms: Secondary | ICD-10-CM | POA: Diagnosis not present

## 2019-11-14 DIAGNOSIS — E782 Mixed hyperlipidemia: Secondary | ICD-10-CM | POA: Diagnosis not present

## 2019-11-14 DIAGNOSIS — G309 Alzheimer's disease, unspecified: Secondary | ICD-10-CM | POA: Diagnosis not present

## 2019-11-14 DIAGNOSIS — I10 Essential (primary) hypertension: Secondary | ICD-10-CM | POA: Diagnosis not present

## 2019-11-14 DIAGNOSIS — Z1331 Encounter for screening for depression: Secondary | ICD-10-CM | POA: Diagnosis not present

## 2019-11-14 DIAGNOSIS — Z1389 Encounter for screening for other disorder: Secondary | ICD-10-CM | POA: Diagnosis not present

## 2019-11-17 ENCOUNTER — Telehealth: Payer: Self-pay | Admitting: *Deleted

## 2019-11-17 NOTE — Telephone Encounter (Signed)
°  Patient Consent for Virtual Visit         Joel Nelson has provided verbal consent on 11/17/2019 for a virtual visit (video or telephone).   CONSENT FOR VIRTUAL VISIT FOR:  Joel Nelson  By participating in this virtual visit I agree to the following:  I hereby voluntarily request, consent and authorize [facility] and its employed or contracted physicians, physician assistants, nurse practitioners or other licensed health care professionals (the Practitioner), to provide me with telemedicine health care services (the Services") as deemed necessary by the treating Practitioner. I acknowledge and consent to receive the Services by the Practitioner via telemedicine. I understand that the telemedicine visit will involve communicating with the Practitioner through live audiovisual communication technology and the disclosure of certain medical information by electronic transmission. I acknowledge that I have been given the opportunity to request an in-person assessment or other available alternative prior to the telemedicine visit and am voluntarily participating in the telemedicine visit.  I understand that I have the right to withhold or withdraw my consent to the use of telemedicine in the course of my care at any time, without affecting my right to future care or treatment, and that the Practitioner or I may terminate the telemedicine visit at any time. I understand that I have the right to inspect all information obtained and/or recorded in the course of the telemedicine visit and may receive copies of available information for a reasonable fee.  I understand that some of the potential risks of receiving the Services via telemedicine include:   Delay or interruption in medical evaluation due to technological equipment failure or disruption;  Information transmitted may not be sufficient (e.g. poor resolution of images) to allow for appropriate medical decision making by the Practitioner;  and/or   In rare instances, security protocols could fail, causing a breach of personal health information.  Furthermore, I acknowledge that it is my responsibility to provide information about my medical history, conditions and care that is complete and accurate to the best of my ability. I acknowledge that Practitioner's advice, recommendations, and/or decision may be based on factors not within their control, such as incomplete or inaccurate data provided by me or distortions of diagnostic images or specimens that may result from electronic transmissions. I understand that the practice of medicine is not an exact science and that Practitioner makes no warranties or guarantees regarding treatment outcomes. I acknowledge that a copy of this consent can be made available to me via my patient portal ([facility]), or I can request a printed copy by calling the office of [facility].    I understand that my insurance will be billed for this visit.   I have read or had this consent read to me.  I understand the contents of this consent, which adequately explains the benefits and risks of the Services being provided via telemedicine.   I have been provided ample opportunity to ask questions regarding this consent and the Services and have had my questions answered to my satisfaction.  I give my informed consent for the services to be provided through the use of telemedicine in my medical care

## 2019-11-23 ENCOUNTER — Telehealth (INDEPENDENT_AMBULATORY_CARE_PROVIDER_SITE_OTHER): Payer: Medicare PPO | Admitting: Cardiology

## 2019-11-23 ENCOUNTER — Encounter: Payer: Self-pay | Admitting: Cardiology

## 2019-11-23 ENCOUNTER — Other Ambulatory Visit: Payer: Self-pay

## 2019-11-23 VITALS — BP 134/70 | HR 67 | Ht 68.5 in | Wt 191.4 lb

## 2019-11-23 DIAGNOSIS — G4733 Obstructive sleep apnea (adult) (pediatric): Secondary | ICD-10-CM

## 2019-11-23 DIAGNOSIS — I10 Essential (primary) hypertension: Secondary | ICD-10-CM

## 2019-11-23 NOTE — Progress Notes (Signed)
Virtual Visit via Telephone Note   This visit type was conducted due to national recommendations for restrictions regarding the COVID-19 Pandemic (e.g. social distancing) in an effort to limit this patient's exposure and mitigate transmission in our community.  Due to his co-morbid illnesses, this patient is at least at moderate risk for complications without adequate follow up.  This format is felt to be most appropriate for this patient at this time.  The patient did not have access to video technology/had technical difficulties with video requiring transitioning to audio format only (telephone).  All issues noted in this document were discussed and addressed.  No physical exam could be performed with this format.  Please refer to the patient's chart for his  consent to telehealth for [facility].   Evaluation Performed:  Follow-up visit  This visit type was conducted due to national recommendations for restrictions regarding the COVID-19 Pandemic (e.g. social distancing).  This format is felt to be most appropriate for this patient at this time.  All issues noted in this document were discussed and addressed.  No physical exam was performed (except for noted visual exam findings with Video Visits).  Please refer to the patient's chart (MyChart message for video visits and phone note for telephone visits) for the patient's consent to telehealth for [facility].  Date:  11/23/2019   ID:  Joel Nelson, DOB Jul 02, 1933, MRN [id number]  Patient Location:  Home  Provider location:   [place]  PCP:  Manon Hilding, MD  Cardiologist:  Kate Sable, MD Sleep Medicine:  Fransico Him, MD Electrophysiologist:  Thompson Grayer, MD   Chief Complaint:  OSA, HTN  History of Present Illness:    Handy Mcloud is a 86 y.o. male who presents via audio/video conferencing for a telehealth visit today.    Bailen Geffre is an 86 y.o. male with a hx of CAD, hyperlipidemia and hypertension who  was referred by Dr. Bronson Ing for sleep study due to bradycardia as well as snoring.  He was found to have moderate obstructive sleep apnea with an AHI of 18.7/h and mild central sleep apnea with his CAI of 5.4/h.  Oxygen desaturations were noted down to 83%.  There was moderate snoring.  He had unsuccessful PAP titration due to ongoing respiratory events and was eventually titrated on BiPAP 9/5 centimeters H2O.   he is doing well with his CPAP device and thinks that he has gotten used to it.  He tolerates the mask and feels the pressure is adequate.  Since going on CPAP he feels rested in the am and has no significant daytime sleepiness.  He denies any significant mouth or nasal dryness or nasal congestion.  He does not think that he snores.     Prior CV studies:   The following studies were reviewed today:  PAP compliance download  Past Medical History:  Diagnosis Date  . Coronary artery disease 1995    remote left circumflex angioplasty without stent placement in 1995  . Excessive daytime sleepiness 11/22/2017  . History of kidney stones   . History of shingles   . Hyperlipidemia   . Hypertension   . Melanoma ([place])   . Memory loss   . OSA treated with BiPAP    moderate obstructive sleep apnea with an AHI of 18.7/h and mild central sleep apnea with his CAI of 5.4/h.On BiPAP at 9/5cm H2O  . Pre-diabetes   . Presence of permanent cardiac pacemaker 03/24/2016  . Sick sinus syndrome ([place]) 03/24/2016  .  Syncope 03/24/2016   Past Surgical History:  Procedure Laterality Date  . EP IMPLANTABLE DEVICE N/A 03/24/2016   SJM Assurity MRI PPM implanted by Dr Rayann Heman for sick sinus syndrome  . herniated disk repair       Current Meds  Medication Sig  . aspirin EC 81 MG tablet Take 324 mg by mouth daily. Takes 4 tabs daily per neurologist - started 03/25/2017.  . Cyanocobalamin (RA VITAMIN B-12 TR) 1000 MCG TBCR Take 1 tablet by mouth daily.  Marland Kitchen ezetimibe (ZETIA) 10 MG tablet Take 10 mg by  mouth daily.  . fluticasone (FLONASE) 50 MCG/ACT nasal spray Place 1 spray into both nostrils daily as needed for allergies.  Marland Kitchen losartan (COZAAR) 25 MG tablet Take 25 mg by mouth daily.  . memantine (NAMENDA) 10 MG tablet Take 10 mg by mouth 2 (two) times daily.  . Multiple Vitamin (MULTI-VITAMINS) TABS Take 1 tablet by mouth daily.  . nitroGLYCERIN (NITROSTAT) 0.4 MG SL tablet Place 0.4 mg under the tongue every 5 (five) minutes as needed for chest pain.   . rivastigmine (EXELON) 9.5 mg/24hr Place 9.5 mg onto the skin daily. Patches for memory  . rosuvastatin (CRESTOR) 5 MG tablet Take 5 mg by mouth daily.  . sertraline (ZOLOFT) 50 MG tablet Take 25 mg by mouth at bedtime.  . tamsulosin (FLOMAX) 0.4 MG CAPS capsule Take 0.4 mg by mouth daily.     Allergies:   Patient has no known allergies.   Social History   Tobacco Use  . Smoking status: Never Smoker  . Smokeless tobacco: Never Used  Vaping Use  . Vaping Use: Never used  Substance Use Topics  . Alcohol use: No  . Drug use: No     Family Hx: The patient's family history includes Alzheimer's disease in his mother; Lung cancer in his sister.  ROS:   Please see the history of present illness.     All other systems reviewed and are negative.   Labs/Other Tests and Data Reviewed:    Recent Labs: No results found for requested labs within last 8760 hours.   Recent Lipid Panel No results found for: CHOL, TRIG, HDL, CHOLHDL, LDLCALC, LDLDIRECT  Wt Readings from Last 3 Encounters:  11/23/19 191 lb 6.4 oz (86.8 kg)  07/14/19 192 lb (87.1 kg)  03/06/19 199 lb 6.4 oz (90.4 kg)     Objective:    Vital Signs:  BP 134/70   Pulse 67   Ht 5' 8.5" (1.74 m)   Wt 191 lb 6.4 oz (86.8 kg)   BMI 28.68 kg/m     ASSESSMENT & PLAN:    1.  OSA - the patient is tolerating PAP therapy well without any problems. The PAP download was reviewed today and showed an AHI of 3.4hr on 9/5 cm H2O with 20% compliance in using more than 4 hours  nightly.  The patient has been using and benefiting from PAP use and will continue to benefit from therapy. I have encouraged him to be more compliant with his device.  -I have recommended that he try some low dose melatonin to help sleep at night since he has a hard time getting to sleep with the mask on and then takes it off to sleep>>I told him to take 2mg  at night.  2.  Hypertension  -BP remains well controlled -continue Losartan 25mg  daily  Time:   Today, I have spent 15 minutes on telemedicine discussing medical problems including OSA as well as reviewing  patient's chart including PAP compliance download.  Medication Adjustments/Labs and Tests Ordered: Current medicines are reviewed at length with the patient today.  Concerns regarding medicines are outlined above.  Tests Ordered: No orders of the defined types were placed in this encounter.  Medication Changes: No orders of the defined types were placed in this encounter.   Disposition:  Follow up in 1 year(s)  Signed, Fransico Him, MD  11/23/2019 4:23 PM    [place]

## 2020-01-17 ENCOUNTER — Ambulatory Visit (INDEPENDENT_AMBULATORY_CARE_PROVIDER_SITE_OTHER): Payer: Medicare PPO | Admitting: *Deleted

## 2020-01-17 DIAGNOSIS — I495 Sick sinus syndrome: Secondary | ICD-10-CM | POA: Diagnosis not present

## 2020-01-17 LAB — CUP PACEART REMOTE DEVICE CHECK
Battery Remaining Longevity: 134 mo
Battery Remaining Percentage: 95.5 %
Battery Voltage: 3.01 V
Brady Statistic AP VP Percent: 1 %
Brady Statistic AP VS Percent: 1 %
Brady Statistic AS VP Percent: 1 %
Brady Statistic AS VS Percent: 99 %
Brady Statistic RA Percent Paced: 1 %
Brady Statistic RV Percent Paced: 1 %
Date Time Interrogation Session: 20210811020032
Implantable Lead Implant Date: 20171017
Implantable Lead Implant Date: 20171017
Implantable Lead Location: 753859
Implantable Lead Location: 753860
Implantable Pulse Generator Implant Date: 20171017
Lead Channel Impedance Value: 400 Ohm
Lead Channel Impedance Value: 460 Ohm
Lead Channel Pacing Threshold Amplitude: 0.375 V
Lead Channel Pacing Threshold Amplitude: 0.75 V
Lead Channel Pacing Threshold Pulse Width: 0.5 ms
Lead Channel Pacing Threshold Pulse Width: 0.5 ms
Lead Channel Sensing Intrinsic Amplitude: 3.2 mV
Lead Channel Sensing Intrinsic Amplitude: 6.9 mV
Lead Channel Setting Pacing Amplitude: 1.375
Lead Channel Setting Pacing Amplitude: 2.5 V
Lead Channel Setting Pacing Pulse Width: 0.5 ms
Lead Channel Setting Sensing Sensitivity: 2 mV
Pulse Gen Model: 2272
Pulse Gen Serial Number: 7946710

## 2020-01-19 NOTE — Progress Notes (Signed)
Remote pacemaker transmission.   

## 2020-02-16 DIAGNOSIS — H6123 Impacted cerumen, bilateral: Secondary | ICD-10-CM | POA: Diagnosis not present

## 2020-02-16 DIAGNOSIS — H903 Sensorineural hearing loss, bilateral: Secondary | ICD-10-CM | POA: Diagnosis not present

## 2020-03-04 DIAGNOSIS — L570 Actinic keratosis: Secondary | ICD-10-CM | POA: Diagnosis not present

## 2020-03-04 DIAGNOSIS — Z85828 Personal history of other malignant neoplasm of skin: Secondary | ICD-10-CM | POA: Diagnosis not present

## 2020-03-04 DIAGNOSIS — L821 Other seborrheic keratosis: Secondary | ICD-10-CM | POA: Diagnosis not present

## 2020-03-12 DIAGNOSIS — Z6829 Body mass index (BMI) 29.0-29.9, adult: Secondary | ICD-10-CM | POA: Diagnosis not present

## 2020-03-12 DIAGNOSIS — M1611 Unilateral primary osteoarthritis, right hip: Secondary | ICD-10-CM | POA: Diagnosis not present

## 2020-03-12 DIAGNOSIS — M25551 Pain in right hip: Secondary | ICD-10-CM | POA: Diagnosis not present

## 2020-03-14 DIAGNOSIS — E1122 Type 2 diabetes mellitus with diabetic chronic kidney disease: Secondary | ICD-10-CM | POA: Diagnosis not present

## 2020-03-14 DIAGNOSIS — R5383 Other fatigue: Secondary | ICD-10-CM | POA: Diagnosis not present

## 2020-03-14 DIAGNOSIS — E782 Mixed hyperlipidemia: Secondary | ICD-10-CM | POA: Diagnosis not present

## 2020-03-14 DIAGNOSIS — I10 Essential (primary) hypertension: Secondary | ICD-10-CM | POA: Diagnosis not present

## 2020-03-19 DIAGNOSIS — E782 Mixed hyperlipidemia: Secondary | ICD-10-CM | POA: Diagnosis not present

## 2020-03-19 DIAGNOSIS — E1122 Type 2 diabetes mellitus with diabetic chronic kidney disease: Secondary | ICD-10-CM | POA: Diagnosis not present

## 2020-03-19 DIAGNOSIS — Z23 Encounter for immunization: Secondary | ICD-10-CM | POA: Diagnosis not present

## 2020-03-19 DIAGNOSIS — N400 Enlarged prostate without lower urinary tract symptoms: Secondary | ICD-10-CM | POA: Diagnosis not present

## 2020-03-19 DIAGNOSIS — G309 Alzheimer's disease, unspecified: Secondary | ICD-10-CM | POA: Diagnosis not present

## 2020-03-19 DIAGNOSIS — E8881 Metabolic syndrome: Secondary | ICD-10-CM | POA: Diagnosis not present

## 2020-03-19 DIAGNOSIS — E1129 Type 2 diabetes mellitus with other diabetic kidney complication: Secondary | ICD-10-CM | POA: Diagnosis not present

## 2020-03-19 DIAGNOSIS — I10 Essential (primary) hypertension: Secondary | ICD-10-CM | POA: Diagnosis not present

## 2020-03-28 ENCOUNTER — Ambulatory Visit: Payer: Medicare PPO | Admitting: Cardiology

## 2020-03-28 ENCOUNTER — Other Ambulatory Visit: Payer: Self-pay

## 2020-03-28 ENCOUNTER — Encounter: Payer: Self-pay | Admitting: Cardiology

## 2020-03-28 VITALS — BP 110/60 | HR 59 | Ht 68.5 in | Wt 194.6 lb

## 2020-03-28 DIAGNOSIS — I495 Sick sinus syndrome: Secondary | ICD-10-CM | POA: Diagnosis not present

## 2020-03-28 DIAGNOSIS — I25118 Atherosclerotic heart disease of native coronary artery with other forms of angina pectoris: Secondary | ICD-10-CM | POA: Diagnosis not present

## 2020-03-28 DIAGNOSIS — G4733 Obstructive sleep apnea (adult) (pediatric): Secondary | ICD-10-CM | POA: Diagnosis not present

## 2020-03-28 NOTE — Progress Notes (Signed)
Cardiology Office Note:    Date:  03/28/2020   ID:  Joel Nelson, DOB August 05, 1933, MRN [id number]  PCP:  Manon Hilding, MD  [facility] Cardiologist:  Candee Furbish, MD  [facility] Electrophysiologist:  Thompson Grayer, MD   Referring MD: Manon Hilding, MD     History of Present Illness:    Joel Nelson is a 86 y.o. male up of coronary artery disease prior left circumflex artery PTCA only in 1995.  Has hypertension hyperlipidemia and sleep apnea followed by Dr. Radford Pax.  Has pacemaker followed by Dr. Rayann Heman.  This was implanted in October 2017.  Feels well without any fevers chills nausea vomiting syncope bleeding.  Past Medical History:  Diagnosis Date   Coronary artery disease 1995    remote left circumflex angioplasty without stent placement in 1995   Excessive daytime sleepiness 11/22/2017   History of kidney stones    History of shingles    Hyperlipidemia    Hypertension    Melanoma ([place])    Memory loss    OSA treated with BiPAP    moderate obstructive sleep apnea with an AHI of 18.7/h and mild central sleep apnea with his CAI of 5.4/h.On BiPAP at 9/5cm H2O   Pre-diabetes    Presence of permanent cardiac pacemaker 03/24/2016   Sick sinus syndrome ([place]) 03/24/2016   Syncope 03/24/2016    Past Surgical History:  Procedure Laterality Date   EP IMPLANTABLE DEVICE N/A 03/24/2016   SJM Assurity MRI PPM implanted by Dr Rayann Heman for sick sinus syndrome   herniated disk repair      Current Medications: Current Meds  Medication Sig   aspirin EC 81 MG tablet Take 324 mg by mouth daily. Takes 4 tabs daily per neurologist - started 03/25/2017.   Cyanocobalamin (RA VITAMIN B-12 TR) 1000 MCG TBCR Take 1 tablet by mouth daily.   ezetimibe (ZETIA) 10 MG tablet Take 10 mg by mouth daily.   fluticasone (FLONASE) 50 MCG/ACT nasal spray Place 1 spray into both nostrils daily as needed for allergies.   losartan (COZAAR) 25 MG tablet Take 25 mg by mouth  daily.   memantine (NAMENDA) 10 MG tablet Take 10 mg by mouth 2 (two) times daily.   Multiple Vitamin (MULTI-VITAMINS) TABS Take 1 tablet by mouth daily.   nitroGLYCERIN (NITROSTAT) 0.4 MG SL tablet Place 0.4 mg under the tongue every 5 (five) minutes as needed for chest pain.    rivastigmine (EXELON) 9.5 mg/24hr Place 9.5 mg onto the skin daily. Patches for memory   rosuvastatin (CRESTOR) 5 MG tablet Take 5 mg by mouth daily.   sertraline (ZOLOFT) 50 MG tablet Take 25 mg by mouth at bedtime.   tamsulosin (FLOMAX) 0.4 MG CAPS capsule Take 0.4 mg by mouth daily.     Allergies:   Patient has no known allergies.   Social History   Socioeconomic History   Marital status: Married    Spouse name: Not on file   Number of children: Not on file   Years of education: Not on file   Highest education level: Not on file  Occupational History   Not on file  Tobacco Use   Smoking status: Never Smoker   Smokeless tobacco: Never Used  Vaping Use   Vaping Use: Never used  Substance and Sexual Activity   Alcohol use: No   Drug use: No   Sexual activity: Yes    Partners: Female    Comment: married  Other Topics Concern  Not on file  Social History Narrative   Not on file   Social Determinants of Health   Financial Resource Strain:    Difficulty of Paying Living Expenses: Not on file  Food Insecurity:    Worried About [place] in the Last Year: Not on file   Ran Out of Food in the Last Year: Not on file  Transportation Needs:    Lack of Transportation (Medical): Not on file   Lack of Transportation (Non-Medical): Not on file  Physical Activity:    Days of Exercise per Week: Not on file   Minutes of Exercise per Session: Not on file  Stress:    Feeling of Stress : Not on file  Social Connections:    Frequency of Communication with Friends and Family: Not on file   Frequency of Social Gatherings with Friends and Family: Not on file   Attends  Religious Services: Not on file   Active Member of Clubs or Organizations: Not on file   Attends Archivist Meetings: Not on file   Marital Status: Not on file     Family History: The patient's family history includes Alzheimer's disease in his mother; Lung cancer in his sister.  ROS:   Please see the history of present illness.     All other systems reviewed and are negative.  EKGs/Labs/Other Studies Reviewed:    The following studies were reviewed today:  ECHO 2017 - Left ventricle: The cavity size was normal. Wall thickness was  normal. Systolic function was normal. The estimated ejection  fraction was in the range of 55% to 60%. Wall motion was normal;  there were no regional wall motion abnormalities. Doppler  parameters are consistent with abnormal left ventricular  relaxation (grade 1 diastolic dysfunction).  - Aortic valve: Mildly calcified annulus. Trileaflet; mildly  calcified leaflets.  - Mitral valve: Calcified annulus. There was mild regurgitation.  - Right atrium: Central venous pressure (est): 3 mm Hg.  - Tricuspid valve: There was mild regurgitation.  - Pulmonary arteries: PA peak pressure: 26 mm Hg (S).  - Pericardium, extracardiac: There was no pericardial effusion.   Impressions:   - Normal LV wall thickness with LVEF 55-60%. Grade 1 diastolic  dysfunction with normal estimated LV filling pressure. MAC with  mild mitral regurgitation. Mildly sclerotic aortic annulus. Mild  tricuspid regurgitation with PASP 26 mmHg.   EKG:  EKG is  ordered today.  The ekg ordered today demonstrates sinus rhythm 59, 1 AV paced beat.  Recent Labs: No results found for requested labs within last 8760 hours.  Recent Lipid Panel No results found for: CHOL, TRIG, HDL, CHOLHDL, VLDL, LDLCALC, LDLDIRECT    Physical Exam:    VS:  BP 110/60    Pulse (!) 59    Ht 5' 8.5" (1.74 m)    Wt 194 lb 9.6 oz (88.3 kg)    SpO2 96%    BMI 29.16 kg/m      Wt Readings from Last 3 Encounters:  03/28/20 194 lb 9.6 oz (88.3 kg)  11/23/19 191 lb 6.4 oz (86.8 kg)  07/14/19 192 lb (87.1 kg)     GEN:  Well nourished, well developed in no acute distress HEENT: Normal NECK: No JVD; No carotid bruits LYMPHATICS: No lymphadenopathy CARDIAC: RRR, no murmurs, rubs, gallops RESPIRATORY:  Clear to auscultation without rales, wheezing or rhonchi  ABDOMEN: Soft, non-tender, non-distended MUSCULOSKELETAL:  No edema; No deformity  SKIN: Warm and dry NEUROLOGIC:  Alert and  oriented x 3 PSYCHIATRIC:  Normal affect   ASSESSMENT:    1. Coronary artery disease of native artery of native heart with stable angina pectoris ([place])   2. Sick sinus syndrome ([place])   3. OSA treated with BiPAP    PLAN:    In order of problems listed above:  Coronary artery disease -Circumflex PTCA several years ago.  Doing well stable without any anginal symptoms.  Continue with goal-directed therapy, secondary prevention.  Hyperlipidemia -On both Crestor as well as Zetia.  LDL goals are less than 70.  Essential hypertension -Blood pressure has been under reasonable control.  No changes made.  Pacemaker -Symptomatic bradycardia previously, normal device function followed by Dr. Rayann Heman.  Obstructive sleep apnea -Dr. Radford Pax.  BiPAP.   Medication Adjustments/Labs and Tests Ordered: Current medicines are reviewed at length with the patient today.  Concerns regarding medicines are outlined above.  Orders Placed This Encounter  Procedures   EKG 12-Lead   No orders of the defined types were placed in this encounter.   Patient Instructions  Medication Instructions:  The current medical regimen is effective;  continue present plan and medications.  *If you need a refill on your cardiac medications before your next appointment, please call your pharmacy*  Follow-Up: At [facility], you and your health needs are our priority.  As part of our continuing mission  to provide you with exceptional heart care, we have created designated Provider Care Teams.  These Care Teams include your primary Cardiologist (physician) and Advanced Practice Providers (APPs -  Physician Assistants and Nurse Practitioners) who all work together to provide you with the care you need, when you need it.  We recommend signing up for the patient portal called "MyChart".  Sign up information is provided on this After Visit Summary.  MyChart is used to connect with patients for Virtual Visits (Telemedicine).  Patients are able to view lab/test results, encounter notes, upcoming appointments, etc.  Non-urgent messages can be sent to your provider as well.   To learn more about what you can do with MyChart, go to [email or web address].    Your next appointment:   12 month(s)  The format for your next appointment:   In Person  Provider:   Candee Furbish, MD   Thank you for choosing [facility]!!         Signed, Candee Furbish, MD  03/28/2020 5:10 PM    [place]

## 2020-03-28 NOTE — Patient Instructions (Signed)
Medication Instructions:  The current medical regimen is effective;  continue present plan and medications.  *If you need a refill on your cardiac medications before your next appointment, please call your pharmacy*  Follow-Up: At [facility], you and your health needs are our priority.  As part of our continuing mission to provide you with exceptional heart care, we have created designated Provider Care Teams.  These Care Teams include your primary Cardiologist (physician) and Advanced Practice Providers (APPs -  Physician Assistants and Nurse Practitioners) who all work together to provide you with the care you need, when you need it.  We recommend signing up for the patient portal called "MyChart".  Sign up information is provided on this After Visit Summary.  MyChart is used to connect with patients for Virtual Visits (Telemedicine).  Patients are able to view lab/test results, encounter notes, upcoming appointments, etc.  Non-urgent messages can be sent to your provider as well.   To learn more about what you can do with MyChart, go to [email or web address].    Your next appointment:   12 month(s)  The format for your next appointment:   In Person  Provider:   Mark Skains, MD   Thank you for choosing [facility]!!      

## 2020-04-16 DIAGNOSIS — Z79899 Other long term (current) drug therapy: Secondary | ICD-10-CM | POA: Diagnosis not present

## 2020-04-16 DIAGNOSIS — R41844 Frontal lobe and executive function deficit: Secondary | ICD-10-CM | POA: Diagnosis not present

## 2020-04-16 DIAGNOSIS — Z9989 Dependence on other enabling machines and devices: Secondary | ICD-10-CM | POA: Diagnosis not present

## 2020-04-16 DIAGNOSIS — G4733 Obstructive sleep apnea (adult) (pediatric): Secondary | ICD-10-CM | POA: Diagnosis not present

## 2020-04-17 ENCOUNTER — Ambulatory Visit (INDEPENDENT_AMBULATORY_CARE_PROVIDER_SITE_OTHER): Payer: Medicare PPO

## 2020-04-17 DIAGNOSIS — I495 Sick sinus syndrome: Secondary | ICD-10-CM | POA: Diagnosis not present

## 2020-04-17 LAB — CUP PACEART REMOTE DEVICE CHECK
Battery Remaining Longevity: 133 mo
Battery Remaining Percentage: 95.5 %
Battery Voltage: 2.99 V
Brady Statistic AP VP Percent: 1 %
Brady Statistic AP VS Percent: 1 %
Brady Statistic AS VP Percent: 1 %
Brady Statistic AS VS Percent: 99 %
Brady Statistic RA Percent Paced: 1 %
Brady Statistic RV Percent Paced: 1 %
Date Time Interrogation Session: 20211110035206
Implantable Lead Implant Date: 20171017
Implantable Lead Implant Date: 20171017
Implantable Lead Location: 753859
Implantable Lead Location: 753860
Implantable Pulse Generator Implant Date: 20171017
Lead Channel Impedance Value: 390 Ohm
Lead Channel Impedance Value: 450 Ohm
Lead Channel Pacing Threshold Amplitude: 0.5 V
Lead Channel Pacing Threshold Amplitude: 0.75 V
Lead Channel Pacing Threshold Pulse Width: 0.5 ms
Lead Channel Pacing Threshold Pulse Width: 0.5 ms
Lead Channel Sensing Intrinsic Amplitude: 3 mV
Lead Channel Sensing Intrinsic Amplitude: 7.3 mV
Lead Channel Setting Pacing Amplitude: 1.5 V
Lead Channel Setting Pacing Amplitude: 2.5 V
Lead Channel Setting Pacing Pulse Width: 0.5 ms
Lead Channel Setting Sensing Sensitivity: 2 mV
Pulse Gen Model: 2272
Pulse Gen Serial Number: 7946710

## 2020-04-18 NOTE — Progress Notes (Signed)
Remote pacemaker transmission.   

## 2020-04-27 DIAGNOSIS — J309 Allergic rhinitis, unspecified: Secondary | ICD-10-CM | POA: Diagnosis not present

## 2020-04-27 DIAGNOSIS — Z20828 Contact with and (suspected) exposure to other viral communicable diseases: Secondary | ICD-10-CM | POA: Diagnosis not present

## 2020-06-05 DIAGNOSIS — E7800 Pure hypercholesterolemia, unspecified: Secondary | ICD-10-CM | POA: Diagnosis not present

## 2020-06-05 DIAGNOSIS — E1169 Type 2 diabetes mellitus with other specified complication: Secondary | ICD-10-CM | POA: Diagnosis not present

## 2020-06-05 DIAGNOSIS — E1122 Type 2 diabetes mellitus with diabetic chronic kidney disease: Secondary | ICD-10-CM | POA: Diagnosis not present

## 2020-06-05 DIAGNOSIS — E8881 Metabolic syndrome: Secondary | ICD-10-CM | POA: Diagnosis not present

## 2020-06-05 DIAGNOSIS — E782 Mixed hyperlipidemia: Secondary | ICD-10-CM | POA: Diagnosis not present

## 2020-06-05 DIAGNOSIS — G301 Alzheimer's disease with late onset: Secondary | ICD-10-CM | POA: Diagnosis not present

## 2020-06-11 DIAGNOSIS — E782 Mixed hyperlipidemia: Secondary | ICD-10-CM | POA: Diagnosis not present

## 2020-06-11 DIAGNOSIS — E1129 Type 2 diabetes mellitus with other diabetic kidney complication: Secondary | ICD-10-CM | POA: Diagnosis not present

## 2020-06-11 DIAGNOSIS — N400 Enlarged prostate without lower urinary tract symptoms: Secondary | ICD-10-CM | POA: Diagnosis not present

## 2020-06-11 DIAGNOSIS — E1122 Type 2 diabetes mellitus with diabetic chronic kidney disease: Secondary | ICD-10-CM | POA: Diagnosis not present

## 2020-06-11 DIAGNOSIS — Z0001 Encounter for general adult medical examination with abnormal findings: Secondary | ICD-10-CM | POA: Diagnosis not present

## 2020-06-11 DIAGNOSIS — F0280 Dementia in other diseases classified elsewhere without behavioral disturbance: Secondary | ICD-10-CM | POA: Diagnosis not present

## 2020-06-11 DIAGNOSIS — I10 Essential (primary) hypertension: Secondary | ICD-10-CM | POA: Diagnosis not present

## 2020-06-11 DIAGNOSIS — E8881 Metabolic syndrome: Secondary | ICD-10-CM | POA: Diagnosis not present

## 2020-06-13 DIAGNOSIS — Z23 Encounter for immunization: Secondary | ICD-10-CM | POA: Diagnosis not present

## 2020-07-04 DIAGNOSIS — R197 Diarrhea, unspecified: Secondary | ICD-10-CM | POA: Diagnosis not present

## 2020-07-12 ENCOUNTER — Ambulatory Visit: Payer: Medicare PPO | Admitting: Internal Medicine

## 2020-07-12 ENCOUNTER — Encounter: Payer: Self-pay | Admitting: Internal Medicine

## 2020-07-12 VITALS — BP 132/70 | HR 61 | Ht 68.5 in | Wt 194.2 lb

## 2020-07-12 DIAGNOSIS — I10 Essential (primary) hypertension: Secondary | ICD-10-CM

## 2020-07-12 DIAGNOSIS — I495 Sick sinus syndrome: Secondary | ICD-10-CM | POA: Diagnosis not present

## 2020-07-12 DIAGNOSIS — I25118 Atherosclerotic heart disease of native coronary artery with other forms of angina pectoris: Secondary | ICD-10-CM

## 2020-07-12 LAB — CUP PACEART INCLINIC DEVICE CHECK
Battery Remaining Longevity: 147 mo
Battery Voltage: 3.01 V
Brady Statistic RA Percent Paced: 0.42 %
Brady Statistic RV Percent Paced: 0.15 %
Date Time Interrogation Session: 20220204104102
Implantable Lead Implant Date: 20171017
Implantable Lead Implant Date: 20171017
Implantable Lead Location: 753859
Implantable Lead Location: 753860
Implantable Pulse Generator Implant Date: 20171017
Lead Channel Impedance Value: 387.5 Ohm
Lead Channel Impedance Value: 450 Ohm
Lead Channel Pacing Threshold Amplitude: 0.5 V
Lead Channel Pacing Threshold Amplitude: 1 V
Lead Channel Pacing Threshold Pulse Width: 0.5 ms
Lead Channel Pacing Threshold Pulse Width: 0.5 ms
Lead Channel Sensing Intrinsic Amplitude: 2.7 mV
Lead Channel Sensing Intrinsic Amplitude: 7.8 mV
Lead Channel Setting Pacing Amplitude: 1.5 V
Lead Channel Setting Pacing Amplitude: 2.5 V
Lead Channel Setting Pacing Pulse Width: 0.5 ms
Lead Channel Setting Sensing Sensitivity: 2 mV
Pulse Gen Model: 2272
Pulse Gen Serial Number: 7946710

## 2020-07-12 NOTE — Progress Notes (Signed)
PCP: Manon Hilding, MD Primary Cardiologist: Dr Marlou Porch Primary EP:  Dr Rayann Heman  Joel Nelson is a 87 y.o. male who presents today for routine electrophysiology followup.  Since last being seen in our clinic, the patient reports doing very well.  Today, he denies symptoms of palpitations, chest pain, shortness of breath,  lower extremity edema, dizziness, presyncope, or syncope.  The patient is otherwise without complaint today.   Past Medical History:  Diagnosis Date  . Coronary artery disease 1995    remote left circumflex angioplasty without stent placement in 1995  . Excessive daytime sleepiness 11/22/2017  . History of kidney stones   . History of shingles   . Hyperlipidemia   . Hypertension   . Melanoma ([place])   . Memory loss   . OSA treated with BiPAP    moderate obstructive sleep apnea with an AHI of 18.7/h and mild central sleep apnea with his CAI of 5.4/h.On BiPAP at 9/5cm H2O  . Pre-diabetes   . Presence of permanent cardiac pacemaker 03/24/2016  . Sick sinus syndrome ([place]) 03/24/2016  . Syncope 03/24/2016   Past Surgical History:  Procedure Laterality Date  . EP IMPLANTABLE DEVICE N/A 03/24/2016   SJM Assurity MRI PPM implanted by Dr Rayann Heman for sick sinus syndrome  . herniated disk repair      ROS- all systems are reviewed and negative except as per HPI above  Current Outpatient Medications  Medication Sig Dispense Refill  . aspirin EC 81 MG tablet Take 324 mg by mouth daily. Takes 4 tabs daily per neurologist - started 03/25/2017.    . Cyanocobalamin 1000 MCG TBCR Take 1 tablet by mouth daily.    Marland Kitchen ezetimibe (ZETIA) 10 MG tablet Take 10 mg by mouth daily.    . fluticasone (FLONASE) 50 MCG/ACT nasal spray Place 1 spray into both nostrils daily as needed for allergies.    Marland Kitchen losartan (COZAAR) 25 MG tablet Take 25 mg by mouth daily.    . memantine (NAMENDA) 10 MG tablet Take 10 mg by mouth 2 (two) times daily.    . Multiple Vitamin (MULTI-VITAMINS) TABS Take 1  tablet by mouth daily.    . nitroGLYCERIN (NITROSTAT) 0.4 MG SL tablet Place 0.4 mg under the tongue every 5 (five) minutes as needed for chest pain.     . rivastigmine (EXELON) 13.3 MG/24HR Place 13.3 mg onto the skin daily.    . rosuvastatin (CRESTOR) 5 MG tablet Take 5 mg by mouth daily.    . sertraline (ZOLOFT) 50 MG tablet Take 25 mg by mouth at bedtime.    . tamsulosin (FLOMAX) 0.4 MG CAPS capsule Take 0.4 mg by mouth daily.     No current facility-administered medications for this visit.    Physical Exam: Vitals:   07/12/20 1026  BP: 132/70  Pulse: 61  SpO2: 94%  Weight: 194 lb 3.2 oz (88.1 kg)  Height: 5' 8.5" (1.74 m)    GEN- The patient is well appearing, alert and oriented x 3 today.   Head- normocephalic, atraumatic Eyes-  Sclera clear, conjunctiva pink Ears- hearing intact Oropharynx- clear Lungs- Clear to ausculation bilaterally, normal work of breathing Chest- pacemaker pocket is well healed Heart- Regular rate and rhythm, no murmurs, rubs or gallops, PMI not laterally displaced GI- soft, NT, ND, + BS Extremities- no clubbing, cyanosis, or edema  Pacemaker interrogation- reviewed in detail today,  See PACEART report   Assessment and Plan:  1. Symptomatic sinus bradycardia  Normal pacemaker  function See Claudia Desanctis Art report No changes today he is not device dependant today  2. HTN Stable No change required today  3. OSA Follows with Dr Radford Pax  4. CAD No ischemic symptoms  Risks, benefits and potential toxicities for medications prescribed and/or refilled reviewed with patient today.   Return in a year  Thompson Grayer MD, [facility] 07/12/2020 10:33 AM

## 2020-07-12 NOTE — Patient Instructions (Signed)
Medication Instructions:  Continue all current medications.  Labwork: none  Testing/Procedures: none  Follow-Up: 1 year   Any Other Special Instructions Will Be Listed Below (If Applicable).  If you need a refill on your cardiac medications before your next appointment, please call your pharmacy.  

## 2020-07-17 ENCOUNTER — Ambulatory Visit (INDEPENDENT_AMBULATORY_CARE_PROVIDER_SITE_OTHER): Payer: Medicare PPO

## 2020-07-17 DIAGNOSIS — I495 Sick sinus syndrome: Secondary | ICD-10-CM

## 2020-07-19 LAB — CUP PACEART REMOTE DEVICE CHECK
Battery Remaining Longevity: 130 mo
Battery Remaining Percentage: 95.5 %
Battery Voltage: 3.01 V
Brady Statistic AP VP Percent: 1 %
Brady Statistic AP VS Percent: 2.7 %
Brady Statistic AS VP Percent: 1 %
Brady Statistic AS VS Percent: 97 %
Brady Statistic RA Percent Paced: 1.9 %
Brady Statistic RV Percent Paced: 1 %
Date Time Interrogation Session: 20220209223700
Implantable Lead Implant Date: 20171017
Implantable Lead Implant Date: 20171017
Implantable Lead Location: 753859
Implantable Lead Location: 753860
Implantable Pulse Generator Implant Date: 20171017
Lead Channel Impedance Value: 390 Ohm
Lead Channel Impedance Value: 460 Ohm
Lead Channel Pacing Threshold Amplitude: 0.5 V
Lead Channel Pacing Threshold Amplitude: 1 V
Lead Channel Pacing Threshold Pulse Width: 0.5 ms
Lead Channel Pacing Threshold Pulse Width: 0.5 ms
Lead Channel Sensing Intrinsic Amplitude: 3.2 mV
Lead Channel Sensing Intrinsic Amplitude: 7.4 mV
Lead Channel Setting Pacing Amplitude: 1.5 V
Lead Channel Setting Pacing Amplitude: 2.5 V
Lead Channel Setting Pacing Pulse Width: 0.5 ms
Lead Channel Setting Sensing Sensitivity: 2 mV
Pulse Gen Model: 2272
Pulse Gen Serial Number: 7946710

## 2020-07-23 NOTE — Progress Notes (Signed)
Remote pacemaker transmission.   

## 2020-09-03 DIAGNOSIS — Z85828 Personal history of other malignant neoplasm of skin: Secondary | ICD-10-CM | POA: Diagnosis not present

## 2020-09-03 DIAGNOSIS — L570 Actinic keratosis: Secondary | ICD-10-CM | POA: Diagnosis not present

## 2020-09-03 DIAGNOSIS — L821 Other seborrheic keratosis: Secondary | ICD-10-CM | POA: Diagnosis not present

## 2020-09-04 DIAGNOSIS — R197 Diarrhea, unspecified: Secondary | ICD-10-CM | POA: Diagnosis not present

## 2020-09-04 DIAGNOSIS — Z6828 Body mass index (BMI) 28.0-28.9, adult: Secondary | ICD-10-CM | POA: Diagnosis not present

## 2020-10-01 DIAGNOSIS — E7800 Pure hypercholesterolemia, unspecified: Secondary | ICD-10-CM | POA: Diagnosis not present

## 2020-10-01 DIAGNOSIS — E782 Mixed hyperlipidemia: Secondary | ICD-10-CM | POA: Diagnosis not present

## 2020-10-01 DIAGNOSIS — E1122 Type 2 diabetes mellitus with diabetic chronic kidney disease: Secondary | ICD-10-CM | POA: Diagnosis not present

## 2020-10-01 DIAGNOSIS — E7801 Familial hypercholesterolemia: Secondary | ICD-10-CM | POA: Diagnosis not present

## 2020-10-01 DIAGNOSIS — E1169 Type 2 diabetes mellitus with other specified complication: Secondary | ICD-10-CM | POA: Diagnosis not present

## 2020-10-01 DIAGNOSIS — I10 Essential (primary) hypertension: Secondary | ICD-10-CM | POA: Diagnosis not present

## 2020-10-01 DIAGNOSIS — E7849 Other hyperlipidemia: Secondary | ICD-10-CM | POA: Diagnosis not present

## 2020-10-09 DIAGNOSIS — F0280 Dementia in other diseases classified elsewhere without behavioral disturbance: Secondary | ICD-10-CM | POA: Diagnosis not present

## 2020-10-09 DIAGNOSIS — E782 Mixed hyperlipidemia: Secondary | ICD-10-CM | POA: Diagnosis not present

## 2020-10-09 DIAGNOSIS — N400 Enlarged prostate without lower urinary tract symptoms: Secondary | ICD-10-CM | POA: Diagnosis not present

## 2020-10-09 DIAGNOSIS — E1122 Type 2 diabetes mellitus with diabetic chronic kidney disease: Secondary | ICD-10-CM | POA: Diagnosis not present

## 2020-10-09 DIAGNOSIS — G309 Alzheimer's disease, unspecified: Secondary | ICD-10-CM | POA: Diagnosis not present

## 2020-10-09 DIAGNOSIS — R7301 Impaired fasting glucose: Secondary | ICD-10-CM | POA: Diagnosis not present

## 2020-10-09 DIAGNOSIS — I10 Essential (primary) hypertension: Secondary | ICD-10-CM | POA: Diagnosis not present

## 2020-10-09 DIAGNOSIS — R809 Proteinuria, unspecified: Secondary | ICD-10-CM | POA: Diagnosis not present

## 2020-10-09 DIAGNOSIS — E7849 Other hyperlipidemia: Secondary | ICD-10-CM | POA: Diagnosis not present

## 2020-10-09 DIAGNOSIS — E1129 Type 2 diabetes mellitus with other diabetic kidney complication: Secondary | ICD-10-CM | POA: Diagnosis not present

## 2020-10-16 ENCOUNTER — Ambulatory Visit (INDEPENDENT_AMBULATORY_CARE_PROVIDER_SITE_OTHER): Payer: Medicare PPO

## 2020-10-16 DIAGNOSIS — I495 Sick sinus syndrome: Secondary | ICD-10-CM | POA: Diagnosis not present

## 2020-10-16 LAB — CUP PACEART REMOTE DEVICE CHECK
Battery Remaining Longevity: 131 mo
Battery Remaining Percentage: 95.5 %
Battery Voltage: 2.99 V
Brady Statistic AP VP Percent: 1 %
Brady Statistic AP VS Percent: 1.7 %
Brady Statistic AS VP Percent: 1 %
Brady Statistic AS VS Percent: 98 %
Brady Statistic RA Percent Paced: 1.5 %
Brady Statistic RV Percent Paced: 1 %
Date Time Interrogation Session: 20220511020025
Implantable Lead Implant Date: 20171017
Implantable Lead Implant Date: 20171017
Implantable Lead Location: 753859
Implantable Lead Location: 753860
Implantable Pulse Generator Implant Date: 20171017
Lead Channel Impedance Value: 390 Ohm
Lead Channel Impedance Value: 440 Ohm
Lead Channel Pacing Threshold Amplitude: 0.5 V
Lead Channel Pacing Threshold Amplitude: 1 V
Lead Channel Pacing Threshold Pulse Width: 0.5 ms
Lead Channel Pacing Threshold Pulse Width: 0.5 ms
Lead Channel Sensing Intrinsic Amplitude: 2.5 mV
Lead Channel Sensing Intrinsic Amplitude: 7.7 mV
Lead Channel Setting Pacing Amplitude: 1.5 V
Lead Channel Setting Pacing Amplitude: 2.5 V
Lead Channel Setting Pacing Pulse Width: 0.5 ms
Lead Channel Setting Sensing Sensitivity: 2 mV
Pulse Gen Model: 2272
Pulse Gen Serial Number: 7946710

## 2020-11-07 NOTE — Progress Notes (Signed)
Remote pacemaker transmission.   

## 2020-11-12 DIAGNOSIS — H6122 Impacted cerumen, left ear: Secondary | ICD-10-CM | POA: Diagnosis not present

## 2020-11-12 DIAGNOSIS — H903 Sensorineural hearing loss, bilateral: Secondary | ICD-10-CM | POA: Diagnosis not present

## 2020-11-12 DIAGNOSIS — J3489 Other specified disorders of nose and nasal sinuses: Secondary | ICD-10-CM | POA: Diagnosis not present

## 2021-01-06 DIAGNOSIS — G4733 Obstructive sleep apnea (adult) (pediatric): Secondary | ICD-10-CM | POA: Diagnosis not present

## 2021-01-15 ENCOUNTER — Ambulatory Visit (INDEPENDENT_AMBULATORY_CARE_PROVIDER_SITE_OTHER): Payer: Medicare PPO

## 2021-01-15 DIAGNOSIS — I495 Sick sinus syndrome: Secondary | ICD-10-CM | POA: Diagnosis not present

## 2021-01-15 LAB — CUP PACEART REMOTE DEVICE CHECK
Battery Remaining Longevity: 83 mo
Battery Remaining Percentage: 65 %
Battery Voltage: 2.99 V
Brady Statistic AP VP Percent: 1 %
Brady Statistic AP VS Percent: 2.1 %
Brady Statistic AS VP Percent: 1 %
Brady Statistic AS VS Percent: 97 %
Brady Statistic RA Percent Paced: 1.8 %
Brady Statistic RV Percent Paced: 1 %
Date Time Interrogation Session: 20220810020014
Implantable Lead Implant Date: 20171017
Implantable Lead Implant Date: 20171017
Implantable Lead Location: 753859
Implantable Lead Location: 753860
Implantable Pulse Generator Implant Date: 20171017
Lead Channel Impedance Value: 390 Ohm
Lead Channel Impedance Value: 450 Ohm
Lead Channel Pacing Threshold Amplitude: 0.5 V
Lead Channel Pacing Threshold Amplitude: 1 V
Lead Channel Pacing Threshold Pulse Width: 0.5 ms
Lead Channel Pacing Threshold Pulse Width: 0.5 ms
Lead Channel Sensing Intrinsic Amplitude: 3.7 mV
Lead Channel Sensing Intrinsic Amplitude: 7.9 mV
Lead Channel Setting Pacing Amplitude: 1.5 V
Lead Channel Setting Pacing Amplitude: 2.5 V
Lead Channel Setting Pacing Pulse Width: 0.5 ms
Lead Channel Setting Sensing Sensitivity: 2 mV
Pulse Gen Model: 2272
Pulse Gen Serial Number: 7946710

## 2021-02-04 DIAGNOSIS — H43393 Other vitreous opacities, bilateral: Secondary | ICD-10-CM | POA: Diagnosis not present

## 2021-02-06 NOTE — Progress Notes (Signed)
Remote pacemaker transmission.   

## 2021-02-18 DIAGNOSIS — I10 Essential (primary) hypertension: Secondary | ICD-10-CM | POA: Diagnosis not present

## 2021-02-18 DIAGNOSIS — E782 Mixed hyperlipidemia: Secondary | ICD-10-CM | POA: Diagnosis not present

## 2021-02-18 DIAGNOSIS — E1169 Type 2 diabetes mellitus with other specified complication: Secondary | ICD-10-CM | POA: Diagnosis not present

## 2021-02-18 DIAGNOSIS — E1122 Type 2 diabetes mellitus with diabetic chronic kidney disease: Secondary | ICD-10-CM | POA: Diagnosis not present

## 2021-02-18 DIAGNOSIS — E7800 Pure hypercholesterolemia, unspecified: Secondary | ICD-10-CM | POA: Diagnosis not present

## 2021-02-20 DIAGNOSIS — Z1389 Encounter for screening for other disorder: Secondary | ICD-10-CM | POA: Diagnosis not present

## 2021-02-20 DIAGNOSIS — E1122 Type 2 diabetes mellitus with diabetic chronic kidney disease: Secondary | ICD-10-CM | POA: Diagnosis not present

## 2021-02-20 DIAGNOSIS — Z23 Encounter for immunization: Secondary | ICD-10-CM | POA: Diagnosis not present

## 2021-02-20 DIAGNOSIS — E1129 Type 2 diabetes mellitus with other diabetic kidney complication: Secondary | ICD-10-CM | POA: Diagnosis not present

## 2021-02-20 DIAGNOSIS — I10 Essential (primary) hypertension: Secondary | ICD-10-CM | POA: Diagnosis not present

## 2021-02-20 DIAGNOSIS — E7849 Other hyperlipidemia: Secondary | ICD-10-CM | POA: Diagnosis not present

## 2021-02-20 DIAGNOSIS — N400 Enlarged prostate without lower urinary tract symptoms: Secondary | ICD-10-CM | POA: Diagnosis not present

## 2021-02-20 DIAGNOSIS — Z1331 Encounter for screening for depression: Secondary | ICD-10-CM | POA: Diagnosis not present

## 2021-03-17 DIAGNOSIS — J209 Acute bronchitis, unspecified: Secondary | ICD-10-CM | POA: Diagnosis not present

## 2021-03-17 DIAGNOSIS — Z20828 Contact with and (suspected) exposure to other viral communicable diseases: Secondary | ICD-10-CM | POA: Diagnosis not present

## 2021-03-18 DIAGNOSIS — L570 Actinic keratosis: Secondary | ICD-10-CM | POA: Diagnosis not present

## 2021-03-21 DIAGNOSIS — J209 Acute bronchitis, unspecified: Secondary | ICD-10-CM | POA: Diagnosis not present

## 2021-03-28 DIAGNOSIS — Z6828 Body mass index (BMI) 28.0-28.9, adult: Secondary | ICD-10-CM | POA: Diagnosis not present

## 2021-03-28 DIAGNOSIS — R21 Rash and other nonspecific skin eruption: Secondary | ICD-10-CM | POA: Diagnosis not present

## 2021-03-28 DIAGNOSIS — M545 Low back pain, unspecified: Secondary | ICD-10-CM | POA: Diagnosis not present

## 2021-04-14 ENCOUNTER — Encounter: Payer: Self-pay | Admitting: Cardiology

## 2021-04-14 ENCOUNTER — Ambulatory Visit: Payer: Medicare PPO | Admitting: Cardiology

## 2021-04-14 ENCOUNTER — Other Ambulatory Visit: Payer: Self-pay

## 2021-04-14 VITALS — BP 124/70 | HR 69 | Ht 68.5 in | Wt 188.0 lb

## 2021-04-14 DIAGNOSIS — G4733 Obstructive sleep apnea (adult) (pediatric): Secondary | ICD-10-CM

## 2021-04-14 DIAGNOSIS — R413 Other amnesia: Secondary | ICD-10-CM | POA: Insufficient documentation

## 2021-04-14 DIAGNOSIS — I25118 Atherosclerotic heart disease of native coronary artery with other forms of angina pectoris: Secondary | ICD-10-CM | POA: Diagnosis not present

## 2021-04-14 DIAGNOSIS — I495 Sick sinus syndrome: Secondary | ICD-10-CM | POA: Diagnosis not present

## 2021-04-14 DIAGNOSIS — I2510 Atherosclerotic heart disease of native coronary artery without angina pectoris: Secondary | ICD-10-CM | POA: Diagnosis not present

## 2021-04-14 DIAGNOSIS — I10 Essential (primary) hypertension: Secondary | ICD-10-CM

## 2021-04-14 NOTE — Assessment & Plan Note (Signed)
Had PTCA to circumflex artery without stent in 1995.  Doing well no anginal symptoms.  Continue with goal-directed medical therapy including aspirin statin angiotensin receptor blocker.

## 2021-04-14 NOTE — Assessment & Plan Note (Signed)
Pacemaker in place.  Monitored previously by Dr. Rayann Heman.  Excellent.

## 2021-04-14 NOTE — Assessment & Plan Note (Signed)
Currently on Exelon as well as Namenda.  His wife [name] thinks that the Exelon has helped stabilize things.  Excellent.

## 2021-04-14 NOTE — Assessment & Plan Note (Signed)
Well-controlled no changes made today.  On losartan 25 mg a day.

## 2021-04-14 NOTE — Patient Instructions (Signed)
Medication Instructions:  The current medical regimen is effective;  continue present plan and medications.  *If you need a refill on your cardiac medications before your next appointment, please call your pharmacy*  Follow-Up: At [facility], you and your health needs are our priority.  As part of our continuing mission to provide you with exceptional heart care, we have created designated Provider Care Teams.  These Care Teams include your primary Cardiologist (physician) and Advanced Practice Providers (APPs -  Physician Assistants and Nurse Practitioners) who all work together to provide you with the care you need, when you need it.  We recommend signing up for the patient portal called "MyChart".  Sign up information is provided on this After Visit Summary.  MyChart is used to connect with patients for Virtual Visits (Telemedicine).  Patients are able to view lab/test results, encounter notes, upcoming appointments, etc.  Non-urgent messages can be sent to your provider as well.   To learn more about what you can do with MyChart, go to [email or web address].    Your next appointment:   1 year(s)  The format for your next appointment:   In Person  Provider:   Mark Skains, MD   Thank you for choosing [facility]!!    

## 2021-04-14 NOTE — Progress Notes (Signed)
Cardiology Office Note:    Date:  04/14/2021   ID:  [name], DOB February 22, 1934, MRN [id number]  PCP:  Manon Hilding, MD  [facility] Cardiologist:  Candee Furbish, MD  [facility] Electrophysiologist:  Thompson Grayer, MD   Referring MD: Manon Hilding, MD   History of Present Illness:    Joel Nelson is a 87 y.o. male with hx of coronary artery disease prior left circumflex artery PTCA only in 1995 here for follow-up.  Has hypertension hyperlipidemia and sleep apnea followed by Dr. Radford Pax.  Has pacemaker followed by Dr. Rayann Heman.  This was implanted in October 2017.  Today, he is accompanied by a his wife, [name]. Overall, he has been doing okay. He is tolerating his medicine and his cholesterol is well-controlled. His wife does report he has occasional memory issues that have been worsening. However, he is still able to drive.  He denies any palpitations, chest pain, or shortness of breath. No lightheadedness, headaches, syncope, orthopnea, PND, lower extremity edema or exertional symptoms.  Past Medical History:  Diagnosis Date   Coronary artery disease 1995    remote left circumflex angioplasty without stent placement in 1995   Excessive daytime sleepiness 11/22/2017   History of kidney stones    History of shingles    Hyperlipidemia    Hypertension    Melanoma ([place])    Memory loss    OSA treated with BiPAP    moderate obstructive sleep apnea with an AHI of 18.7/h and mild central sleep apnea with his CAI of 5.4/h.On BiPAP at 9/5cm H2O   Pre-diabetes    Presence of permanent cardiac pacemaker 03/24/2016   Sick sinus syndrome ([place]) 03/24/2016   Syncope 03/24/2016    Past Surgical History:  Procedure Laterality Date   EP IMPLANTABLE DEVICE N/A 03/24/2016   SJM Assurity MRI PPM implanted by Dr Rayann Heman for sick sinus syndrome   herniated disk repair      Current Medications: Current Meds  Medication Sig   aspirin EC 81 MG tablet Take 81 mg by mouth in the morning  and at bedtime.   Cyanocobalamin 1000 MCG TBCR Take 1 tablet by mouth daily.   ezetimibe (ZETIA) 10 MG tablet Take 10 mg by mouth daily.   fluticasone (FLONASE) 50 MCG/ACT nasal spray Place 1 spray into both nostrils daily as needed for allergies.   losartan (COZAAR) 25 MG tablet Take 25 mg by mouth daily.   memantine (NAMENDA) 10 MG tablet Take 10 mg by mouth daily.   Multiple Vitamin (MULTI-VITAMINS) TABS Take 1 tablet by mouth daily.   nitroGLYCERIN (NITROSTAT) 0.4 MG SL tablet Place 0.4 mg under the tongue every 5 (five) minutes as needed for chest pain.    rivastigmine (EXELON) 13.3 MG/24HR Place 13.3 mg onto the skin daily.   rosuvastatin (CRESTOR) 5 MG tablet Take 5 mg by mouth daily.   sertraline (ZOLOFT) 50 MG tablet Take 25 mg by mouth at bedtime.   tamsulosin (FLOMAX) 0.4 MG CAPS capsule Take 0.4 mg by mouth daily.     Allergies:   Patient has no known allergies.   Social History   Socioeconomic History   Marital status: Married    Spouse name: Not on file   Number of children: Not on file   Years of education: Not on file   Highest education level: Not on file  Occupational History   Not on file  Tobacco Use   Smoking status: Never   Smokeless tobacco: Never  Vaping Use   Vaping Use: Never used  Substance and Sexual Activity   Alcohol use: No   Drug use: No   Sexual activity: Yes    Partners: Female    Comment: married  Other Topics Concern   Not on file  Social History Narrative   Not on file   Social Determinants of Health   Financial Resource Strain: Not on file  Food Insecurity: Not on file  Transportation Needs: Not on file  Physical Activity: Not on file  Stress: Not on file  Social Connections: Not on file     Family History: The patient's family history includes Alzheimer's disease in his mother; Lung cancer in his sister.  ROS:   Please see the history of present illness.    (+) Forgetfulness  All other systems reviewed and are  negative.  EKGs/Labs/Other Studies Reviewed:    The following studies were reviewed today: CTA Neck and Head 05/05/2017 1.  No acute arterial abnormality in the head and neck.  No acture infarct. Consider brain MRI for further evaluation.  2.  Chronic microvascular ischemic disease.  3.  Intracranial atherosclerosis.  Monitor 02/11/2016 Normal sinus rhythm with no significant arrhythmia overall. Lowest heart rate was 49 bpm with no pauses greater than 2 seconds. No significant tachyarrhythmia.  ECHO 01/22/2016 - Left ventricle: The cavity size was normal. Wall thickness was    normal. Systolic function was normal. The estimated ejection    fraction was in the range of 55% to 60%. Wall motion was normal;    there were no regional wall motion abnormalities. Doppler    parameters are consistent with abnormal left ventricular    relaxation (grade 1 diastolic dysfunction).  - Aortic valve: Mildly calcified annulus. Trileaflet; mildly    calcified leaflets.  - Mitral valve: Calcified annulus. There was mild regurgitation.  - Right atrium: Central venous pressure (est): 3 mm Hg.  - Tricuspid valve: There was mild regurgitation.  - Pulmonary arteries: PA peak pressure: 26 mm Hg (S).  - Pericardium, extracardiac: There was no pericardial effusion.  Impressions:  - Normal LV wall thickness with LVEF 55-60%. Grade 1 diastolic    dysfunction with normal estimated LV filling pressure. MAC with    mild mitral regurgitation. Mildly sclerotic aortic annulus. Mild    tricuspid regurgitation with PASP 26 mmHg.   EKG:  EKG was personally reviewed 04/14/21: Sinus rhythm, rate 69 bpm 03/28/20: sinus rhythm 59, 1 AV paced beat.  Recent Labs: No results found for requested labs within last 8760 hours.  Recent Lipid Panel No results found for: CHOL, TRIG, HDL, CHOLHDL, VLDL, LDLCALC, LDLDIRECT   Physical Exam:    VS:  BP 124/70 (BP Location: Left Arm, Patient Position: Sitting, Cuff Size: Normal)    Pulse 69   Ht 5' 8.5" (1.74 m)   Wt 188 lb (85.3 kg)   SpO2 94%   BMI 28.17 kg/m     Wt Readings from Last 3 Encounters:  04/14/21 188 lb (85.3 kg)  07/12/20 194 lb 3.2 oz (88.1 kg)  03/28/20 194 lb 9.6 oz (88.3 kg)     GEN:  Well nourished, well developed in no acute distress HEENT: Normal NECK: No JVD; No carotid bruits LYMPHATICS: No lymphadenopathy CARDIAC: RRR, no murmurs, rubs, gallops, pacemaker scar  RESPIRATORY:  Clear to auscultation without rales, wheezing or rhonchi  ABDOMEN: Soft, non-tender, non-distended MUSCULOSKELETAL:  No edema; No deformity  SKIN: Warm and dry NEUROLOGIC:  Alert and oriented x 3  PSYCHIATRIC:  Normal affect   ASSESSMENT:    1. Coronary artery disease of native artery of native heart with stable angina pectoris ([place])   2. Coronary artery disease involving native coronary artery of native heart without angina pectoris   3. Benign essential HTN   4. OSA treated with BiPAP   5. Sick sinus syndrome ([place])   6. Memory impairment     PLAN:   Coronary artery disease involving native coronary artery of native heart without angina pectoris Had PTCA to circumflex artery without stent in 1995.  Doing well no anginal symptoms.  Continue with goal-directed medical therapy including aspirin statin angiotensin receptor blocker.  Benign essential HTN Well-controlled no changes made today.  On losartan 25 mg a day.  OSA treated with BiPAP Compliant with his CPAP.  Sick sinus syndrome ([place]) Pacemaker in place.  Monitored previously by Dr. Rayann Heman.  Excellent.  Memory impairment Currently on Exelon as well as Namenda.  His wife [name] thinks that the Exelon has helped stabilize things.  Excellent.  In order of problems listed above:   Medication Adjustments/Labs and Tests Ordered: Current medicines are reviewed at length with the patient today.  Concerns regarding medicines are outlined above.  Orders Placed This Encounter  Procedures   EKG  12-Lead    No orders of the defined types were placed in this encounter.   Patient Instructions  Medication Instructions:  The current medical regimen is effective;  continue present plan and medications.  *If you need a refill on your cardiac medications before your next appointment, please call your pharmacy*  Follow-Up: At [facility], you and your health needs are our priority.  As part of our continuing mission to provide you with exceptional heart care, we have created designated Provider Care Teams.  These Care Teams include your primary Cardiologist (physician) and Advanced Practice Providers (APPs -  Physician Assistants and Nurse Practitioners) who all work together to provide you with the care you need, when you need it.  We recommend signing up for the patient portal called "MyChart".  Sign up information is provided on this After Visit Summary.  MyChart is used to connect with patients for Virtual Visits (Telemedicine).  Patients are able to view lab/test results, encounter notes, upcoming appointments, etc.  Non-urgent messages can be sent to your provider as well.   To learn more about what you can do with MyChart, go to [email or web address].    Your next appointment:   1 year(s)  The format for your next appointment:   In Person  Provider:   Candee Furbish, MD     Thank you for choosing [facility]!!     Wilhemina Bonito as a scribe for Candee Furbish, MD.,have documented all relevant documentation on the behalf of Candee Furbish, MD,as directed by  Candee Furbish, MD while in the presence of Candee Furbish, MD.  I, Candee Furbish, MD, have reviewed all documentation for this visit. The documentation on 04/14/21 for the exam, diagnosis, procedures, and orders are all accurate and complete.   Signed, Candee Furbish, MD  04/14/2021 4:30 PM    [facility]

## 2021-04-14 NOTE — Assessment & Plan Note (Signed)
Compliant with his CPAP.

## 2021-04-16 ENCOUNTER — Ambulatory Visit (INDEPENDENT_AMBULATORY_CARE_PROVIDER_SITE_OTHER): Payer: Medicare PPO

## 2021-04-16 DIAGNOSIS — I495 Sick sinus syndrome: Secondary | ICD-10-CM | POA: Diagnosis not present

## 2021-04-16 LAB — CUP PACEART REMOTE DEVICE CHECK
Battery Remaining Longevity: 80 mo
Battery Remaining Percentage: 62 %
Battery Voltage: 2.99 V
Brady Statistic AP VP Percent: 1 %
Brady Statistic AP VS Percent: 2.2 %
Brady Statistic AS VP Percent: 1 %
Brady Statistic AS VS Percent: 97 %
Brady Statistic RA Percent Paced: 1.8 %
Brady Statistic RV Percent Paced: 1 %
Date Time Interrogation Session: 20221109020014
Implantable Lead Implant Date: 20171017
Implantable Lead Implant Date: 20171017
Implantable Lead Location: 753859
Implantable Lead Location: 753860
Implantable Pulse Generator Implant Date: 20171017
Lead Channel Impedance Value: 380 Ohm
Lead Channel Impedance Value: 440 Ohm
Lead Channel Pacing Threshold Amplitude: 0.5 V
Lead Channel Pacing Threshold Amplitude: 1 V
Lead Channel Pacing Threshold Pulse Width: 0.5 ms
Lead Channel Pacing Threshold Pulse Width: 0.5 ms
Lead Channel Sensing Intrinsic Amplitude: 2.7 mV
Lead Channel Sensing Intrinsic Amplitude: 7.7 mV
Lead Channel Setting Pacing Amplitude: 1.5 V
Lead Channel Setting Pacing Amplitude: 2.5 V
Lead Channel Setting Pacing Pulse Width: 0.5 ms
Lead Channel Setting Sensing Sensitivity: 2 mV
Pulse Gen Model: 2272
Pulse Gen Serial Number: 7946710

## 2021-04-24 NOTE — Progress Notes (Signed)
Remote pacemaker transmission.   

## 2021-05-09 DIAGNOSIS — Z6828 Body mass index (BMI) 28.0-28.9, adult: Secondary | ICD-10-CM | POA: Diagnosis not present

## 2021-05-09 DIAGNOSIS — M5430 Sciatica, unspecified side: Secondary | ICD-10-CM | POA: Diagnosis not present

## 2021-05-17 DIAGNOSIS — J40 Bronchitis, not specified as acute or chronic: Secondary | ICD-10-CM | POA: Diagnosis not present

## 2021-05-17 DIAGNOSIS — J329 Chronic sinusitis, unspecified: Secondary | ICD-10-CM | POA: Diagnosis not present

## 2021-06-20 DIAGNOSIS — E1169 Type 2 diabetes mellitus with other specified complication: Secondary | ICD-10-CM | POA: Diagnosis not present

## 2021-06-20 DIAGNOSIS — B029 Zoster without complications: Secondary | ICD-10-CM | POA: Diagnosis not present

## 2021-06-20 DIAGNOSIS — I10 Essential (primary) hypertension: Secondary | ICD-10-CM | POA: Diagnosis not present

## 2021-06-20 DIAGNOSIS — E1129 Type 2 diabetes mellitus with other diabetic kidney complication: Secondary | ICD-10-CM | POA: Diagnosis not present

## 2021-06-20 DIAGNOSIS — Z6828 Body mass index (BMI) 28.0-28.9, adult: Secondary | ICD-10-CM | POA: Diagnosis not present

## 2021-06-24 DIAGNOSIS — G309 Alzheimer's disease, unspecified: Secondary | ICD-10-CM | POA: Diagnosis not present

## 2021-06-24 DIAGNOSIS — N400 Enlarged prostate without lower urinary tract symptoms: Secondary | ICD-10-CM | POA: Diagnosis not present

## 2021-06-24 DIAGNOSIS — I10 Essential (primary) hypertension: Secondary | ICD-10-CM | POA: Diagnosis not present

## 2021-06-24 DIAGNOSIS — E1129 Type 2 diabetes mellitus with other diabetic kidney complication: Secondary | ICD-10-CM | POA: Diagnosis not present

## 2021-06-24 DIAGNOSIS — B029 Zoster without complications: Secondary | ICD-10-CM | POA: Diagnosis not present

## 2021-06-24 DIAGNOSIS — E7849 Other hyperlipidemia: Secondary | ICD-10-CM | POA: Diagnosis not present

## 2021-06-24 DIAGNOSIS — Z6828 Body mass index (BMI) 28.0-28.9, adult: Secondary | ICD-10-CM | POA: Diagnosis not present

## 2021-06-24 DIAGNOSIS — E1122 Type 2 diabetes mellitus with diabetic chronic kidney disease: Secondary | ICD-10-CM | POA: Diagnosis not present

## 2021-07-11 ENCOUNTER — Encounter: Payer: Self-pay | Admitting: Internal Medicine

## 2021-07-11 ENCOUNTER — Ambulatory Visit: Payer: Medicare PPO | Admitting: Internal Medicine

## 2021-07-11 ENCOUNTER — Other Ambulatory Visit: Payer: Self-pay

## 2021-07-11 VITALS — BP 144/64 | HR 76 | Ht 68.5 in | Wt 191.0 lb

## 2021-07-11 DIAGNOSIS — I25118 Atherosclerotic heart disease of native coronary artery with other forms of angina pectoris: Secondary | ICD-10-CM

## 2021-07-11 DIAGNOSIS — I495 Sick sinus syndrome: Secondary | ICD-10-CM

## 2021-07-11 DIAGNOSIS — G4733 Obstructive sleep apnea (adult) (pediatric): Secondary | ICD-10-CM | POA: Diagnosis not present

## 2021-07-11 DIAGNOSIS — I10 Essential (primary) hypertension: Secondary | ICD-10-CM | POA: Diagnosis not present

## 2021-07-11 LAB — CUP PACEART INCLINIC DEVICE CHECK
Battery Remaining Longevity: 85 mo
Battery Voltage: 2.99 V
Brady Statistic RA Percent Paced: 1.6 %
Brady Statistic RV Percent Paced: 0.57 %
Date Time Interrogation Session: 20230203111539
Implantable Lead Implant Date: 20171017
Implantable Lead Implant Date: 20171017
Implantable Lead Location: 753859
Implantable Lead Location: 753860
Implantable Pulse Generator Implant Date: 20171017
Lead Channel Impedance Value: 400 Ohm
Lead Channel Impedance Value: 462.5 Ohm
Lead Channel Pacing Threshold Amplitude: 0.5 V
Lead Channel Pacing Threshold Amplitude: 0.75 V
Lead Channel Pacing Threshold Amplitude: 0.75 V
Lead Channel Pacing Threshold Pulse Width: 0.5 ms
Lead Channel Pacing Threshold Pulse Width: 0.5 ms
Lead Channel Pacing Threshold Pulse Width: 0.5 ms
Lead Channel Sensing Intrinsic Amplitude: 4.3 mV
Lead Channel Sensing Intrinsic Amplitude: 7.5 mV
Lead Channel Setting Pacing Amplitude: 1.5 V
Lead Channel Setting Pacing Amplitude: 2.5 V
Lead Channel Setting Pacing Pulse Width: 0.5 ms
Lead Channel Setting Sensing Sensitivity: 2 mV
Pulse Gen Model: 2272
Pulse Gen Serial Number: 7946710

## 2021-07-11 NOTE — Progress Notes (Signed)
PCP: Manon Hilding, MD Primary Cardiologist: Dr Marlou Porch Primary EP:  Dr Rayann Heman  Joel Nelson is a 88 y.o. male who presents today for routine electrophysiology followup.  Since last being seen in our clinic, the patient reports doing very well.  Today, he denies symptoms of palpitations, chest pain, shortness of breath,  lower extremity edema, dizziness, presyncope, or syncope.  The patient is otherwise without complaint today.   Past Medical History:  Diagnosis Date   Coronary artery disease 1995    remote left circumflex angioplasty without stent placement in 1995   Excessive daytime sleepiness 11/22/2017   History of kidney stones    History of shingles    Hyperlipidemia    Hypertension    Melanoma ([place])    Memory loss    OSA treated with BiPAP    moderate obstructive sleep apnea with an AHI of 18.7/h and mild central sleep apnea with his CAI of 5.4/h.On BiPAP at 9/5cm H2O   Pre-diabetes    Presence of permanent cardiac pacemaker 03/24/2016   Sick sinus syndrome ([place]) 03/24/2016   Syncope 03/24/2016   Past Surgical History:  Procedure Laterality Date   EP IMPLANTABLE DEVICE N/A 03/24/2016   SJM Assurity MRI PPM implanted by Dr Rayann Heman for sick sinus syndrome   herniated disk repair      ROS- all systems are reviewed and negative except as per HPI above  Current Outpatient Medications  Medication Sig Dispense Refill   aspirin EC 81 MG tablet Take 81 mg by mouth in the morning and at bedtime.     Cyanocobalamin 1000 MCG TBCR Take 1 tablet by mouth daily.     ezetimibe (ZETIA) 10 MG tablet Take 10 mg by mouth daily.     fluticasone (FLONASE) 50 MCG/ACT nasal spray Place 1 spray into both nostrils daily as needed for allergies.     losartan (COZAAR) 25 MG tablet Take 25 mg by mouth daily.     memantine (NAMENDA) 10 MG tablet Take 10 mg by mouth daily.     Multiple Vitamin (MULTI-VITAMINS) TABS Take 1 tablet by mouth daily.     nitroGLYCERIN (NITROSTAT) 0.4 MG SL tablet  Place 0.4 mg under the tongue every 5 (five) minutes as needed for chest pain.      rivastigmine (EXELON) 13.3 MG/24HR Place 13.3 mg onto the skin daily.     rosuvastatin (CRESTOR) 5 MG tablet Take 5 mg by mouth daily.     sertraline (ZOLOFT) 50 MG tablet Take 25 mg by mouth at bedtime.     tamsulosin (FLOMAX) 0.4 MG CAPS capsule Take 0.4 mg by mouth daily.     No current facility-administered medications for this visit.    Physical Exam: Vitals:   07/11/21 1109  BP: (!) 144/64  Pulse: 76  SpO2: 94%  Weight: 191 lb (86.6 kg)  Height: 5' 8.5" (1.74 m)    GEN- The patient is well appearing, alert and oriented x 3 today.   Head- normocephalic, atraumatic Eyes-  Sclera clear, conjunctiva pink Ears- hearing intact Oropharynx- clear Lungs- Clear to ausculation bilaterally, normal work of breathing Chest- pacemaker pocket is well healed Heart- Regular rate and rhythm, no murmurs, rubs or gallops, PMI not laterally displaced GI- soft, NT, ND, + BS Extremities- no clubbing, cyanosis, or edema  Pacemaker interrogation- reviewed in detail today,  See PACEART report    Assessment and Plan:  1. Symptomatic sinus bradycardia  Normal pacemaker function See Pace Art report No changes  today he is not device dependant today  2. HTN Stable No change required today  3. OSA Follows with Dr Radford Pax  4. CAD s/p PTCA of LCx in 1995 No ischemic symptoms  Return in a year  Thompson Grayer MD, [facility] 07/11/2021 11:13 AM

## 2021-07-11 NOTE — Patient Instructions (Signed)
Medication Instructions:  Continue all current medications.  Labwork: none  Testing/Procedures: none  Follow-Up: 1 year - Dr.  Allred   Any Other Special Instructions Will Be Listed Below (If Applicable).   If you need a refill on your cardiac medications before your next appointment, please call your pharmacy.  

## 2021-07-16 ENCOUNTER — Ambulatory Visit (INDEPENDENT_AMBULATORY_CARE_PROVIDER_SITE_OTHER): Payer: Medicare PPO

## 2021-07-16 DIAGNOSIS — I495 Sick sinus syndrome: Secondary | ICD-10-CM | POA: Diagnosis not present

## 2021-07-16 LAB — CUP PACEART REMOTE DEVICE CHECK
Battery Remaining Longevity: 77 mo
Battery Remaining Percentage: 60 %
Battery Voltage: 2.99 V
Brady Statistic AP VP Percent: 1 %
Brady Statistic AP VS Percent: 1 %
Brady Statistic AS VP Percent: 1 %
Brady Statistic AS VS Percent: 99 %
Brady Statistic RA Percent Paced: 1 %
Brady Statistic RV Percent Paced: 1 %
Date Time Interrogation Session: 20230208032936
Implantable Lead Implant Date: 20171017
Implantable Lead Implant Date: 20171017
Implantable Lead Location: 753859
Implantable Lead Location: 753860
Implantable Pulse Generator Implant Date: 20171017
Lead Channel Impedance Value: 380 Ohm
Lead Channel Impedance Value: 450 Ohm
Lead Channel Pacing Threshold Amplitude: 0.5 V
Lead Channel Pacing Threshold Amplitude: 0.75 V
Lead Channel Pacing Threshold Pulse Width: 0.5 ms
Lead Channel Pacing Threshold Pulse Width: 0.5 ms
Lead Channel Sensing Intrinsic Amplitude: 3 mV
Lead Channel Sensing Intrinsic Amplitude: 7.3 mV
Lead Channel Setting Pacing Amplitude: 1.5 V
Lead Channel Setting Pacing Amplitude: 2.5 V
Lead Channel Setting Pacing Pulse Width: 0.5 ms
Lead Channel Setting Sensing Sensitivity: 2 mV
Pulse Gen Model: 2272
Pulse Gen Serial Number: 7946710

## 2021-07-21 NOTE — Progress Notes (Signed)
Remote pacemaker transmission.   

## 2021-08-25 DIAGNOSIS — R059 Cough, unspecified: Secondary | ICD-10-CM | POA: Diagnosis not present

## 2021-08-25 DIAGNOSIS — Z6828 Body mass index (BMI) 28.0-28.9, adult: Secondary | ICD-10-CM | POA: Diagnosis not present

## 2021-08-25 DIAGNOSIS — R5383 Other fatigue: Secondary | ICD-10-CM | POA: Diagnosis not present

## 2021-08-25 DIAGNOSIS — Z20828 Contact with and (suspected) exposure to other viral communicable diseases: Secondary | ICD-10-CM | POA: Diagnosis not present

## 2021-09-23 DIAGNOSIS — Z79811 Long term (current) use of aromatase inhibitors: Secondary | ICD-10-CM | POA: Diagnosis not present

## 2021-09-23 DIAGNOSIS — G4733 Obstructive sleep apnea (adult) (pediatric): Secondary | ICD-10-CM | POA: Diagnosis not present

## 2021-09-23 DIAGNOSIS — F03A0 Unspecified dementia, mild, without behavioral disturbance, psychotic disturbance, mood disturbance, and anxiety: Secondary | ICD-10-CM | POA: Diagnosis not present

## 2021-09-23 DIAGNOSIS — F03A3 Unspecified dementia, mild, with mood disturbance: Secondary | ICD-10-CM | POA: Diagnosis not present

## 2021-09-23 DIAGNOSIS — Z820 Family history of epilepsy and other diseases of the nervous system: Secondary | ICD-10-CM | POA: Diagnosis not present

## 2021-09-23 DIAGNOSIS — Z8673 Personal history of transient ischemic attack (TIA), and cerebral infarction without residual deficits: Secondary | ICD-10-CM | POA: Diagnosis not present

## 2021-09-23 DIAGNOSIS — Z79899 Other long term (current) drug therapy: Secondary | ICD-10-CM | POA: Diagnosis not present

## 2021-09-24 DIAGNOSIS — L570 Actinic keratosis: Secondary | ICD-10-CM | POA: Diagnosis not present

## 2021-09-24 DIAGNOSIS — Z1283 Encounter for screening for malignant neoplasm of skin: Secondary | ICD-10-CM | POA: Diagnosis not present

## 2021-09-24 DIAGNOSIS — Z85828 Personal history of other malignant neoplasm of skin: Secondary | ICD-10-CM | POA: Diagnosis not present

## 2021-09-26 DIAGNOSIS — H6123 Impacted cerumen, bilateral: Secondary | ICD-10-CM | POA: Diagnosis not present

## 2021-09-26 DIAGNOSIS — H903 Sensorineural hearing loss, bilateral: Secondary | ICD-10-CM | POA: Diagnosis not present

## 2021-09-29 DIAGNOSIS — H906 Mixed conductive and sensorineural hearing loss, bilateral: Secondary | ICD-10-CM | POA: Diagnosis not present

## 2021-10-15 ENCOUNTER — Ambulatory Visit (INDEPENDENT_AMBULATORY_CARE_PROVIDER_SITE_OTHER): Payer: Medicare PPO

## 2021-10-15 DIAGNOSIS — I495 Sick sinus syndrome: Secondary | ICD-10-CM | POA: Diagnosis not present

## 2021-10-15 LAB — CUP PACEART REMOTE DEVICE CHECK
Battery Remaining Longevity: 75 mo
Battery Remaining Percentage: 58 %
Battery Voltage: 2.99 V
Brady Statistic AP VP Percent: 1 %
Brady Statistic AP VS Percent: 1 %
Brady Statistic AS VP Percent: 1 %
Brady Statistic AS VS Percent: 99 %
Brady Statistic RA Percent Paced: 1 %
Brady Statistic RV Percent Paced: 1 %
Date Time Interrogation Session: 20230510020012
Implantable Lead Implant Date: 20171017
Implantable Lead Implant Date: 20171017
Implantable Lead Location: 753859
Implantable Lead Location: 753860
Implantable Pulse Generator Implant Date: 20171017
Lead Channel Impedance Value: 390 Ohm
Lead Channel Impedance Value: 460 Ohm
Lead Channel Pacing Threshold Amplitude: 0.5 V
Lead Channel Pacing Threshold Amplitude: 0.75 V
Lead Channel Pacing Threshold Pulse Width: 0.5 ms
Lead Channel Pacing Threshold Pulse Width: 0.5 ms
Lead Channel Sensing Intrinsic Amplitude: 2.7 mV
Lead Channel Sensing Intrinsic Amplitude: 8.6 mV
Lead Channel Setting Pacing Amplitude: 1.5 V
Lead Channel Setting Pacing Amplitude: 2.5 V
Lead Channel Setting Pacing Pulse Width: 0.5 ms
Lead Channel Setting Sensing Sensitivity: 2 mV
Pulse Gen Model: 2272
Pulse Gen Serial Number: 7946710

## 2021-10-16 DIAGNOSIS — I10 Essential (primary) hypertension: Secondary | ICD-10-CM | POA: Diagnosis not present

## 2021-10-16 DIAGNOSIS — E782 Mixed hyperlipidemia: Secondary | ICD-10-CM | POA: Diagnosis not present

## 2021-10-16 DIAGNOSIS — R7301 Impaired fasting glucose: Secondary | ICD-10-CM | POA: Diagnosis not present

## 2021-10-16 DIAGNOSIS — E1122 Type 2 diabetes mellitus with diabetic chronic kidney disease: Secondary | ICD-10-CM | POA: Diagnosis not present

## 2021-10-16 DIAGNOSIS — E7801 Familial hypercholesterolemia: Secondary | ICD-10-CM | POA: Diagnosis not present

## 2021-10-16 DIAGNOSIS — E1169 Type 2 diabetes mellitus with other specified complication: Secondary | ICD-10-CM | POA: Diagnosis not present

## 2021-10-21 DIAGNOSIS — I10 Essential (primary) hypertension: Secondary | ICD-10-CM | POA: Diagnosis not present

## 2021-10-21 DIAGNOSIS — E870 Hyperosmolality and hypernatremia: Secondary | ICD-10-CM | POA: Diagnosis not present

## 2021-10-21 DIAGNOSIS — N400 Enlarged prostate without lower urinary tract symptoms: Secondary | ICD-10-CM | POA: Diagnosis not present

## 2021-10-21 DIAGNOSIS — G309 Alzheimer's disease, unspecified: Secondary | ICD-10-CM | POA: Diagnosis not present

## 2021-10-21 DIAGNOSIS — E1129 Type 2 diabetes mellitus with other diabetic kidney complication: Secondary | ICD-10-CM | POA: Diagnosis not present

## 2021-10-21 DIAGNOSIS — B029 Zoster without complications: Secondary | ICD-10-CM | POA: Diagnosis not present

## 2021-10-21 DIAGNOSIS — E1122 Type 2 diabetes mellitus with diabetic chronic kidney disease: Secondary | ICD-10-CM | POA: Diagnosis not present

## 2021-10-21 DIAGNOSIS — Z0001 Encounter for general adult medical examination with abnormal findings: Secondary | ICD-10-CM | POA: Diagnosis not present

## 2021-10-23 NOTE — Progress Notes (Signed)
Remote pacemaker transmission.   

## 2021-11-15 DIAGNOSIS — K2100 Gastro-esophageal reflux disease with esophagitis, without bleeding: Secondary | ICD-10-CM | POA: Diagnosis not present

## 2021-11-15 DIAGNOSIS — Z6828 Body mass index (BMI) 28.0-28.9, adult: Secondary | ICD-10-CM | POA: Diagnosis not present

## 2022-01-14 ENCOUNTER — Ambulatory Visit (INDEPENDENT_AMBULATORY_CARE_PROVIDER_SITE_OTHER): Payer: Medicare PPO

## 2022-01-14 DIAGNOSIS — I495 Sick sinus syndrome: Secondary | ICD-10-CM | POA: Diagnosis not present

## 2022-01-14 LAB — CUP PACEART REMOTE DEVICE CHECK
Battery Remaining Longevity: 73 mo
Battery Remaining Percentage: 56 %
Battery Voltage: 2.99 V
Brady Statistic AP VP Percent: 1 %
Brady Statistic AP VS Percent: 1.1 %
Brady Statistic AS VP Percent: 1 %
Brady Statistic AS VS Percent: 99 %
Brady Statistic RA Percent Paced: 1 %
Brady Statistic RV Percent Paced: 1 %
Date Time Interrogation Session: 20230809020014
Implantable Lead Implant Date: 20171017
Implantable Lead Implant Date: 20171017
Implantable Lead Location: 753859
Implantable Lead Location: 753860
Implantable Pulse Generator Implant Date: 20171017
Lead Channel Impedance Value: 400 Ohm
Lead Channel Impedance Value: 480 Ohm
Lead Channel Pacing Threshold Amplitude: 0.5 V
Lead Channel Pacing Threshold Amplitude: 0.75 V
Lead Channel Pacing Threshold Pulse Width: 0.5 ms
Lead Channel Pacing Threshold Pulse Width: 0.5 ms
Lead Channel Sensing Intrinsic Amplitude: 3 mV
Lead Channel Sensing Intrinsic Amplitude: 7.8 mV
Lead Channel Setting Pacing Amplitude: 1.5 V
Lead Channel Setting Pacing Amplitude: 2.5 V
Lead Channel Setting Pacing Pulse Width: 0.5 ms
Lead Channel Setting Sensing Sensitivity: 2 mV
Pulse Gen Model: 2272
Pulse Gen Serial Number: 7946710

## 2022-01-22 DIAGNOSIS — H43813 Vitreous degeneration, bilateral: Secondary | ICD-10-CM | POA: Diagnosis not present

## 2022-02-03 DIAGNOSIS — E7800 Pure hypercholesterolemia, unspecified: Secondary | ICD-10-CM | POA: Diagnosis not present

## 2022-02-03 DIAGNOSIS — E7801 Familial hypercholesterolemia: Secondary | ICD-10-CM | POA: Diagnosis not present

## 2022-02-03 DIAGNOSIS — I10 Essential (primary) hypertension: Secondary | ICD-10-CM | POA: Diagnosis not present

## 2022-02-03 DIAGNOSIS — E782 Mixed hyperlipidemia: Secondary | ICD-10-CM | POA: Diagnosis not present

## 2022-02-03 DIAGNOSIS — E119 Type 2 diabetes mellitus without complications: Secondary | ICD-10-CM | POA: Diagnosis not present

## 2022-02-03 DIAGNOSIS — E7849 Other hyperlipidemia: Secondary | ICD-10-CM | POA: Diagnosis not present

## 2022-02-03 DIAGNOSIS — E8881 Metabolic syndrome: Secondary | ICD-10-CM | POA: Diagnosis not present

## 2022-02-17 NOTE — Progress Notes (Signed)
Remote pacemaker transmission.   

## 2022-02-23 DIAGNOSIS — F0280 Dementia in other diseases classified elsewhere without behavioral disturbance: Secondary | ICD-10-CM | POA: Diagnosis not present

## 2022-02-23 DIAGNOSIS — R7301 Impaired fasting glucose: Secondary | ICD-10-CM | POA: Diagnosis not present

## 2022-02-23 DIAGNOSIS — R809 Proteinuria, unspecified: Secondary | ICD-10-CM | POA: Diagnosis not present

## 2022-02-23 DIAGNOSIS — E1129 Type 2 diabetes mellitus with other diabetic kidney complication: Secondary | ICD-10-CM | POA: Diagnosis not present

## 2022-02-23 DIAGNOSIS — I10 Essential (primary) hypertension: Secondary | ICD-10-CM | POA: Diagnosis not present

## 2022-02-23 DIAGNOSIS — E1122 Type 2 diabetes mellitus with diabetic chronic kidney disease: Secondary | ICD-10-CM | POA: Diagnosis not present

## 2022-02-23 DIAGNOSIS — E7849 Other hyperlipidemia: Secondary | ICD-10-CM | POA: Diagnosis not present

## 2022-02-23 DIAGNOSIS — G309 Alzheimer's disease, unspecified: Secondary | ICD-10-CM | POA: Diagnosis not present

## 2022-02-23 DIAGNOSIS — N400 Enlarged prostate without lower urinary tract symptoms: Secondary | ICD-10-CM | POA: Diagnosis not present

## 2022-03-16 DIAGNOSIS — L989 Disorder of the skin and subcutaneous tissue, unspecified: Secondary | ICD-10-CM | POA: Diagnosis not present

## 2022-03-16 DIAGNOSIS — Z6829 Body mass index (BMI) 29.0-29.9, adult: Secondary | ICD-10-CM | POA: Diagnosis not present

## 2022-03-16 DIAGNOSIS — J309 Allergic rhinitis, unspecified: Secondary | ICD-10-CM | POA: Diagnosis not present

## 2022-03-30 DIAGNOSIS — D485 Neoplasm of uncertain behavior of skin: Secondary | ICD-10-CM | POA: Diagnosis not present

## 2022-03-30 DIAGNOSIS — L570 Actinic keratosis: Secondary | ICD-10-CM | POA: Diagnosis not present

## 2022-04-08 DIAGNOSIS — H02132 Senile ectropion of right lower eyelid: Secondary | ICD-10-CM | POA: Diagnosis not present

## 2022-04-15 ENCOUNTER — Ambulatory Visit (INDEPENDENT_AMBULATORY_CARE_PROVIDER_SITE_OTHER): Payer: Medicare PPO

## 2022-04-15 DIAGNOSIS — I495 Sick sinus syndrome: Secondary | ICD-10-CM

## 2022-04-15 LAB — CUP PACEART REMOTE DEVICE CHECK
Battery Remaining Longevity: 70 mo
Battery Remaining Percentage: 54 %
Battery Voltage: 2.99 V
Brady Statistic AP VP Percent: 1 %
Brady Statistic AP VS Percent: 1.3 %
Brady Statistic AS VP Percent: 1 %
Brady Statistic AS VS Percent: 98 %
Brady Statistic RA Percent Paced: 1.1 %
Brady Statistic RV Percent Paced: 1 %
Date Time Interrogation Session: 20231108020025
Implantable Lead Connection Status: 753985
Implantable Lead Connection Status: 753985
Implantable Lead Implant Date: 20171017
Implantable Lead Implant Date: 20171017
Implantable Lead Location: 753859
Implantable Lead Location: 753860
Implantable Pulse Generator Implant Date: 20171017
Lead Channel Impedance Value: 390 Ohm
Lead Channel Impedance Value: 460 Ohm
Lead Channel Pacing Threshold Amplitude: 0.5 V
Lead Channel Pacing Threshold Amplitude: 0.75 V
Lead Channel Pacing Threshold Pulse Width: 0.5 ms
Lead Channel Pacing Threshold Pulse Width: 0.5 ms
Lead Channel Sensing Intrinsic Amplitude: 2.6 mV
Lead Channel Sensing Intrinsic Amplitude: 7.3 mV
Lead Channel Setting Pacing Amplitude: 1.5 V
Lead Channel Setting Pacing Amplitude: 2.5 V
Lead Channel Setting Pacing Pulse Width: 0.5 ms
Lead Channel Setting Sensing Sensitivity: 2 mV
Pulse Gen Model: 2272
Pulse Gen Serial Number: 7946710

## 2022-04-28 NOTE — Progress Notes (Signed)
Remote pacemaker transmission.   

## 2022-05-18 DIAGNOSIS — R059 Cough, unspecified: Secondary | ICD-10-CM | POA: Diagnosis not present

## 2022-05-18 DIAGNOSIS — Z20822 Contact with and (suspected) exposure to covid-19: Secondary | ICD-10-CM | POA: Diagnosis not present

## 2022-05-18 DIAGNOSIS — U071 COVID-19: Secondary | ICD-10-CM | POA: Diagnosis not present

## 2022-05-26 DIAGNOSIS — Z6828 Body mass index (BMI) 28.0-28.9, adult: Secondary | ICD-10-CM | POA: Diagnosis not present

## 2022-05-26 DIAGNOSIS — R6883 Chills (without fever): Secondary | ICD-10-CM | POA: Diagnosis not present

## 2022-06-26 DIAGNOSIS — G309 Alzheimer's disease, unspecified: Secondary | ICD-10-CM | POA: Diagnosis not present

## 2022-06-26 DIAGNOSIS — E1169 Type 2 diabetes mellitus with other specified complication: Secondary | ICD-10-CM | POA: Diagnosis not present

## 2022-06-26 DIAGNOSIS — E7849 Other hyperlipidemia: Secondary | ICD-10-CM | POA: Diagnosis not present

## 2022-06-26 DIAGNOSIS — Z950 Presence of cardiac pacemaker: Secondary | ICD-10-CM | POA: Diagnosis not present

## 2022-06-26 DIAGNOSIS — E7801 Familial hypercholesterolemia: Secondary | ICD-10-CM | POA: Diagnosis not present

## 2022-06-26 DIAGNOSIS — I10 Essential (primary) hypertension: Secondary | ICD-10-CM | POA: Diagnosis not present

## 2022-06-26 DIAGNOSIS — E1122 Type 2 diabetes mellitus with diabetic chronic kidney disease: Secondary | ICD-10-CM | POA: Diagnosis not present

## 2022-07-03 DIAGNOSIS — E1122 Type 2 diabetes mellitus with diabetic chronic kidney disease: Secondary | ICD-10-CM | POA: Diagnosis not present

## 2022-07-03 DIAGNOSIS — R809 Proteinuria, unspecified: Secondary | ICD-10-CM | POA: Diagnosis not present

## 2022-07-03 DIAGNOSIS — F0280 Dementia in other diseases classified elsewhere without behavioral disturbance: Secondary | ICD-10-CM | POA: Diagnosis not present

## 2022-07-03 DIAGNOSIS — N400 Enlarged prostate without lower urinary tract symptoms: Secondary | ICD-10-CM | POA: Diagnosis not present

## 2022-07-03 DIAGNOSIS — E7849 Other hyperlipidemia: Secondary | ICD-10-CM | POA: Diagnosis not present

## 2022-07-03 DIAGNOSIS — R7301 Impaired fasting glucose: Secondary | ICD-10-CM | POA: Diagnosis not present

## 2022-07-03 DIAGNOSIS — I10 Essential (primary) hypertension: Secondary | ICD-10-CM | POA: Diagnosis not present

## 2022-07-03 DIAGNOSIS — E1129 Type 2 diabetes mellitus with other diabetic kidney complication: Secondary | ICD-10-CM | POA: Diagnosis not present

## 2022-07-03 DIAGNOSIS — G309 Alzheimer's disease, unspecified: Secondary | ICD-10-CM | POA: Diagnosis not present

## 2022-07-10 ENCOUNTER — Encounter: Payer: Medicare PPO | Admitting: Cardiovascular Disease

## 2022-07-13 DIAGNOSIS — H90A31 Mixed conductive and sensorineural hearing loss, unilateral, right ear with restricted hearing on the contralateral side: Secondary | ICD-10-CM | POA: Diagnosis not present

## 2022-07-13 DIAGNOSIS — H663X2 Other chronic suppurative otitis media, left ear: Secondary | ICD-10-CM | POA: Diagnosis not present

## 2022-07-15 ENCOUNTER — Ambulatory Visit (INDEPENDENT_AMBULATORY_CARE_PROVIDER_SITE_OTHER): Payer: Medicare PPO

## 2022-07-15 DIAGNOSIS — I495 Sick sinus syndrome: Secondary | ICD-10-CM

## 2022-07-15 LAB — CUP PACEART REMOTE DEVICE CHECK
Battery Remaining Longevity: 67 mo
Battery Remaining Percentage: 52 %
Battery Voltage: 2.99 V
Brady Statistic AP VP Percent: 1 %
Brady Statistic AP VS Percent: 1 %
Brady Statistic AS VP Percent: 1 %
Brady Statistic AS VS Percent: 99 %
Brady Statistic RA Percent Paced: 1 %
Brady Statistic RV Percent Paced: 1 %
Date Time Interrogation Session: 20240207020013
Implantable Lead Connection Status: 753985
Implantable Lead Connection Status: 753985
Implantable Lead Implant Date: 20171017
Implantable Lead Implant Date: 20171017
Implantable Lead Location: 753859
Implantable Lead Location: 753860
Implantable Pulse Generator Implant Date: 20171017
Lead Channel Impedance Value: 390 Ohm
Lead Channel Impedance Value: 440 Ohm
Lead Channel Pacing Threshold Amplitude: 0.5 V
Lead Channel Pacing Threshold Amplitude: 0.75 V
Lead Channel Pacing Threshold Pulse Width: 0.5 ms
Lead Channel Pacing Threshold Pulse Width: 0.5 ms
Lead Channel Sensing Intrinsic Amplitude: 2.8 mV
Lead Channel Sensing Intrinsic Amplitude: 7 mV
Lead Channel Setting Pacing Amplitude: 1.5 V
Lead Channel Setting Pacing Amplitude: 2.5 V
Lead Channel Setting Pacing Pulse Width: 0.5 ms
Lead Channel Setting Sensing Sensitivity: 2 mV
Pulse Gen Model: 2272
Pulse Gen Serial Number: 7946710

## 2022-07-23 ENCOUNTER — Encounter: Payer: Self-pay | Admitting: Cardiovascular Disease

## 2022-07-23 ENCOUNTER — Ambulatory Visit: Payer: Medicare PPO | Attending: Internal Medicine | Admitting: Cardiovascular Disease

## 2022-07-23 VITALS — BP 146/62 | HR 63 | Ht 68.5 in | Wt 193.8 lb

## 2022-07-23 DIAGNOSIS — I495 Sick sinus syndrome: Secondary | ICD-10-CM

## 2022-07-23 LAB — CUP PACEART INCLINIC DEVICE CHECK
Battery Remaining Longevity: 74 mo
Battery Voltage: 2.99 V
Brady Statistic RA Percent Paced: 0.82 %
Brady Statistic RV Percent Paced: 0.12 %
Date Time Interrogation Session: 20240215103753
Implantable Lead Connection Status: 753985
Implantable Lead Connection Status: 753985
Implantable Lead Implant Date: 20171017
Implantable Lead Implant Date: 20171017
Implantable Lead Location: 753859
Implantable Lead Location: 753860
Implantable Pulse Generator Implant Date: 20171017
Lead Channel Impedance Value: 400 Ohm
Lead Channel Impedance Value: 450 Ohm
Lead Channel Pacing Threshold Amplitude: 0.5 V
Lead Channel Pacing Threshold Amplitude: 0.5 V
Lead Channel Pacing Threshold Amplitude: 0.75 V
Lead Channel Pacing Threshold Amplitude: 0.75 V
Lead Channel Pacing Threshold Pulse Width: 0.5 ms
Lead Channel Pacing Threshold Pulse Width: 0.5 ms
Lead Channel Pacing Threshold Pulse Width: 0.5 ms
Lead Channel Pacing Threshold Pulse Width: 0.5 ms
Lead Channel Sensing Intrinsic Amplitude: 2.7 mV
Lead Channel Sensing Intrinsic Amplitude: 7.7 mV
Lead Channel Setting Pacing Amplitude: 1.5 V
Lead Channel Setting Pacing Amplitude: 2.5 V
Lead Channel Setting Pacing Pulse Width: 0.5 ms
Lead Channel Setting Sensing Sensitivity: 2 mV
Pulse Gen Model: 2272
Pulse Gen Serial Number: 7946710

## 2022-07-23 NOTE — Progress Notes (Signed)
PCP: Manon Hilding, MD Primary Cardiologist: Dr Marlou Porch Primary EP:  Dr Rayann Heman  Joel Nelson is a 89 y.o. male who presents today for routine electrophysiology followup.  Since last being seen in our clinic, the patient reports doing very well.  Today, he denies symptoms of palpitations, chest pain, shortness of breath,  lower extremity edema, dizziness, presyncope, or syncope.  The patient is otherwise without complaint today.   Past Medical History:  Diagnosis Date   Coronary artery disease 1995    remote left circumflex angioplasty without stent placement in 1995   Excessive daytime sleepiness 11/22/2017   History of kidney stones    History of shingles    Hyperlipidemia    Hypertension    Melanoma ([place])    Memory loss    OSA treated with BiPAP    moderate obstructive sleep apnea with an AHI of 18.7/h and mild central sleep apnea with his CAI of 5.4/h.On BiPAP at 9/5cm H2O   Pre-diabetes    Presence of permanent cardiac pacemaker 03/24/2016   Sick sinus syndrome ([place]) 03/24/2016   Syncope 03/24/2016   Past Surgical History:  Procedure Laterality Date   EP IMPLANTABLE DEVICE N/A 03/24/2016   SJM Assurity MRI PPM implanted by Dr Rayann Heman for sick sinus syndrome   herniated disk repair      ROS- all systems are reviewed and negative except as per HPI above  Current Outpatient Medications  Medication Sig Dispense Refill   aspirin EC 81 MG tablet Take 81 mg by mouth in the morning and at bedtime.     Cyanocobalamin 1000 MCG TBCR Take 1 tablet by mouth daily.     ezetimibe (ZETIA) 10 MG tablet Take 10 mg by mouth daily.     fluticasone (FLONASE) 50 MCG/ACT nasal spray Place 1 spray into both nostrils daily as needed for allergies.     losartan (COZAAR) 25 MG tablet Take 25 mg by mouth daily.     memantine (NAMENDA) 10 MG tablet Take 10 mg by mouth daily.     Multiple Vitamin (MULTI-VITAMINS) TABS Take 1 tablet by mouth daily.     nitroGLYCERIN (NITROSTAT) 0.4 MG SL tablet  Place 0.4 mg under the tongue every 5 (five) minutes as needed for chest pain.      rivastigmine (EXELON) 13.3 MG/24HR Place 13.3 mg onto the skin daily.     rosuvastatin (CRESTOR) 5 MG tablet Take 5 mg by mouth daily.     sertraline (ZOLOFT) 50 MG tablet Take 25 mg by mouth at bedtime.     tamsulosin (FLOMAX) 0.4 MG CAPS capsule Take 0.4 mg by mouth daily.     No current facility-administered medications for this visit.    Physical Exam: Vitals:   07/23/22 0939  BP: (!) 146/62  Pulse: 63  SpO2: 96%  Weight: 193 lb 12.8 oz (87.9 kg)  Height: 5' 8.5" (1.74 m)    Gen: Appears comfortable, well-nourished CV: RRR, no dependent edema The device site is normal -- no tenderness, edema, drainage, redness, threatened erosion. Pulm: breathing easily   Pacemaker interrogation- reviewed in detail today,  See PACEART report   ECG today shows normal rhythm  Assessment and Plan:  1. Symptomatic sinus bradycardia  Normal pacemaker function See Pace Art report No changes today he is not device dependant today  2. HTN Stable No change required today  3. OSA Follows with Dr Radford Pax  4. CAD s/p PTCA of LCx in 1995 No ischemic symptoms  Return  in a year  Melida Quitter, MD 07/23/2022 9:58 AM

## 2022-07-23 NOTE — Patient Instructions (Signed)
Medication Instructions:  Continue all current medications.  Labwork: none  Testing/Procedures: none  Follow-Up: 1 year - Dr.  Mealor    Any Other Special Instructions Will Be Listed Below (If Applicable).   If you need a refill on your cardiac medications before your next appointment, please call your pharmacy.  

## 2022-08-03 ENCOUNTER — Encounter: Payer: Self-pay | Admitting: Cardiology

## 2022-08-03 ENCOUNTER — Ambulatory Visit: Payer: Medicare PPO | Attending: Cardiology | Admitting: Cardiology

## 2022-08-03 VITALS — BP 122/60 | HR 63 | Ht 68.5 in | Wt 193.0 lb

## 2022-08-03 DIAGNOSIS — G4733 Obstructive sleep apnea (adult) (pediatric): Secondary | ICD-10-CM

## 2022-08-03 DIAGNOSIS — I10 Essential (primary) hypertension: Secondary | ICD-10-CM | POA: Diagnosis not present

## 2022-08-03 DIAGNOSIS — I495 Sick sinus syndrome: Secondary | ICD-10-CM

## 2022-08-03 DIAGNOSIS — I25118 Atherosclerotic heart disease of native coronary artery with other forms of angina pectoris: Secondary | ICD-10-CM

## 2022-08-03 NOTE — Progress Notes (Signed)
Cardiology Office Note:    Date:  08/03/2022   ID:  Joel Nelson, DOB July 11, 1933, MRN [id number]  PCP:  Joel Hilding, MD  [facility] Cardiologist:  Joel Furbish, MD  [facility] Electrophysiologist:  Joel Grayer, MD (Inactive)   Referring MD: Joel Hilding, MD   History of Present Illness:    Joel Nelson is a 89 y.o. male with hx of coronary artery disease prior left circumflex artery PTCA only in 1995 here for follow-up.  Has hypertension hyperlipidemia and sleep apnea followed by Dr. Radford Nelson.  Has pacemaker previously followed by Dr. Rayann Nelson, now Dr. Myles Nelson.  This was implanted in October 2017.  She was very thankful for Dr. Rayann Nelson.  Had significant pauses in the hospital.  Today, he is accompanied by a his wife, Joel Nelson. Overall, he has been doing okay. He is tolerating his medicine and his cholesterol is well-controlled on both low-dose Crestor as well as Zetia. His wife does report memory issues that have been worsening. However, he is still able to drive with her in the car to navigate.  Denies any fevers chills nausea vomiting syncope bleeding.  Past Medical History:  Diagnosis Date   Coronary artery disease 1995    remote left circumflex angioplasty without stent placement in 1995   Excessive daytime sleepiness 11/22/2017   History of kidney stones    History of shingles    Hyperlipidemia    Hypertension    Melanoma ([place])    Memory loss    OSA treated with BiPAP    moderate obstructive sleep apnea with an AHI of 18.7/h and mild central sleep apnea with his CAI of 5.4/h.On BiPAP at 9/5cm H2O   Pre-diabetes    Presence of permanent cardiac pacemaker 03/24/2016   Sick sinus syndrome ([place]) 03/24/2016   Syncope 03/24/2016    Past Surgical History:  Procedure Laterality Date   EP IMPLANTABLE DEVICE N/A 03/24/2016   SJM Assurity MRI PPM implanted by Dr Joel Nelson for sick sinus syndrome   herniated disk repair      Current Medications: Current Meds  Medication  Sig   aspirin EC 81 MG tablet Take 81 mg by mouth in the morning and at bedtime.   Cyanocobalamin 1000 MCG TBCR Take 1 tablet by mouth daily.   ezetimibe (ZETIA) 10 MG tablet Take 10 mg by mouth daily.   fluticasone (FLONASE) 50 MCG/ACT nasal spray Place 1 spray into both nostrils daily as needed for allergies.   losartan (COZAAR) 25 MG tablet Take 25 mg by mouth daily.   memantine (NAMENDA) 10 MG tablet Take 10 mg by mouth daily.   Multiple Vitamin (MULTI-VITAMINS) TABS Take 1 tablet by mouth daily.   nitroGLYCERIN (NITROSTAT) 0.4 MG SL tablet Place 0.4 mg under the tongue every 5 (five) minutes as needed for chest pain.    rivastigmine (EXELON) 13.3 MG/24HR Place 13.3 mg onto the skin daily.   rosuvastatin (CRESTOR) 5 MG tablet Take 5 mg by mouth daily.   sertraline (ZOLOFT) 50 MG tablet Take 25 mg by mouth at bedtime.   tamsulosin (FLOMAX) 0.4 MG CAPS capsule Take 0.4 mg by mouth daily.     Allergies:   Patient has no known allergies.   Social History   Socioeconomic History   Marital status: Married    Spouse name: Not on file   Number of children: Not on file   Years of education: Not on file   Highest education level: Not on file  Occupational History  Not on file  Tobacco Use   Smoking status: Never   Smokeless tobacco: Never  Vaping Use   Vaping Use: Never used  Substance and Sexual Activity   Alcohol use: No   Drug use: No   Sexual activity: Yes    Partners: Female    Comment: married  Other Topics Concern   Not on file  Social History Narrative   Not on file   Social Determinants of Health   Financial Resource Strain: Not on file  Food Insecurity: Not on file  Transportation Needs: Not on file  Physical Activity: Not on file  Stress: Not on file  Social Connections: Not on file     Family History: The patient's family history includes Alzheimer's disease in his mother; Lung cancer in his sister.  ROS:   Please see the history of present illness.     (+) Forgetfulness  All other systems reviewed and are negative.  EKGs/Labs/Other Studies Reviewed:    The following studies were reviewed today: CTA Neck and Head 05/05/2017 1.  No acute arterial abnormality in the head and neck.  No acture infarct. Consider brain MRI for further evaluation.  2.  Chronic microvascular ischemic disease.  3.  Intracranial atherosclerosis.  Monitor 02/11/2016 Normal sinus rhythm with no significant arrhythmia overall. Lowest heart rate was 49 bpm with no pauses greater than 2 seconds. No significant tachyarrhythmia.  ECHO 01/22/2016 - Left ventricle: The cavity size was normal. Wall thickness was    normal. Systolic function was normal. The estimated ejection    fraction was in the range of 55% to 60%. Wall motion was normal;    there were no regional wall motion abnormalities. Doppler    parameters are consistent with abnormal left ventricular    relaxation (grade 1 diastolic dysfunction).  - Aortic valve: Mildly calcified annulus. Trileaflet; mildly    calcified leaflets.  - Mitral valve: Calcified annulus. There was mild regurgitation.  - Right atrium: Central venous pressure (est): 3 mm Hg.  - Tricuspid valve: There was mild regurgitation.  - Pulmonary arteries: PA peak pressure: 26 mm Hg (S).  - Pericardium, extracardiac: There was no pericardial effusion.  Impressions:  - Normal LV wall thickness with LVEF 55-60%. Grade 1 diastolic    dysfunction with normal estimated LV filling pressure. MAC with    mild mitral regurgitation. Mildly sclerotic aortic annulus. Mild    tricuspid regurgitation with PASP 26 mmHg.   EKG:  EKG was personally reviewed 04/14/21: Sinus rhythm, rate 69 bpm 03/28/20: sinus rhythm 59, 1 AV paced beat.  Recent Labs: No results found for requested labs within last 365 days.  Recent Lipid Panel No results found for: "CHOL", "TRIG", "HDL", "CHOLHDL", "VLDL", "LDLCALC", "LDLDIRECT"   Physical Exam:    VS:  BP 122/60    Pulse 63   Ht 5' 8.5" (1.74 m)   Wt 193 lb (87.5 kg)   SpO2 95%   BMI 28.92 kg/m     Wt Readings from Last 3 Encounters:  08/03/22 193 lb (87.5 kg)  07/23/22 193 lb 12.8 oz (87.9 kg)  07/11/21 191 lb (86.6 kg)     GEN: Well nourished, well developed, in no acute distress HEENT: normal Neck: no JVD, carotid bruits, or masses Cardiac: RRR; no murmurs, rubs, or gallops,no edema , pacer Respiratory:  clear to auscultation bilaterally, normal work of breathing GI: soft, nontender, nondistended, + BS MS: no deformity or atrophy Skin: warm and dry, no rash Neuro:  Alert  and Oriented x 3, Strength and sensation are intact Psych: euthymic mood, full affect   ASSESSMENT:    1. Sick sinus syndrome ([place])   2. Coronary artery disease of native artery of native heart with stable angina pectoris ([place])   3. Essential hypertension   4. OSA treated with BiPAP      PLAN:    Coronary artery disease involving native coronary artery of native heart without angina pectoris Had PTCA to circumflex artery without stent in 1995.  Doing well no anginal symptoms.  Continue with goal-directed medical therapy including aspirin statin angiotensin receptor blocker.  Feeling well.  Benign essential HTN On losartan 25 mg a day.  No changes made.  OSA treated with BiPAP Not as compliant with his BiPAP as he used to be.  Does have some more daytime somnolence.  Dr. Radford Nelson  Sick sinus syndrome [facility]) Pacemaker in place.  Monitored previously by Dr. Rayann Nelson, now Dr. Myles Nelson.  Excellent.  Functioning well.  Rarely using ventricular pacing.  Memory impairment Currently on Exelon as well as Namenda.  His wife Joel Nelson thinks that memory is gently declining.  He is still driving with her in the car.    Medication Adjustments/Labs and Tests Ordered: Current medicines are reviewed at length with the patient today.  Concerns regarding medicines are outlined above.  No orders of the defined types were placed in  this encounter.   No orders of the defined types were placed in this encounter.    Patient Instructions  Medication Instructions:  The current medical regimen is effective;  continue present plan and medications.  *If you need a refill on your cardiac medications before your next appointment, please call your pharmacy*  Follow-Up: At [facility], you and your health needs are our priority.  As part of our continuing mission to provide you with exceptional heart care, we have created designated Provider Care Teams.  These Care Teams include your primary Cardiologist (physician) and Advanced Practice Providers (APPs -  Physician Assistants and Nurse Practitioners) who all work together to provide you with the care you need, when you need it.  We recommend signing up for the patient portal called "MyChart".  Sign up information is provided on this After Visit Summary.  MyChart is used to connect with patients for Virtual Visits (Telemedicine).  Patients are able to view lab/test results, encounter notes, upcoming appointments, etc.  Non-urgent messages can be sent to your provider as well.   To learn more about what you can do with MyChart, go to [email or web address].    Your next appointment:   1 year(s)  Provider:   Candee Furbish, MD         Signed, Joel Furbish, MD  08/03/2022 8:50 AM    [place]

## 2022-08-03 NOTE — Patient Instructions (Signed)
Medication Instructions:  The current medical regimen is effective;  continue present plan and medications.  *If you need a refill on your cardiac medications before your next appointment, please call your pharmacy*  Follow-Up: At [facility], you and your health needs are our priority.  As part of our continuing mission to provide you with exceptional heart care, we have created designated Provider Care Teams.  These Care Teams include your primary Cardiologist (physician) and Advanced Practice Providers (APPs -  Physician Assistants and Nurse Practitioners) who all work together to provide you with the care you need, when you need it.  We recommend signing up for the patient portal called "MyChart".  Sign up information is provided on this After Visit Summary.  MyChart is used to connect with patients for Virtual Visits (Telemedicine).  Patients are able to view lab/test results, encounter notes, upcoming appointments, etc.  Non-urgent messages can be sent to your provider as well.   To learn more about what you can do with MyChart, go to [email or web address].    Your next appointment:   1 year(s)  Provider:   Candee Furbish, MD

## 2022-08-11 DIAGNOSIS — H43393 Other vitreous opacities, bilateral: Secondary | ICD-10-CM | POA: Diagnosis not present

## 2022-08-11 NOTE — Progress Notes (Signed)
Remote pacemaker transmission.   

## 2022-08-12 DIAGNOSIS — H663X2 Other chronic suppurative otitis media, left ear: Secondary | ICD-10-CM | POA: Diagnosis not present

## 2022-09-28 DIAGNOSIS — Z85828 Personal history of other malignant neoplasm of skin: Secondary | ICD-10-CM | POA: Diagnosis not present

## 2022-09-28 DIAGNOSIS — D239 Other benign neoplasm of skin, unspecified: Secondary | ICD-10-CM | POA: Diagnosis not present

## 2022-09-28 DIAGNOSIS — L570 Actinic keratosis: Secondary | ICD-10-CM | POA: Diagnosis not present

## 2022-09-28 DIAGNOSIS — Z1283 Encounter for screening for malignant neoplasm of skin: Secondary | ICD-10-CM | POA: Diagnosis not present

## 2022-10-14 ENCOUNTER — Ambulatory Visit (INDEPENDENT_AMBULATORY_CARE_PROVIDER_SITE_OTHER): Payer: Medicare PPO

## 2022-10-14 DIAGNOSIS — I495 Sick sinus syndrome: Secondary | ICD-10-CM | POA: Diagnosis not present

## 2022-10-14 DIAGNOSIS — Z6828 Body mass index (BMI) 28.0-28.9, adult: Secondary | ICD-10-CM | POA: Diagnosis not present

## 2022-10-14 DIAGNOSIS — J309 Allergic rhinitis, unspecified: Secondary | ICD-10-CM | POA: Diagnosis not present

## 2022-10-14 LAB — CUP PACEART REMOTE DEVICE CHECK
Battery Remaining Longevity: 66 mo
Battery Remaining Percentage: 50 %
Battery Voltage: 2.99 V
Brady Statistic AP VP Percent: 1 %
Brady Statistic AP VS Percent: 1 %
Brady Statistic AS VP Percent: 1 %
Brady Statistic AS VS Percent: 99 %
Brady Statistic RA Percent Paced: 1 %
Brady Statistic RV Percent Paced: 1 %
Date Time Interrogation Session: 20240508020353
Implantable Lead Connection Status: 753985
Implantable Lead Connection Status: 753985
Implantable Lead Implant Date: 20171017
Implantable Lead Implant Date: 20171017
Implantable Lead Location: 753859
Implantable Lead Location: 753860
Implantable Pulse Generator Implant Date: 20171017
Lead Channel Impedance Value: 400 Ohm
Lead Channel Impedance Value: 450 Ohm
Lead Channel Pacing Threshold Amplitude: 0.5 V
Lead Channel Pacing Threshold Amplitude: 0.75 V
Lead Channel Pacing Threshold Pulse Width: 0.5 ms
Lead Channel Pacing Threshold Pulse Width: 0.5 ms
Lead Channel Sensing Intrinsic Amplitude: 2.6 mV
Lead Channel Sensing Intrinsic Amplitude: 7.9 mV
Lead Channel Setting Pacing Amplitude: 1.5 V
Lead Channel Setting Pacing Amplitude: 2.5 V
Lead Channel Setting Pacing Pulse Width: 0.5 ms
Lead Channel Setting Sensing Sensitivity: 2 mV
Pulse Gen Model: 2272
Pulse Gen Serial Number: 7946710

## 2022-10-22 DIAGNOSIS — E1122 Type 2 diabetes mellitus with diabetic chronic kidney disease: Secondary | ICD-10-CM | POA: Diagnosis not present

## 2022-10-22 DIAGNOSIS — E785 Hyperlipidemia, unspecified: Secondary | ICD-10-CM | POA: Diagnosis not present

## 2022-10-26 DIAGNOSIS — N400 Enlarged prostate without lower urinary tract symptoms: Secondary | ICD-10-CM | POA: Diagnosis not present

## 2022-10-26 DIAGNOSIS — E1122 Type 2 diabetes mellitus with diabetic chronic kidney disease: Secondary | ICD-10-CM | POA: Diagnosis not present

## 2022-10-26 DIAGNOSIS — G309 Alzheimer's disease, unspecified: Secondary | ICD-10-CM | POA: Diagnosis not present

## 2022-10-26 DIAGNOSIS — R7301 Impaired fasting glucose: Secondary | ICD-10-CM | POA: Diagnosis not present

## 2022-10-26 DIAGNOSIS — R809 Proteinuria, unspecified: Secondary | ICD-10-CM | POA: Diagnosis not present

## 2022-10-26 DIAGNOSIS — Z0001 Encounter for general adult medical examination with abnormal findings: Secondary | ICD-10-CM | POA: Diagnosis not present

## 2022-10-26 DIAGNOSIS — F0280 Dementia in other diseases classified elsewhere without behavioral disturbance: Secondary | ICD-10-CM | POA: Diagnosis not present

## 2022-10-26 DIAGNOSIS — I10 Essential (primary) hypertension: Secondary | ICD-10-CM | POA: Diagnosis not present

## 2022-10-26 DIAGNOSIS — E7849 Other hyperlipidemia: Secondary | ICD-10-CM | POA: Diagnosis not present

## 2022-11-04 NOTE — Progress Notes (Signed)
Remote pacemaker transmission.   

## 2022-12-03 DIAGNOSIS — R350 Frequency of micturition: Secondary | ICD-10-CM | POA: Diagnosis not present

## 2022-12-03 DIAGNOSIS — Z6829 Body mass index (BMI) 29.0-29.9, adult: Secondary | ICD-10-CM | POA: Diagnosis not present

## 2022-12-03 DIAGNOSIS — N401 Enlarged prostate with lower urinary tract symptoms: Secondary | ICD-10-CM | POA: Diagnosis not present

## 2022-12-03 DIAGNOSIS — N138 Other obstructive and reflux uropathy: Secondary | ICD-10-CM | POA: Diagnosis not present

## 2023-01-01 ENCOUNTER — Telehealth: Payer: Self-pay

## 2023-01-01 NOTE — Telephone Encounter (Signed)
Scheduled for 01/04/23 for virtual visit.

## 2023-01-01 NOTE — Telephone Encounter (Signed)
Spoke to patient and spouse. Patient was seen for virtual visit in 2021 for sleep apnea. Patient now with dementia but needs cpap equipment, requesting visit to get new supplies, requesting appt. Spouse states virtual would be easier for family as he has difficulty with mobility as well as memory.

## 2023-01-04 ENCOUNTER — Ambulatory Visit: Payer: Medicare PPO | Admitting: Cardiology

## 2023-01-04 ENCOUNTER — Encounter: Payer: Self-pay | Admitting: Cardiology

## 2023-01-04 VITALS — BP 130/70 | HR 72 | Ht 68.0 in | Wt 190.0 lb

## 2023-01-04 NOTE — Progress Notes (Unsigned)
Virtual Visit via Video Note   This visit type was conducted due to national recommendations for restrictions regarding the COVID-19 Pandemic (e.g. social distancing) in an effort to limit this patient's exposure and mitigate transmission in our community.  Due to his co-morbid illnesses, this patient is at least at moderate risk for complications without adequate follow up.  This format is felt to be most appropriate for this patient at this time.  The patient did  have access to video technology and a limited PE was performed.  All issues noted in this document were discussed and addressed.   Please refer to the patient's chart for his  consent to telehealth for Joel Nelson.   Evaluation Performed:  Follow-up visit  Date:  01/04/2023   ID:  Joel Nelson, DOB November 24, 1933, MRN [id number]  Patient Location:  Home  Provider location:   [place]  PCP:  Estanislado Pandy, MD  Cardiologist:  Donato Schultz, MD Sleep Medicine:  Armanda Magic, MD Electrophysiologist:  Hillis Range, MD (Inactive)   Chief Complaint:  OSA, HTN  History of Present Illness:    Joel Nelson is a 89 y.o. male who presents via audio/video conferencing for a telehealth visit today.    Joel Nelson is an 89 y.o. male with a hx of CAD, hyperlipidemia and hypertension who was referred by Dr. Purvis Sheffield for sleep study due to bradycardia as well as snoring.  He was found to have moderate obstructive sleep apnea with an AHI of 18.7/h and mild central sleep apnea with his CAI of 5.4/h.  Oxygen desaturations were noted down to 83%.  There was moderate snoring.  He had unsuccessful PAP titration due to ongoing respiratory events and was eventually titrated on BiPAP 9/5 centimeters H2O.   He is doing well with his PAP device and thinks that he has gotten used to it.  He tolerates the mask and feels the pressure is adequate.  Since going on PAP he feels rested in the am and has no significant daytime sleepiness.  He denies  any significant mouth or nasal dryness or nasal congestion.  He does not think that he snores.    Prior CV studies:   The following studies were reviewed today:  PAP compliance download  Past Medical History:  Diagnosis Date   Coronary artery disease 1995    remote left circumflex angioplasty without stent placement in 1995   Excessive daytime sleepiness 11/22/2017   History of kidney stones    History of shingles    Hyperlipidemia    Hypertension    Melanoma ([facility])    Memory loss    OSA treated with BiPAP    moderate obstructive sleep apnea with an AHI of 18.7/h and mild central sleep apnea with his CAI of 5.4/h.On BiPAP at 9/5cm H2O   Pre-diabetes    Presence of permanent cardiac pacemaker 03/24/2016   Sick sinus syndrome ([facility]) 03/24/2016   Syncope 03/24/2016   Past Surgical History:  Procedure Laterality Date   EP IMPLANTABLE DEVICE N/A 03/24/2016   SJM Assurity MRI PPM implanted by Dr Johney Frame for sick sinus syndrome   herniated disk repair       No outpatient medications have been marked as taking for the 01/04/23 encounter (Video Visit) with Quintella Reichert, MD.     Allergies:   Patient has no known allergies.   Social History   Tobacco Use   Smoking status: Never   Smokeless tobacco: Never  Vaping Use   Vaping status:  Never Used  Substance Use Topics   Alcohol use: No   Drug use: No     Family Hx: The patient's family history includes Alzheimer's disease in his mother; Lung cancer in his sister.  ROS:   Please see the history of present illness.     All other systems reviewed and are negative.   Labs/Other Tests and Data Reviewed:    Recent Labs: No results found for requested labs within last 365 days.   Recent Lipid Panel No results found for: "CHOL", "TRIG", "HDL", "CHOLHDL", "LDLCALC", "LDLDIRECT"  Wt Readings from Last 3 Encounters:  01/04/23 190 lb (86.2 kg)  08/03/22 193 lb (87.5 kg)  07/23/22 193 lb 12.8 oz (87.9 kg)     Objective:     Vital Signs:  BP 130/70   Pulse 72   Ht 5\' 8"  (1.727 m)   Wt 190 lb (86.2 kg)   BMI 28.89 kg/m   Well nourished, well developed male in no acute distress. Well appearing, alert and conversant, regular work of breathing,  good skin color  Eyes- anicteric mouth- oral mucosa is pink  neuro- grossly intact skin- no apparent rash or lesions or cyanosis  ASSESSMENT & PLAN:    1.  OSA - The patient is tolerating PAP therapy well without any problems. The PAP download performed by his DME was personally reviewed and interpreted by me today and showed an AHI of 0/hr on Biapap at 9/5 cm H2O with 0% compliance in using more than 4 hours nightly.  The patient has been using and benefiting from PAP use and will continue to benefit from therapy.    2.  Hypertension  -BP well-controlled -Continue prescription drug management with losartan 25 mg daily with as needed refills  Time:   Today, I have spent 15 minutes on telemedicine discussing medical problems including OSA as well as reviewing patient's chart including PAP compliance download.  Medication Adjustments/Labs and Tests Ordered: Current medicines are reviewed at length with the patient today.  Concerns regarding medicines are outlined above.  Tests Ordered: No orders of the defined types were placed in this encounter.  Medication Changes: No orders of the defined types were placed in this encounter.   Disposition:  Follow up in 1 year(s)  Signed, Armanda Magic, MD  01/04/2023 10:04 AM    [facility]

## 2023-01-08 ENCOUNTER — Telehealth: Payer: Self-pay | Admitting: *Deleted

## 2023-01-08 ENCOUNTER — Ambulatory Visit: Payer: Medicare PPO | Attending: Cardiology | Admitting: Cardiology

## 2023-01-08 ENCOUNTER — Encounter: Payer: Self-pay | Admitting: Cardiology

## 2023-01-08 VITALS — BP 119/62 | HR 72 | Ht 68.5 in | Wt 196.6 lb

## 2023-01-08 DIAGNOSIS — I10 Essential (primary) hypertension: Secondary | ICD-10-CM

## 2023-01-08 DIAGNOSIS — G4733 Obstructive sleep apnea (adult) (pediatric): Secondary | ICD-10-CM

## 2023-01-08 DIAGNOSIS — I25118 Atherosclerotic heart disease of native coronary artery with other forms of angina pectoris: Secondary | ICD-10-CM

## 2023-01-08 DIAGNOSIS — G4719 Other hypersomnia: Secondary | ICD-10-CM

## 2023-01-08 NOTE — Progress Notes (Signed)
Sleep Medicine CONSULT Note    Date:  01/08/2023   ID:  Joel Nelson, DOB 1933/08/18, MRN [id number]  PCP:  Estanislado Pandy, MD  Cardiologist: Donato Schultz, MD   Chief Complaint  Patient presents with   New Patient (Initial Visit)    Obstructive sleep apnea    History of Present Illness:  Joel Nelson is a 89 y.o. male who is being seen today for the evaluation of obstructive sleep apnea at the request of Donato Schultz, MD.  This is an 89 year old male with a history of coronary disease, hypertension, hyperlipidemia, sick sinus syndrome status post permanent pacemaker as well as a history of moderate obstructive sleep apnea with an AHI of 18.7/h at the time of cath who has been on BiPAP at 9/5 cm H2O.  He was lost to follow-up with the last time he saw me over 3 years ago and is now referred back by Dr. Anne Fu to reestablish sleep medicine care with sleep consultation.  His wife is with him today and says that he has not used his device in over a year.  He had COVID 19 twice last year and had a stopped up nose most of the year and did not tolerate the mask.  His DME closed and he has not been able to get a new DME and needs supplies.  She also thinks that there is mold in the water chamber.    Past Medical History:  Diagnosis Date   Coronary artery disease 1995    remote left circumflex angioplasty without stent placement in 1995   Excessive daytime sleepiness 11/22/2017   History of kidney stones    History of shingles    Hyperlipidemia    Hypertension    Melanoma ([facility])    Memory loss    OSA treated with BiPAP    moderate obstructive sleep apnea with an AHI of 18.7/h and mild central sleep apnea with his CAI of 5.4/h.On BiPAP at 9/5cm H2O   Pre-diabetes    Presence of permanent cardiac pacemaker 03/24/2016   Sick sinus syndrome ([facility]) 03/24/2016   Syncope 03/24/2016    Past Surgical History:  Procedure Laterality Date   EP IMPLANTABLE DEVICE N/A 03/24/2016   SJM Assurity  MRI PPM implanted by Dr Johney Frame for sick sinus syndrome   herniated disk repair      Current Medications: Current Meds  Medication Sig   aspirin EC 81 MG tablet Take 162 mg by mouth in the morning and at bedtime.   Cyanocobalamin 1000 MCG TBCR Take 1 tablet by mouth daily.   ezetimibe (ZETIA) 10 MG tablet Take 10 mg by mouth daily.   fluticasone (FLONASE) 50 MCG/ACT nasal spray Place 1 spray into both nostrils daily as needed for allergies.   losartan (COZAAR) 25 MG tablet Take 25 mg by mouth daily.   memantine (NAMENDA) 10 MG tablet Take 10 mg by mouth daily.   Multiple Vitamin (MULTI-VITAMINS) TABS Take 1 tablet by mouth daily.   nitroGLYCERIN (NITROSTAT) 0.4 MG SL tablet Place 0.4 mg under the tongue every 5 (five) minutes as needed for chest pain.    rivastigmine (EXELON) 13.3 MG/24HR Place 13.3 mg onto the skin daily.   rosuvastatin (CRESTOR) 5 MG tablet Take 5 mg by mouth daily.   sertraline (ZOLOFT) 50 MG tablet Take 25 mg by mouth at bedtime.   tamsulosin (FLOMAX) 0.4 MG CAPS capsule Take 0.4 mg by mouth daily.    Allergies:   Patient has  no known allergies.   Social History   Socioeconomic History   Marital status: Married    Spouse name: Not on file   Number of children: Not on file   Years of education: Not on file   Highest education level: Not on file  Occupational History   Not on file  Tobacco Use   Smoking status: Never   Smokeless tobacco: Never  Vaping Use   Vaping status: Never Used  Substance and Sexual Activity   Alcohol use: No   Drug use: No   Sexual activity: Yes    Partners: Female    Comment: married  Other Topics Concern   Not on file  Social History Narrative   Not on file   Social Determinants of Health   Financial Resource Strain: Not on file  Food Insecurity: Not on file  Transportation Needs: Not on file  Physical Activity: Not on file  Stress: Not on file  Social Connections: Not on file     Family History:  The patient's  family history includes Alzheimer's disease in his mother; Lung cancer in his sister.   ROS:   Please see the history of present illness.    ROS All other systems reviewed and are negative.      No data to display             PHYSICAL EXAM:   VS:  BP 119/62   Pulse 72   Ht 5' 8.5" (1.74 m)   Wt 196 lb 9.6 oz (89.2 kg)   SpO2 95%   BMI 29.46 kg/m    GEN: Well nourished, well developed, in no acute distress  HEENT: normal  Neck: no JVD, carotid bruits, or masses Cardiac: RRR; no murmurs, rubs, or gallops,no edema.  Intact distal pulses bilaterally.  Respiratory:  clear to auscultation bilaterally, normal work of breathing GI: soft, nontender, nondistended, + BS MS: no deformity or atrophy  Skin: warm and dry, no rash Neuro:  Alert and Oriented x 3, Strength and sensation are intact Psych: euthymic mood, full affect  Wt Readings from Last 3 Encounters:  01/08/23 196 lb 9.6 oz (89.2 kg)  01/04/23 190 lb (86.2 kg)  08/03/22 193 lb (87.5 kg)      Studies/Labs Reviewed:   PAP compliance download  Recent Labs: No results found for requested labs within last 365 days.    Additional studies/ records that were reviewed today include:  none    ASSESSMENT:    1. OSA treated with BiPAP   2. Essential hypertension      PLAN:  In order of problems listed above:  OSA - The PAP download performed by his DME was personally reviewed and interpreted by me today and showed an AHI of 0 /hr on BiPAP at 9/5 cm H2O with 0% compliance in using more than 4 hours nightly.   -I am going to order him a new ResMed BiPAP at 9/5cm H2O with heated humidity and mask of choice with [employer]   2.  Hypertension -BP controlled on exam today -Continue prescription drug management with losartan 25 mg daily with as needed refills   Time Spent: 20 minutes total time of encounter, including 15 minutes spent in face-to-face patient care on the date of this encounter. This time includes  coordination of care and counseling regarding above mentioned problem list. Remainder of non-face-to-face time involved reviewing chart documents/testing relevant to the patient encounter and documentation in the medical record. I have independently reviewed documentation  from referring provider  Medication Adjustments/Labs and Tests Ordered: Current medicines are reviewed at length with the patient today.  Concerns regarding medicines are outlined above.  Medication changes, Labs and Tests ordered today are listed in the Patient Instructions below.  There are no Patient Instructions on file for this visit.   Signed, Armanda Magic, MD  01/08/2023 8:36 AM    [facility] [address] [place], [place], [place]  [address] Phone: [phone]; Fax: [phone]

## 2023-01-08 NOTE — Patient Instructions (Signed)
Medication Instructions:  Your physician recommends that you continue on your current medications as directed. Please refer to the Current Medication list given to you today.  *If you need a refill on your cardiac medications before your next appointment, please call your pharmacy*   Lab Work: None.  If you have labs (blood work) drawn today and your tests are completely normal, you will receive your results only by: MyChart Message (if you have MyChart) OR A paper copy in the mail If you have any lab test that is abnormal or we need to change your treatment, we will call you to review the results.   Testing/Procedures: None.   Follow-Up: At  [facility], you and your health needs are our priority.  As part of our continuing mission to provide you with exceptional heart care, we have created designated Provider Care Teams.  These Care Teams include your primary Cardiologist (physician) and Advanced Practice Providers (APPs -  Physician Assistants and Nurse Practitioners) who all work together to provide you with the care you need, when you need it.  We recommend signing up for the patient portal called "MyChart".  Sign up information is provided on this After Visit Summary.  MyChart is used to connect with patients for Virtual Visits (Telemedicine).  Patients are able to view lab/test results, encounter notes, upcoming appointments, etc.  Non-urgent messages can be sent to your provider as well.   To learn more about what you can do with MyChart, go to [email or web address].    Your next appointment:   1 year(s)  Provider:   Dr. Traci Turner, MD   

## 2023-01-08 NOTE — Telephone Encounter (Signed)
Per Dr Mayford Knife, order him a new ResMed BiPAP at 9/5cm H2O with heated humidity and mask of choice

## 2023-01-08 NOTE — Telephone Encounter (Signed)
Order placed to [facility]  Upon patient request DME selection is [facility] Patient understands he will be contacted by [facility] to set up his cpap. Patient understands to call if [facility] does not contact him with new setup in a timely manner. Patient understands they will be called once confirmation has been received from [place] that they have received their new machine to schedule 10 week follow up appointment.   [facility] notified of new cpap order  Please add to airview Patient was grateful for the call and thanked me

## 2023-01-13 ENCOUNTER — Ambulatory Visit (INDEPENDENT_AMBULATORY_CARE_PROVIDER_SITE_OTHER): Payer: Medicare PPO

## 2023-01-13 DIAGNOSIS — I495 Sick sinus syndrome: Secondary | ICD-10-CM | POA: Diagnosis not present

## 2023-01-13 LAB — CUP PACEART REMOTE DEVICE CHECK
Battery Remaining Longevity: 62 mo
Battery Remaining Percentage: 48 %
Battery Voltage: 2.99 V
Brady Statistic AP VP Percent: 1 %
Brady Statistic AP VS Percent: 1 %
Brady Statistic AS VP Percent: 1 %
Brady Statistic AS VS Percent: 99 %
Brady Statistic RA Percent Paced: 1 %
Brady Statistic RV Percent Paced: 1 %
Date Time Interrogation Session: 20240807020015
Implantable Lead Connection Status: 753985
Implantable Lead Connection Status: 753985
Implantable Lead Implant Date: 20171017
Implantable Lead Implant Date: 20171017
Implantable Lead Location: 753859
Implantable Lead Location: 753860
Implantable Pulse Generator Implant Date: 20171017
Lead Channel Impedance Value: 390 Ohm
Lead Channel Impedance Value: 440 Ohm
Lead Channel Pacing Threshold Amplitude: 0.5 V
Lead Channel Pacing Threshold Amplitude: 0.75 V
Lead Channel Pacing Threshold Pulse Width: 0.5 ms
Lead Channel Pacing Threshold Pulse Width: 0.5 ms
Lead Channel Sensing Intrinsic Amplitude: 2.7 mV
Lead Channel Sensing Intrinsic Amplitude: 6.6 mV
Lead Channel Setting Pacing Amplitude: 1.5 V
Lead Channel Setting Pacing Amplitude: 2.5 V
Lead Channel Setting Pacing Pulse Width: 0.5 ms
Lead Channel Setting Sensing Sensitivity: 2 mV
Pulse Gen Model: 2272
Pulse Gen Serial Number: 7946710

## 2023-01-18 DIAGNOSIS — G4733 Obstructive sleep apnea (adult) (pediatric): Secondary | ICD-10-CM | POA: Diagnosis not present

## 2023-01-22 DIAGNOSIS — G4733 Obstructive sleep apnea (adult) (pediatric): Secondary | ICD-10-CM | POA: Diagnosis not present

## 2023-01-28 NOTE — Progress Notes (Signed)
Remote pacemaker transmission.   

## 2023-02-17 DIAGNOSIS — R7989 Other specified abnormal findings of blood chemistry: Secondary | ICD-10-CM | POA: Diagnosis not present

## 2023-02-17 DIAGNOSIS — E1129 Type 2 diabetes mellitus with other diabetic kidney complication: Secondary | ICD-10-CM | POA: Diagnosis not present

## 2023-02-17 DIAGNOSIS — E1122 Type 2 diabetes mellitus with diabetic chronic kidney disease: Secondary | ICD-10-CM | POA: Diagnosis not present

## 2023-02-17 DIAGNOSIS — E7849 Other hyperlipidemia: Secondary | ICD-10-CM | POA: Diagnosis not present

## 2023-02-17 DIAGNOSIS — E7801 Familial hypercholesterolemia: Secondary | ICD-10-CM | POA: Diagnosis not present

## 2023-02-18 DIAGNOSIS — G4733 Obstructive sleep apnea (adult) (pediatric): Secondary | ICD-10-CM | POA: Diagnosis not present

## 2023-02-22 DIAGNOSIS — R809 Proteinuria, unspecified: Secondary | ICD-10-CM | POA: Diagnosis not present

## 2023-02-22 DIAGNOSIS — N400 Enlarged prostate without lower urinary tract symptoms: Secondary | ICD-10-CM | POA: Diagnosis not present

## 2023-02-22 DIAGNOSIS — E1122 Type 2 diabetes mellitus with diabetic chronic kidney disease: Secondary | ICD-10-CM | POA: Diagnosis not present

## 2023-02-22 DIAGNOSIS — F0280 Dementia in other diseases classified elsewhere without behavioral disturbance: Secondary | ICD-10-CM | POA: Diagnosis not present

## 2023-02-22 DIAGNOSIS — R7301 Impaired fasting glucose: Secondary | ICD-10-CM | POA: Diagnosis not present

## 2023-02-22 DIAGNOSIS — E782 Mixed hyperlipidemia: Secondary | ICD-10-CM | POA: Diagnosis not present

## 2023-02-22 DIAGNOSIS — E7849 Other hyperlipidemia: Secondary | ICD-10-CM | POA: Diagnosis not present

## 2023-02-22 DIAGNOSIS — G309 Alzheimer's disease, unspecified: Secondary | ICD-10-CM | POA: Diagnosis not present

## 2023-02-22 DIAGNOSIS — E1129 Type 2 diabetes mellitus with other diabetic kidney complication: Secondary | ICD-10-CM | POA: Diagnosis not present

## 2023-02-22 DIAGNOSIS — I10 Essential (primary) hypertension: Secondary | ICD-10-CM | POA: Diagnosis not present

## 2023-03-06 ENCOUNTER — Other Ambulatory Visit: Payer: Self-pay

## 2023-03-06 ENCOUNTER — Emergency Department (HOSPITAL_COMMUNITY): Payer: Medicare PPO

## 2023-03-06 ENCOUNTER — Emergency Department (HOSPITAL_COMMUNITY)
Admission: EM | Admit: 2023-03-06 | Discharge: 2023-03-06 | Disposition: A | Discharge status: Home / Self Care | Payer: Medicare PPO | Attending: Emergency Medicine | Admitting: Emergency Medicine

## 2023-03-06 ENCOUNTER — Encounter (HOSPITAL_COMMUNITY): Payer: Self-pay | Admitting: *Deleted

## 2023-03-06 DIAGNOSIS — F0390 Unspecified dementia without behavioral disturbance: Secondary | ICD-10-CM | POA: Diagnosis not present

## 2023-03-06 DIAGNOSIS — Y92511 Restaurant or cafe as the place of occurrence of the external cause: Secondary | ICD-10-CM | POA: Diagnosis not present

## 2023-03-06 DIAGNOSIS — Z79899 Other long term (current) drug therapy: Secondary | ICD-10-CM | POA: Diagnosis not present

## 2023-03-06 DIAGNOSIS — W01198A Fall on same level from slipping, tripping and stumbling with subsequent striking against other object, initial encounter: Secondary | ICD-10-CM | POA: Diagnosis not present

## 2023-03-06 DIAGNOSIS — Z85820 Personal history of malignant melanoma of skin: Secondary | ICD-10-CM | POA: Insufficient documentation

## 2023-03-06 DIAGNOSIS — I2510 Atherosclerotic heart disease of native coronary artery without angina pectoris: Secondary | ICD-10-CM | POA: Insufficient documentation

## 2023-03-06 DIAGNOSIS — Z7982 Long term (current) use of aspirin: Secondary | ICD-10-CM | POA: Insufficient documentation

## 2023-03-06 DIAGNOSIS — S0990XA Unspecified injury of head, initial encounter: Secondary | ICD-10-CM

## 2023-03-06 DIAGNOSIS — I10 Essential (primary) hypertension: Secondary | ICD-10-CM | POA: Insufficient documentation

## 2023-03-06 DIAGNOSIS — S0081XA Abrasion of other part of head, initial encounter: Secondary | ICD-10-CM | POA: Insufficient documentation

## 2023-03-06 DIAGNOSIS — W19XXXA Unspecified fall, initial encounter: Secondary | ICD-10-CM

## 2023-03-06 HISTORY — DX: Unspecified dementia, unspecified severity, without behavioral disturbance, psychotic disturbance, mood disturbance, and anxiety: F03.90

## 2023-03-06 NOTE — ED Provider Notes (Signed)
[place] EMERGENCY DEPARTMENT AT [facility] Provider Note   CSN: [id number] Arrival date & time: 03/06/23  1210     History  Chief Complaint  Patient presents with   Joel Nelson is a 89 y.o. male.   Fall   89 year old male presents emergency department with complaints of fall.  Patient accompanied by wife who is primary story given patient's baseline dementia and easily forgetfulness.  Wife states that patient was walking on Thursday into a restaurant and tripped on a step and hit the left side of his forehead on a nearby wall.  Denies loss of consciousness.  Patient is on aspirin daily but no anticoagulation.  Since then, has been at baseline.  Patient has repeatedly forgot about event but has touches forehead and asked what happened to him."  Denies any headache, chest pain, shortness of breath, abdominal pain, nausea or vomiting, visual disturbance, gait abnormality, slurred speech, facial droop, weakness/sensory deficits in the lower extremities.  Past medical history significant for dementia, excessive daytime sleepiness, CAD, hypertension, hyperlipidemia, OSA, melanoma, sick sinus syndrome, syncope  Home Medications Prior to Admission medications   Medication Sig Start Date End Date Taking? Authorizing Provider  aspirin EC 81 MG tablet Take 162 mg by mouth in the morning and at bedtime.    [provider]  Cyanocobalamin 1000 MCG TBCR Take 1 tablet by mouth daily.    [provider]  ezetimibe (ZETIA) 10 MG tablet Take 10 mg by mouth daily. 12/01/15   [provider]  fluticasone (FLONASE) 50 MCG/ACT nasal spray Place 1 spray into both nostrils daily as needed for allergies.    [provider]  losartan (COZAAR) 25 MG tablet Take 25 mg by mouth daily. 12/22/13   [provider]  memantine (NAMENDA) 10 MG tablet Take 10 mg by mouth daily.    [provider]  Multiple Vitamin (MULTI-VITAMINS) TABS Take 1  tablet by mouth daily.    [provider]  nitroGLYCERIN (NITROSTAT) 0.4 MG SL tablet Place 0.4 mg under the tongue every 5 (five) minutes as needed for chest pain.  08/11/10   [provider]  rivastigmine (EXELON) 13.3 MG/24HR Place 13.3 mg onto the skin daily. 04/16/20   [provider]  rosuvastatin (CRESTOR) 5 MG tablet Take 5 mg by mouth daily.    [provider]  sertraline (ZOLOFT) 50 MG tablet Take 25 mg by mouth at bedtime.    [provider]  tamsulosin (FLOMAX) 0.4 MG CAPS capsule Take 0.4 mg by mouth daily. 11/21/15   [provider]      Allergies    Patient has no known allergies.    Review of Systems   Review of Systems  All other systems reviewed and are negative.   Physical Exam Updated Vital Signs BP 136/80 (BP Location: Right Arm)   Pulse 72   Temp 98 F (36.7 C)   Resp 17   Ht 5' 8.5" (1.74 m)   Wt 86.2 kg   SpO2 96%   BMI 28.47 kg/m  Physical Exam Vitals and nursing note reviewed.  Constitutional:      General: He is not in acute distress.    Appearance: He is well-developed.  HENT:     Head: Normocephalic.     Comments: Abrasion to left forehead. Eyes:     Conjunctiva/sclera: Conjunctivae normal.  Cardiovascular:     Rate and Rhythm: Normal rate and regular rhythm.  Pulmonary:  Effort: Pulmonary effort is normal. No respiratory distress.     Breath sounds: Normal breath sounds. No wheezing or rales.  Abdominal:     Palpations: Abdomen is soft.     Tenderness: There is no abdominal tenderness. There is no guarding.  Musculoskeletal:        General: No swelling.     Cervical back: Neck supple.     Comments: No midline tenderness to cervical, thoracic, lumbar spine with no step-off or Forei noted.  Range of motion bilateral upper and lower extremities with no overlying tenderness to palpation.  No chest wall tenderness to palpation.  Skin:    General: Skin is warm and dry.     Capillary  Refill: Capillary refill takes less than 2 seconds.  Neurological:     Mental Status: He is alert.     Comments: Alert and oriented to self, place Speech is fluent, clear without dysarthria or dysphasia.   Strength symmetric in upper/lower extremities   Sensation intact in upper/lower extremities   Patient able to ambulate independently.   CN I not tested  CN II not tested  CN III, IV, VI PERRLA and EOMs intact bilaterally  CN V Intact sensation to sharp and light touch to the face  CN VII facial movements symmetric  CN VIII not tested  CN IX, X no uvula deviation, symmetric rise of soft palate  CN XI symmetric SCM and trapezius strength bilaterally  CN XII Midline tongue protrusion, symmetric L/R movements     Psychiatric:        Mood and Affect: Mood normal.     ED Results / Procedures / Treatments   Labs (all labs ordered are listed, but only abnormal results are displayed) Labs Reviewed - No data to display  EKG None  Radiology CT Head Wo Contrast  Result Date: 03/06/2023 CLINICAL DATA:  Head trauma EXAM: CT HEAD WITHOUT CONTRAST TECHNIQUE: Contiguous axial images were obtained from the base of the skull through the vertex without intravenous contrast. RADIATION DOSE REDUCTION: This exam was performed according to the departmental dose-optimization program which includes automated exposure control, adjustment of the mA and/or kV according to patient size and/or use of iterative reconstruction technique. COMPARISON:  None Available. FINDINGS: Brain: No evidence of acute infarction, hemorrhage, hydrocephalus, extra-axial collection or mass lesion/mass effect. Mild periventricular white matter hypodensity. Vascular: No hyperdense vessel or unexpected calcification. Skull: Normal. Negative for fracture or focal lesion. Sinuses/Orbits: No acute finding. Other: None. IMPRESSION: No acute intracranial pathology. Electronically Signed   By: Jearld Lesch M.D.   On: 03/06/2023 15:04     Procedures Procedures    Medications Ordered in ED Medications - No data to display  ED Course/ Medical Decision Making/ A&P                                 Medical Decision Making Amount and/or Complexity of Data Reviewed Radiology: ordered.   This patient presents to the ED for concern of fall, this involves an extensive number of treatment options, and is a complaint that carries with it a high risk of complications and morbidity.  The differential diagnosis includes abrasion, CVA, fracture, strain/pain, dislocation, other   Co morbidities that complicate the patient evaluation  See HPI   Additional history obtained:  Additional history obtained from EMR External records from outside source obtained and reviewed including hospital records   Lab Tests:  N/a  Imaging Studies ordered:  I ordered imaging studies including CT head I independently visualized and interpreted imaging which showed no acute intracranial abnormalities I agree with the radiologist interpretation   Cardiac Monitoring: / EKG:  The patient was maintained on a cardiac monitor.  I personally viewed and interpreted the cardiac monitored which showed an underlying rhythm of: Sinus rhythm   Consultations Obtained:  N/a   Problem List / ED Course / Critical interventions / Medication management  Fall, head injury Reevaluation of the patient showed that the patient stayed the same I have reviewed the patients home medicines and have made adjustments as needed   Social Determinants of Health:  Denies tobacco, licit drug use   Test / Admission - Considered:  Fall, head injury Vitals signs within normal range and stable throughout visit. Imaging studies significant for: See above 89 year old male presents emergency department after mechanical fall that occurred 3*days ago.  Patient lost consciousness or anticoagulation use but does take daily aspirin.  On exam, patient without any  focal neurologic deficit but given age as well as head injury, CT imaging of patient's head was obtained which was negative for any acute intracranial abnormality.  Low suspicion for CVA.  No other evidence of trauma on history/exam necessitating imaging/further workup clinically.  Patient reassured by overall workup.  Recommend follow-up with primary care for reassessment.  Treatment plan discussed at length with patient and he acknowledged understanding was agreeable to said plan. Worrisome signs and symptoms were discussed with the patient, and the patient acknowledged understanding to return to the ED if noticed. Patient was stable upon discharge.          Final Clinical Impression(s) / ED Diagnoses Final diagnoses:  Fall, initial encounter  Injury of head, initial encounter    Rx / DC Orders ED Discharge Orders     None         Peter Garter, [place] 03/06/23 1527    Gloris Manchester, MD 03/06/23 1630

## 2023-03-06 NOTE — ED Triage Notes (Signed)
Pt fell Thursday while walking in a restaurant.  Abrasion to left forehead. Pt with dementia.  Wife did not see the fall and unsure what pt hit with the fall, takes ASA daily. Wife denies any behavior changes. Pt alert and at baseline per wife.

## 2023-03-06 NOTE — ED Notes (Signed)
Patient resting in bed, alert oriented to person and place, disoriented to time.  Denies headache or chest pain at present

## 2023-03-06 NOTE — Discharge Instructions (Addendum)
As discussed, CT imaging was negative for any brain bleed or other abnormality.  Recommend routine follow-up with primary care.  Pleased do not hesitate to return to emergency department if the worrisome signs and symptoms we discussed become apparent.

## 2023-03-20 DIAGNOSIS — G4733 Obstructive sleep apnea (adult) (pediatric): Secondary | ICD-10-CM | POA: Diagnosis not present

## 2023-03-25 DIAGNOSIS — H43393 Other vitreous opacities, bilateral: Secondary | ICD-10-CM | POA: Diagnosis not present

## 2023-03-31 DIAGNOSIS — Z23 Encounter for immunization: Secondary | ICD-10-CM | POA: Diagnosis not present

## 2023-04-05 DIAGNOSIS — L570 Actinic keratosis: Secondary | ICD-10-CM | POA: Diagnosis not present

## 2023-04-06 DIAGNOSIS — H25813 Combined forms of age-related cataract, bilateral: Secondary | ICD-10-CM | POA: Diagnosis not present

## 2023-04-14 ENCOUNTER — Ambulatory Visit (INDEPENDENT_AMBULATORY_CARE_PROVIDER_SITE_OTHER): Payer: Medicare PPO

## 2023-04-14 DIAGNOSIS — I495 Sick sinus syndrome: Secondary | ICD-10-CM | POA: Diagnosis not present

## 2023-04-14 LAB — CUP PACEART REMOTE DEVICE CHECK
Battery Remaining Longevity: 61 mo
Battery Remaining Percentage: 46 %
Battery Voltage: 2.98 V
Brady Statistic AP VP Percent: 1 %
Brady Statistic AP VS Percent: 1 %
Brady Statistic AS VP Percent: 1 %
Brady Statistic AS VS Percent: 99 %
Brady Statistic RA Percent Paced: 1 %
Brady Statistic RV Percent Paced: 1 %
Date Time Interrogation Session: 20241106020013
Implantable Lead Connection Status: 753985
Implantable Lead Connection Status: 753985
Implantable Lead Implant Date: 20171017
Implantable Lead Implant Date: 20171017
Implantable Lead Location: 753859
Implantable Lead Location: 753860
Implantable Pulse Generator Implant Date: 20171017
Lead Channel Impedance Value: 390 Ohm
Lead Channel Impedance Value: 440 Ohm
Lead Channel Pacing Threshold Amplitude: 0.5 V
Lead Channel Pacing Threshold Amplitude: 0.75 V
Lead Channel Pacing Threshold Pulse Width: 0.5 ms
Lead Channel Pacing Threshold Pulse Width: 0.5 ms
Lead Channel Sensing Intrinsic Amplitude: 2.9 mV
Lead Channel Sensing Intrinsic Amplitude: 7.8 mV
Lead Channel Setting Pacing Amplitude: 1.5 V
Lead Channel Setting Pacing Amplitude: 2.5 V
Lead Channel Setting Pacing Pulse Width: 0.5 ms
Lead Channel Setting Sensing Sensitivity: 2 mV
Pulse Gen Model: 2272
Pulse Gen Serial Number: 7946710

## 2023-04-20 DIAGNOSIS — G4733 Obstructive sleep apnea (adult) (pediatric): Secondary | ICD-10-CM | POA: Diagnosis not present

## 2023-04-25 DIAGNOSIS — G4733 Obstructive sleep apnea (adult) (pediatric): Secondary | ICD-10-CM | POA: Diagnosis not present

## 2023-04-29 DIAGNOSIS — H25812 Combined forms of age-related cataract, left eye: Secondary | ICD-10-CM | POA: Diagnosis not present

## 2023-05-04 NOTE — Progress Notes (Signed)
Remote pacemaker transmission.   

## 2023-05-20 DIAGNOSIS — H25811 Combined forms of age-related cataract, right eye: Secondary | ICD-10-CM | POA: Diagnosis not present

## 2023-05-20 DIAGNOSIS — H2511 Age-related nuclear cataract, right eye: Secondary | ICD-10-CM | POA: Diagnosis not present

## 2023-05-20 DIAGNOSIS — G4733 Obstructive sleep apnea (adult) (pediatric): Secondary | ICD-10-CM | POA: Diagnosis not present

## 2023-06-08 DIAGNOSIS — E782 Mixed hyperlipidemia: Secondary | ICD-10-CM | POA: Diagnosis not present

## 2023-06-08 DIAGNOSIS — F0280 Dementia in other diseases classified elsewhere without behavioral disturbance: Secondary | ICD-10-CM | POA: Diagnosis not present

## 2023-06-08 DIAGNOSIS — E1122 Type 2 diabetes mellitus with diabetic chronic kidney disease: Secondary | ICD-10-CM | POA: Diagnosis not present

## 2023-06-20 DIAGNOSIS — G4733 Obstructive sleep apnea (adult) (pediatric): Secondary | ICD-10-CM | POA: Diagnosis not present

## 2023-06-25 DIAGNOSIS — E782 Mixed hyperlipidemia: Secondary | ICD-10-CM | POA: Diagnosis not present

## 2023-06-25 DIAGNOSIS — E119 Type 2 diabetes mellitus without complications: Secondary | ICD-10-CM | POA: Diagnosis not present

## 2023-06-25 DIAGNOSIS — I10 Essential (primary) hypertension: Secondary | ICD-10-CM | POA: Diagnosis not present

## 2023-06-25 DIAGNOSIS — K2100 Gastro-esophageal reflux disease with esophagitis, without bleeding: Secondary | ICD-10-CM | POA: Diagnosis not present

## 2023-06-25 DIAGNOSIS — E7849 Other hyperlipidemia: Secondary | ICD-10-CM | POA: Diagnosis not present

## 2023-07-01 DIAGNOSIS — Z6828 Body mass index (BMI) 28.0-28.9, adult: Secondary | ICD-10-CM | POA: Diagnosis not present

## 2023-07-01 DIAGNOSIS — Z23 Encounter for immunization: Secondary | ICD-10-CM | POA: Diagnosis not present

## 2023-07-01 DIAGNOSIS — I10 Essential (primary) hypertension: Secondary | ICD-10-CM | POA: Diagnosis not present

## 2023-07-01 DIAGNOSIS — G301 Alzheimer's disease with late onset: Secondary | ICD-10-CM | POA: Diagnosis not present

## 2023-07-01 DIAGNOSIS — E1122 Type 2 diabetes mellitus with diabetic chronic kidney disease: Secondary | ICD-10-CM | POA: Diagnosis not present

## 2023-07-01 DIAGNOSIS — E7800 Pure hypercholesterolemia, unspecified: Secondary | ICD-10-CM | POA: Diagnosis not present

## 2023-07-14 ENCOUNTER — Ambulatory Visit: Payer: Medicare PPO

## 2023-07-14 DIAGNOSIS — I495 Sick sinus syndrome: Secondary | ICD-10-CM

## 2023-07-16 ENCOUNTER — Encounter: Payer: Medicare PPO | Admitting: Cardiovascular Disease

## 2023-07-16 LAB — CUP PACEART REMOTE DEVICE CHECK
Battery Remaining Longevity: 58 mo
Battery Remaining Percentage: 44 %
Battery Voltage: 2.98 V
Brady Statistic AP VP Percent: 1 %
Brady Statistic AP VS Percent: 1 %
Brady Statistic AS VP Percent: 1 %
Brady Statistic AS VS Percent: 99 %
Brady Statistic RA Percent Paced: 1 %
Brady Statistic RV Percent Paced: 1 %
Date Time Interrogation Session: 20250206101033
Implantable Lead Connection Status: 753985
Implantable Lead Connection Status: 753985
Implantable Lead Implant Date: 20171017
Implantable Lead Implant Date: 20171017
Implantable Lead Location: 753859
Implantable Lead Location: 753860
Implantable Pulse Generator Implant Date: 20171017
Lead Channel Impedance Value: 380 Ohm
Lead Channel Impedance Value: 430 Ohm
Lead Channel Pacing Threshold Amplitude: 0.5 V
Lead Channel Pacing Threshold Amplitude: 0.75 V
Lead Channel Pacing Threshold Pulse Width: 0.5 ms
Lead Channel Pacing Threshold Pulse Width: 0.5 ms
Lead Channel Sensing Intrinsic Amplitude: 2.2 mV
Lead Channel Sensing Intrinsic Amplitude: 5.9 mV
Lead Channel Setting Pacing Amplitude: 1.5 V
Lead Channel Setting Pacing Amplitude: 2.5 V
Lead Channel Setting Pacing Pulse Width: 0.5 ms
Lead Channel Setting Sensing Sensitivity: 2 mV
Pulse Gen Model: 2272
Pulse Gen Serial Number: 7946710

## 2023-07-21 DIAGNOSIS — G4733 Obstructive sleep apnea (adult) (pediatric): Secondary | ICD-10-CM | POA: Diagnosis not present

## 2023-07-23 ENCOUNTER — Encounter: Payer: Self-pay | Admitting: Cardiovascular Disease

## 2023-07-23 ENCOUNTER — Ambulatory Visit: Payer: Medicare PPO | Attending: Cardiovascular Disease | Admitting: Cardiovascular Disease

## 2023-07-23 VITALS — BP 124/62 | HR 68 | Ht 68.0 in | Wt 194.6 lb

## 2023-07-23 DIAGNOSIS — I2510 Atherosclerotic heart disease of native coronary artery without angina pectoris: Secondary | ICD-10-CM | POA: Diagnosis not present

## 2023-07-23 DIAGNOSIS — I495 Sick sinus syndrome: Secondary | ICD-10-CM

## 2023-07-23 LAB — CUP PACEART INCLINIC DEVICE CHECK
Battery Remaining Longevity: 64 mo
Battery Voltage: 2.98 V
Brady Statistic RA Percent Paced: 0.1 %
Brady Statistic RV Percent Paced: 0.05 %
Date Time Interrogation Session: 20250214114642
Implantable Lead Connection Status: 753985
Implantable Lead Connection Status: 753985
Implantable Lead Implant Date: 20171017
Implantable Lead Implant Date: 20171017
Implantable Lead Location: 753859
Implantable Lead Location: 753860
Implantable Pulse Generator Implant Date: 20171017
Lead Channel Impedance Value: 400 Ohm
Lead Channel Impedance Value: 462.5 Ohm
Lead Channel Pacing Threshold Amplitude: 0.5 V
Lead Channel Pacing Threshold Amplitude: 0.75 V
Lead Channel Pacing Threshold Amplitude: 0.75 V
Lead Channel Pacing Threshold Pulse Width: 0.5 ms
Lead Channel Pacing Threshold Pulse Width: 0.5 ms
Lead Channel Pacing Threshold Pulse Width: 0.5 ms
Lead Channel Sensing Intrinsic Amplitude: 3.7 mV
Lead Channel Sensing Intrinsic Amplitude: 8.1 mV
Lead Channel Setting Pacing Amplitude: 1.5 V
Lead Channel Setting Pacing Amplitude: 2.5 V
Lead Channel Setting Pacing Pulse Width: 0.5 ms
Lead Channel Setting Sensing Sensitivity: 2 mV
Pulse Gen Model: 2272
Pulse Gen Serial Number: 7946710

## 2023-07-23 NOTE — Progress Notes (Signed)
    PCP: Estanislado Pandy, MD Primary Cardiologist: Dr Anne Fu Primary EP:  Dr Johney Frame  Joel Nelson is a 89 y.o. male who presents today for routine electrophysiology followup.  Since last being seen in our clinic, the patient reports doing very well.    Today, he denies symptoms of palpitations, chest pain, shortness of breath,  lower extremity edema, dizziness, presyncope, or syncope.  The patient is otherwise without complaint today.        Physical Exam: Vitals:   07/23/23 1139  BP: 124/62  Pulse: 68  SpO2: 95%  Weight: 194 lb 9.6 oz (88.3 kg)  Height: 5\' 8"  (1.727 m)    Gen: Appears comfortable, well-nourished CV: RRR, no dependent edema The device site is normal -- no tenderness, edema, drainage, redness, threatened erosion. Pulm: breathing easily   Pacemaker interrogation- reviewed in detail today,  See PACEART report   ECG today shows normal rhythm  Assessment and Plan:  1. Symptomatic sinus bradycardia  Normal pacemaker function See Pace Art report No changes today he is not device dependant today  2. HTN Stable No change required today  3. OSA Follows with Dr Mayford Knife  4. CAD s/p PTCA of LCx in 1995 No ischemic symptoms  Return in a year  Maurice Small, MD 07/23/2023 11:48 AM

## 2023-07-23 NOTE — Patient Instructions (Addendum)

## 2023-08-18 DIAGNOSIS — G4733 Obstructive sleep apnea (adult) (pediatric): Secondary | ICD-10-CM | POA: Diagnosis not present

## 2023-08-20 NOTE — Addendum Note (Signed)
 Addended by: Elease Etienne A on: 08/20/2023 11:20 AM   Modules accepted: Orders

## 2023-08-20 NOTE — Progress Notes (Signed)
 Remote pacemaker transmission.

## 2023-08-23 ENCOUNTER — Other Ambulatory Visit: Payer: Self-pay

## 2023-08-23 ENCOUNTER — Emergency Department (HOSPITAL_COMMUNITY)

## 2023-08-23 ENCOUNTER — Telehealth: Payer: Self-pay | Admitting: *Deleted

## 2023-08-23 ENCOUNTER — Encounter (HOSPITAL_COMMUNITY): Payer: Self-pay

## 2023-08-23 ENCOUNTER — Emergency Department (HOSPITAL_COMMUNITY)
Admission: EM | Admit: 2023-08-23 | Discharge: 2023-08-23 | Disposition: A | Discharge status: Home / Self Care | Attending: Emergency Medicine | Admitting: Emergency Medicine

## 2023-08-23 DIAGNOSIS — S022XXA Fracture of nasal bones, initial encounter for closed fracture: Secondary | ICD-10-CM | POA: Diagnosis not present

## 2023-08-23 DIAGNOSIS — W19XXXA Unspecified fall, initial encounter: Secondary | ICD-10-CM | POA: Diagnosis not present

## 2023-08-23 DIAGNOSIS — I2510 Atherosclerotic heart disease of native coronary artery without angina pectoris: Secondary | ICD-10-CM | POA: Insufficient documentation

## 2023-08-23 DIAGNOSIS — F0390 Unspecified dementia without behavioral disturbance: Secondary | ICD-10-CM | POA: Insufficient documentation

## 2023-08-23 DIAGNOSIS — Y92481 Parking lot as the place of occurrence of the external cause: Secondary | ICD-10-CM | POA: Diagnosis not present

## 2023-08-23 DIAGNOSIS — Z7982 Long term (current) use of aspirin: Secondary | ICD-10-CM | POA: Insufficient documentation

## 2023-08-23 DIAGNOSIS — I10 Essential (primary) hypertension: Secondary | ICD-10-CM | POA: Insufficient documentation

## 2023-08-23 DIAGNOSIS — S80211A Abrasion, right knee, initial encounter: Secondary | ICD-10-CM | POA: Diagnosis not present

## 2023-08-23 DIAGNOSIS — W1830XA Fall on same level, unspecified, initial encounter: Secondary | ICD-10-CM | POA: Diagnosis not present

## 2023-08-23 DIAGNOSIS — S0992XA Unspecified injury of nose, initial encounter: Secondary | ICD-10-CM | POA: Diagnosis present

## 2023-08-23 DIAGNOSIS — Z23 Encounter for immunization: Secondary | ICD-10-CM | POA: Insufficient documentation

## 2023-08-23 DIAGNOSIS — R58 Hemorrhage, not elsewhere classified: Secondary | ICD-10-CM | POA: Diagnosis not present

## 2023-08-23 DIAGNOSIS — Z79899 Other long term (current) drug therapy: Secondary | ICD-10-CM | POA: Insufficient documentation

## 2023-08-23 DIAGNOSIS — S80212A Abrasion, left knee, initial encounter: Secondary | ICD-10-CM | POA: Insufficient documentation

## 2023-08-23 LAB — URINALYSIS, ROUTINE W REFLEX MICROSCOPIC
Bacteria, UA: NONE SEEN
Bilirubin Urine: NEGATIVE
Glucose, UA: NEGATIVE mg/dL
Ketones, ur: NEGATIVE mg/dL
Leukocytes,Ua: NEGATIVE
Nitrite: NEGATIVE
Protein, ur: NEGATIVE mg/dL
Specific Gravity, Urine: 1.008 (ref 1.005–1.030)
pH: 7 (ref 5.0–8.0)

## 2023-08-23 LAB — I-STAT CHEM 8, ED
BUN: 13 mg/dL (ref 8–23)
Calcium, Ion: 1.12 mmol/L — ABNORMAL LOW (ref 1.15–1.40)
Chloride: 104 mmol/L (ref 98–111)
Creatinine, Ser: 0.9 mg/dL (ref 0.61–1.24)
Glucose, Bld: 151 mg/dL — ABNORMAL HIGH (ref 70–99)
HCT: 48 % (ref 39.0–52.0)
Hemoglobin: 16.3 g/dL (ref 13.0–17.0)
Potassium: 4.7 mmol/L (ref 3.5–5.1)
Sodium: 137 mmol/L (ref 135–145)
TCO2: 27 mmol/L (ref 22–32)

## 2023-08-23 MED ORDER — TETANUS-DIPHTH-ACELL PERTUSSIS 5-2.5-18.5 LF-MCG/0.5 IM SUSY
0.5000 mL | PREFILLED_SYRINGE | Freq: Once | INTRAMUSCULAR | Status: AC
  Administered 2023-08-23: 0.5 mL via INTRAMUSCULAR
  Filled 2023-08-23: qty 0.5

## 2023-08-23 MED ORDER — ACETAMINOPHEN 500 MG PO TABS
1000.0000 mg | ORAL_TABLET | Freq: Once | ORAL | Status: AC
  Administered 2023-08-23: 1000 mg via ORAL
  Filled 2023-08-23: qty 2

## 2023-08-23 MED ORDER — CEPHALEXIN 500 MG PO CAPS: 500.0000 mg | ORAL_CAPSULE | Freq: Four times a day (QID) | ORAL | 0 refills | Status: DC

## 2023-08-23 MED ORDER — ACETAMINOPHEN 500 MG PO TABS: 500.0000 mg | ORAL_TABLET | Freq: Four times a day (QID) | ORAL | 0 refills | Status: AC | PRN

## 2023-08-23 MED ORDER — LIDOCAINE-EPINEPHRINE-TETRACAINE (LET) TOPICAL GEL
3.0000 mL | Freq: Once | TOPICAL | Status: AC
  Administered 2023-08-23: 3 mL via TOPICAL
  Filled 2023-08-23: qty 3

## 2023-08-23 NOTE — Discharge Instructions (Addendum)
 You have been diagnosed with broken bones involving your nose.  Please take Tylenol and antibiotic as prescribed.  Avoid blowing your nose.  Follow-up with your doctor for further care.

## 2023-08-23 NOTE — ED Notes (Addendum)
 Pts family advised staff that the pt is becoming increasingly agitated and grabbing at his C-Collar. This EMT advised family the collar must stay in place until imaging results are reviewed.

## 2023-08-23 NOTE — Telephone Encounter (Signed)
 Pt is not longer able to travel to the [place] office for his care and needs to transfer to [place] office.  Would like to transfer from Dr Anne Fu to Dr Diona Browner.  Pt is currently in [facility] ED after a fall in the parking lot,  attempting to get to appt today.   Will send to both MDs for approval.

## 2023-08-23 NOTE — ED Provider Notes (Signed)
 [place] EMERGENCY DEPARTMENT AT [facility] Provider Note   CSN: [id number] Arrival date & time: 08/23/23  1118     History  Chief Complaint  Patient presents with   Rece Zechman is a 89 y.o. male.  The history is provided by the patient, the spouse, the EMS personnel and medical records. No language interpreter was used.  Fall    89 year old male history of hypertension, hyperlipidemia, CAD, dementia, sick sinus syndrome brought here via EMS from a parking lot for evaluation of a fall.  Most of history obtained through wife who is at bedside.  Patient was accompanied by his wife to a regular cardiology visit today when he apparently tripped over a curb and fell forward striking his face against the ground.  No loss of consciousness.  His wife and some staff was able to help him up and patient brought here for further evaluation.  He did suffer facial injury and did strike both of his knees against the ground.  He is without any complaint.  He has been doing fine leading up to this event.  No recent medication changes.  Aside from aspirin patient is not on any blood thinning medication.  He does not endorse any precipitating symptoms prior to the fall and denies any chest pain or trouble breathing no abdominal pain or urinary symptom.  Does have history of vascular dementia and is a poor historian.  Home Medications Prior to Admission medications   Medication Sig Start Date End Date Taking? Authorizing Provider  aspirin EC 81 MG tablet Take 162 mg by mouth in the morning and at bedtime.    [provider]  Cyanocobalamin 1000 MCG TBCR Take 1 tablet by mouth daily.    [provider]  ezetimibe (ZETIA) 10 MG tablet Take 10 mg by mouth daily. 12/01/15   [provider]  fluticasone (FLONASE) 50 MCG/ACT nasal spray Place 1 spray into both nostrils daily as needed for allergies.    [provider]  losartan (COZAAR) 25 MG tablet Take  25 mg by mouth daily. 12/22/13   [provider]  memantine (NAMENDA) 10 MG tablet Take 10 mg by mouth daily.    [provider]  Multiple Vitamin (MULTI-VITAMINS) TABS Take 1 tablet by mouth daily.    [provider]  nitroGLYCERIN (NITROSTAT) 0.4 MG SL tablet Place 0.4 mg under the tongue every 5 (five) minutes as needed for chest pain.  08/11/10   [provider]  rivastigmine (EXELON) 13.3 MG/24HR Place 13.3 mg onto the skin daily. 04/16/20   [provider]  rosuvastatin (CRESTOR) 5 MG tablet Take 5 mg by mouth daily.    [provider]  sertraline (ZOLOFT) 50 MG tablet Take 25 mg by mouth at bedtime.    [provider]  tamsulosin (FLOMAX) 0.4 MG CAPS capsule Take 0.4 mg by mouth daily. 11/21/15   [provider]      Allergies    Patient has no known allergies.    Review of Systems   Review of Systems  All other systems reviewed and are negative.   Physical Exam Updated Vital Signs BP (!) 169/82   Pulse 63   Temp (!) 97.4 F (36.3 C) (Oral)   Resp 18   Ht 5\' 8"  (1.727 m)   Wt 68 kg   SpO2 96%   BMI 22.81 kg/m  Physical Exam Vitals and nursing note reviewed.  Constitutional:  General: He is not in acute distress.    Appearance: He is well-developed.  HENT:     Head: Normocephalic.     Comments: Small skin avulsion noted to the bridge of the nose with some dried blood but not actively bleeding.  Dried blood noted in the nares as well.  Area is mildly tender to palpation no septal hematoma and no malocclusion.  No significant midface tenderness.  Abrasion noted to mid forehead. Eyes:     Extraocular Movements: Extraocular movements intact.     Conjunctiva/sclera: Conjunctivae normal.     Pupils: Pupils are equal, round, and reactive to light.  Neck:     Comments: Neck and c-collar.  No significant tenderness to palpation about the midline cervical spine.  No step-off Cardiovascular:     Rate and  Rhythm: Normal rate and regular rhythm.     Pulses: Normal pulses.     Heart sounds: Normal heart sounds.  Pulmonary:     Effort: Pulmonary effort is normal.     Breath sounds: Normal breath sounds.  Abdominal:     Palpations: Abdomen is soft.     Tenderness: There is no abdominal tenderness.  Musculoskeletal:     Cervical back: Neck supple.     Comments: Skin abrasion noted to bilateral knee anteriorly with normal flexion extension.  Patella is located.  Minimal tenderness to palpation.  Skin:    Findings: No rash.  Neurological:     Mental Status: He is alert. Mental status is at baseline.    ED Results / Procedures / Treatments   Labs (all labs ordered are listed, but only abnormal results are displayed) Labs Reviewed  URINALYSIS, ROUTINE W REFLEX MICROSCOPIC - Abnormal; Notable for the following components:      Result Value   Hgb urine dipstick SMALL (*)    All other components within normal limits  I-STAT CHEM 8, ED - Abnormal; Notable for the following components:   Glucose, Bld 151 (*)    Calcium, Ion 1.12 (*)    All other components within normal limits    EKG EKG Interpretation Date/Time:  Monday August 23 2023 12:21:21 EDT Ventricular Rate:  60 PR Interval:  186 QRS Duration:  92 QT Interval:  386 QTC Calculation: 386 R Axis:   76  Text Interpretation: Normal sinus rhythm Normal ECG No significant change since last tracing Confirmed by Elayne Snare ([id number]) on 08/23/2023 1:04:07 PM  Radiology CT Head Wo Contrast Result Date: 08/23/2023 CLINICAL DATA:  Blunt facial trauma EXAM: CT HEAD WITHOUT CONTRAST CT MAXILLOFACIAL WITHOUT CONTRAST CT CERVICAL SPINE WITHOUT CONTRAST TECHNIQUE: Multidetector CT imaging of the head, cervical spine, and maxillofacial structures were performed using the standard protocol without intravenous contrast. Multiplanar CT image reconstructions of the cervical spine and maxillofacial structures were also generated. RADIATION DOSE  REDUCTION: This exam was performed according to the departmental dose-optimization program which includes automated exposure control, adjustment of the mA and/or kV according to patient size and/or use of iterative reconstruction technique. COMPARISON:  03/06/2023 FINDINGS: CT HEAD FINDINGS Brain: No evidence of acute infarction, hemorrhage, hydrocephalus, extra-axial collection or mass lesion/mass effect. Periventricular white matter hypodensity. Vascular: No hyperdense vessel or unexpected calcification. CT FACIAL BONES FINDINGS Skull: Normal. Negative for fracture or focal lesion. Facial bones: Minimally displaced nasal bone fractures, of uncertain acuity (series 4, image 24) no other displaced fractures or dislocations. Sinuses/Orbits: No acute finding. Other: Soft tissue contusion of the midline forehead and nose (series 3, image 16). CT CERVICAL  SPINE FINDINGS Alignment: Degenerative straightening and reversal of the normal cervical lordosis. Skull base and vertebrae: No acute fracture. No primary bone lesion or focal pathologic process. Soft tissues and spinal canal: No prevertebral fluid or swelling. No visible canal hematoma. Disc levels: Focally severe disc degenerative disease from C5-C7 with otherwise relatively preserved disc spaces. Upper chest: Negative. Other: None. IMPRESSION: 1. No acute intracranial pathology. 2. Minimally displaced nasal bone fractures, of uncertain acuity. No other displaced fractures or dislocations of the facial bones. 3. Soft tissue contusion of the midline forehead and nose. 4. No fracture or static subluxation of the cervical spine. 5. Focally severe disc degenerative disease from C5-C7 with otherwise relatively preserved disc spaces. Electronically Signed   By: Jearld Lesch M.D.   On: 08/23/2023 14:20   CT Maxillofacial Wo Contrast Result Date: 08/23/2023 CLINICAL DATA:  Blunt facial trauma EXAM: CT HEAD WITHOUT CONTRAST CT MAXILLOFACIAL WITHOUT CONTRAST CT CERVICAL  SPINE WITHOUT CONTRAST TECHNIQUE: Multidetector CT imaging of the head, cervical spine, and maxillofacial structures were performed using the standard protocol without intravenous contrast. Multiplanar CT image reconstructions of the cervical spine and maxillofacial structures were also generated. RADIATION DOSE REDUCTION: This exam was performed according to the departmental dose-optimization program which includes automated exposure control, adjustment of the mA and/or kV according to patient size and/or use of iterative reconstruction technique. COMPARISON:  03/06/2023 FINDINGS: CT HEAD FINDINGS Brain: No evidence of acute infarction, hemorrhage, hydrocephalus, extra-axial collection or mass lesion/mass effect. Periventricular white matter hypodensity. Vascular: No hyperdense vessel or unexpected calcification. CT FACIAL BONES FINDINGS Skull: Normal. Negative for fracture or focal lesion. Facial bones: Minimally displaced nasal bone fractures, of uncertain acuity (series 4, image 24) no other displaced fractures or dislocations. Sinuses/Orbits: No acute finding. Other: Soft tissue contusion of the midline forehead and nose (series 3, image 16). CT CERVICAL SPINE FINDINGS Alignment: Degenerative straightening and reversal of the normal cervical lordosis. Skull base and vertebrae: No acute fracture. No primary bone lesion or focal pathologic process. Soft tissues and spinal canal: No prevertebral fluid or swelling. No visible canal hematoma. Disc levels: Focally severe disc degenerative disease from C5-C7 with otherwise relatively preserved disc spaces. Upper chest: Negative. Other: None. IMPRESSION: 1. No acute intracranial pathology. 2. Minimally displaced nasal bone fractures, of uncertain acuity. No other displaced fractures or dislocations of the facial bones. 3. Soft tissue contusion of the midline forehead and nose. 4. No fracture or static subluxation of the cervical spine. 5. Focally severe disc  degenerative disease from C5-C7 with otherwise relatively preserved disc spaces. Electronically Signed   By: Jearld Lesch M.D.   On: 08/23/2023 14:20   CT Cervical Spine Wo Contrast Result Date: 08/23/2023 CLINICAL DATA:  Blunt facial trauma EXAM: CT HEAD WITHOUT CONTRAST CT MAXILLOFACIAL WITHOUT CONTRAST CT CERVICAL SPINE WITHOUT CONTRAST TECHNIQUE: Multidetector CT imaging of the head, cervical spine, and maxillofacial structures were performed using the standard protocol without intravenous contrast. Multiplanar CT image reconstructions of the cervical spine and maxillofacial structures were also generated. RADIATION DOSE REDUCTION: This exam was performed according to the departmental dose-optimization program which includes automated exposure control, adjustment of the mA and/or kV according to patient size and/or use of iterative reconstruction technique. COMPARISON:  03/06/2023 FINDINGS: CT HEAD FINDINGS Brain: No evidence of acute infarction, hemorrhage, hydrocephalus, extra-axial collection or mass lesion/mass effect. Periventricular white matter hypodensity. Vascular: No hyperdense vessel or unexpected calcification. CT FACIAL BONES FINDINGS Skull: Normal. Negative for fracture or focal lesion. Facial bones: Minimally displaced nasal  bone fractures, of uncertain acuity (series 4, image 24) no other displaced fractures or dislocations. Sinuses/Orbits: No acute finding. Other: Soft tissue contusion of the midline forehead and nose (series 3, image 16). CT CERVICAL SPINE FINDINGS Alignment: Degenerative straightening and reversal of the normal cervical lordosis. Skull base and vertebrae: No acute fracture. No primary bone lesion or focal pathologic process. Soft tissues and spinal canal: No prevertebral fluid or swelling. No visible canal hematoma. Disc levels: Focally severe disc degenerative disease from C5-C7 with otherwise relatively preserved disc spaces. Upper chest: Negative. Other: None.  IMPRESSION: 1. No acute intracranial pathology. 2. Minimally displaced nasal bone fractures, of uncertain acuity. No other displaced fractures or dislocations of the facial bones. 3. Soft tissue contusion of the midline forehead and nose. 4. No fracture or static subluxation of the cervical spine. 5. Focally severe disc degenerative disease from C5-C7 with otherwise relatively preserved disc spaces. Electronically Signed   By: Jearld Lesch M.D.   On: 08/23/2023 14:20    Procedures Procedures    Medications Ordered in ED Medications  Tdap (BOOSTRIX) injection 0.5 mL (0.5 mLs Intramuscular Given 08/23/23 1213)  acetaminophen (TYLENOL) tablet 1,000 mg (1,000 mg Oral Given 08/23/23 1213)  lidocaine-EPINEPHrine-tetracaine (LET) topical gel (3 mLs Topical Given 08/23/23 1303)    ED Course/ Medical Decision Making/ A&P                                 Medical Decision Making Amount and/or Complexity of Data Reviewed Labs: ordered. Radiology: ordered. ECG/medicine tests: ordered.  Risk OTC drugs. Prescription drug management.   BP (!) 169/82   Pulse 63   Temp (!) 97.4 F (36.3 C) (Oral)   Resp 18   Ht 5\' 8"  (1.727 m)   Wt 68 kg   SpO2 96%   BMI 22.81 kg/m   11:51 AM Pt is an 89 y.o. male with a PMH of CAD, sick sinus syndrome and dementia brought to the ED via EMS for a fall. Pt is currently accompanied by his wife who acts as a historian. Pt and wife state that they were walking in a parking lot on the side walk when the patient lost his foot stepping off the curb causing him to fall face forward. Pt states that he hit his face and knees on the ground and which resulted in epistaxis, a facial laceration and abrasions to the face and knees. Pt and wife both deny LOC. Pt denies dizziness, lightheadedness, chest pain or palpitations prior to the fall. Pt currently takes two baby aspirins daily but he is not on any other form of anti-coag. Pt endorses pain at the site of injury, on his  nose but denies any other pain in his head. Pt is currently oriented to self only but wife states that this is his mentation at baseline.  Exam notable for skin tear to the bridge of nose, ecchymosis to forehead, and abrasion to bilateral knees anteriorly.  Will obtain appropriate imaging, workup initiated.  -Labs ordered, independently viewed and interpreted by me.  Labs remarkable for mild elevated CBG of 151 otherwise electrolyte panels are reassuring UA without signs of urinary tract infection -The patient was maintained on a cardiac monitor.  I personally viewed and interpreted the cardiac monitored which showed an underlying rhythm of: Normal sinus rhythm -Imaging independently viewed and interpreted by me and I agree with radiologist's interpretation.  Result remarkable for CT scan of  the head, C-spine, and max of facial demonstrate evidence of nasal bone fracture. -This patient presents to the ED for concern of fall, this involves an extensive number of treatment options, and is a complaint that carries with it a high risk of complications and morbidity.  The differential diagnosis includes fracture, dislocation, contusion, abrasions, laceration, intracranial injury -Co morbidities that complicate the patient evaluation includes dementia -Treatment includes Tylenol, wound care, -Reevaluation of the patient after these medicines showed that the patient improved -PCP office notes or outside notes reviewed -Discussion with attending Dr. Theresia Lo -Escalation to admission/observation considered: patients feels much better, is comfortable with discharge, and will follow up with PCP -Prescription medication considered, patient comfortable with Tylenol -Social Determinant of Health considered         Final Clinical Impression(s) / ED Diagnoses Final diagnoses:  Closed displaced fracture of nasal bone, initial encounter  Fall, initial encounter  Abrasion of both knees    Rx / DC  Orders ED Discharge Orders          Ordered    acetaminophen (TYLENOL) 500 MG tablet  Every 6 hours PRN        08/23/23 1435    cephALEXin (KEFLEX) 500 MG capsule  4 times daily        08/23/23 1435              Fayrene Helper, PA-C 08/23/23 1436    Elayne Snare K, DO 08/23/23 1522

## 2023-08-23 NOTE — ED Notes (Signed)
 Patient transported to CT

## 2023-08-23 NOTE — ED Triage Notes (Signed)
 Pt presents to ED via EMS fall in parking lot, no LOC. Lac to upper nose, controlled bleeding. No thinners. Hx of dementia. Alert and oriented x4. C-collar in place.

## 2023-08-24 ENCOUNTER — Ambulatory Visit: Payer: Medicare PPO | Admitting: Cardiology

## 2023-08-26 DIAGNOSIS — S022XXA Fracture of nasal bones, initial encounter for closed fracture: Secondary | ICD-10-CM | POA: Diagnosis not present

## 2023-08-26 DIAGNOSIS — Z6829 Body mass index (BMI) 29.0-29.9, adult: Secondary | ICD-10-CM | POA: Diagnosis not present

## 2023-08-30 ENCOUNTER — Ambulatory Visit: Attending: Cardiology | Admitting: Cardiology

## 2023-08-30 ENCOUNTER — Encounter: Payer: Self-pay | Admitting: Cardiology

## 2023-08-30 VITALS — BP 136/84 | HR 68 | Ht 68.5 in | Wt 193.2 lb

## 2023-08-30 DIAGNOSIS — I25118 Atherosclerotic heart disease of native coronary artery with other forms of angina pectoris: Secondary | ICD-10-CM

## 2023-08-30 DIAGNOSIS — I10 Essential (primary) hypertension: Secondary | ICD-10-CM | POA: Diagnosis not present

## 2023-08-30 DIAGNOSIS — I495 Sick sinus syndrome: Secondary | ICD-10-CM

## 2023-08-30 DIAGNOSIS — E782 Mixed hyperlipidemia: Secondary | ICD-10-CM

## 2023-08-30 NOTE — Progress Notes (Signed)
    Cardiology Office Note  Date: 08/30/2023   ID: Ender Rorke, DOB 1933-07-27, MRN [id number]  History of Present Illness: Joel Nelson is an 89 y.o. male former patient of Dr. Anne Fu now presenting to establish follow-up with me.  I reviewed the chart.  He is here today with his wife.  He does not report any angina or nitroglycerin use over the last year.  No palpitations or syncope.  He did have a mechanical fall while walking in a parking lot with his wife in [place] recently, did sustain a minimally displaced nasal bone fracture and some soft tissue contusion.  He was evaluated in the ER.  We went over his medications today which are stable from a cardiac perspective.  He continues to follow with Dr. Neita Carp at [facility] for routine lab work.  St. Jude pacemaker in place with follow-up by Dr. Nelly Laurence.  Device check in February revealed normal function.  I reviewed his recent ECG.  Physical Exam: VS:  BP 136/84   Pulse 68   Ht 5' 8.5" (1.74 m)   Wt 193 lb 3.2 oz (87.6 kg)   SpO2 95%   BMI 28.95 kg/m , BMI Body mass index is 28.95 kg/m.  Wt Readings from Last 3 Encounters:  08/30/23 193 lb 3.2 oz (87.6 kg)  08/23/23 150 lb (68 kg)  07/23/23 194 lb 9.6 oz (88.3 kg)    General: Patient appears comfortable at rest. HEENT: Conjunctiva normal, resolving facial ecchymoses. Neck: Supple, no elevated JVP or carotid bruits. Lungs: Clear to auscultation, nonlabored breathing at rest. Cardiac: Regular rate and rhythm, no S3 or significant systolic murmur, no pericardial rub. Extremities: No pitting edema.  ECG:  An ECG dated 08/23/2023 was personally reviewed today and demonstrated:  Sinus rhythm.  Labwork: August 2023: Cholesterol 114, triglycerides 216, HDL 34, LDL 45 08/23/2023: BUN 13; Creatinine, Ser 0.90; Hemoglobin 16.3; Potassium 4.7; Sodium 137   Other Studies Reviewed Today:  No interval cardiac testing for review today.  Assessment and Plan:  1.  CAD status post  PTCA of the circumflex in 1995.  LVEF 55 to 60% by echocardiogram in 2017.  He reports no angina and has not used nitroglycerin in the interim.  I reviewed his recent ECG.  Plan to continue observation at this time.  Currently on aspirin 81 mg daily, Zetia 10 mg daily, Crestor 5 mg daily, and as needed nitroglycerin.  2.  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 Nelly Laurence.   3.  OSA on BiPAP with follow-up by Dr. Mayford Knife.  4.  Primary hypertension.  No change in current regimen based on today's measurement.  Continue Cozaar 25 mg daily.  5.  Mixed hyperlipidemia.  Last LDL 45 on Crestor 5 mg daily and Zetia 10 mg daily.  Disposition:  Follow up  6 months in the [place] office.  Signed, Jonelle Sidle, M.D., F.A.C.C. [facility]

## 2023-08-30 NOTE — Patient Instructions (Signed)
 Medication Instructions:  Your physician recommends that you continue on your current medications as directed. Please refer to the Current Medication list given to you today.   Labwork: None today  Testing/Procedures: None today  Follow-Up: 6 months in [place] with Dr.McDowell  Any Other Special Instructions Will Be Listed Below (If Applicable).  If you need a refill on your cardiac medications before your next appointment, please call your pharmacy.

## 2023-09-09 DIAGNOSIS — H43393 Other vitreous opacities, bilateral: Secondary | ICD-10-CM | POA: Diagnosis not present

## 2023-09-14 DIAGNOSIS — H90A22 Sensorineural hearing loss, unilateral, left ear, with restricted hearing on the contralateral side: Secondary | ICD-10-CM | POA: Diagnosis not present

## 2023-09-14 DIAGNOSIS — H90A31 Mixed conductive and sensorineural hearing loss, unilateral, right ear with restricted hearing on the contralateral side: Secondary | ICD-10-CM | POA: Diagnosis not present

## 2023-09-15 DIAGNOSIS — S022XXD Fracture of nasal bones, subsequent encounter for fracture with routine healing: Secondary | ICD-10-CM | POA: Diagnosis not present

## 2023-09-15 DIAGNOSIS — G4733 Obstructive sleep apnea (adult) (pediatric): Secondary | ICD-10-CM | POA: Diagnosis not present

## 2023-09-17 DIAGNOSIS — G4733 Obstructive sleep apnea (adult) (pediatric): Secondary | ICD-10-CM | POA: Diagnosis not present

## 2023-09-18 ENCOUNTER — Inpatient Hospital Stay (HOSPITAL_COMMUNITY)
Admission: EM | Admit: 2023-09-18 | Discharge: 2023-09-25 | DRG: 418 | Disposition: A | Discharge status: Home / Self Care | Attending: Internal Medicine | Admitting: Internal Medicine

## 2023-09-18 ENCOUNTER — Encounter (HOSPITAL_COMMUNITY): Payer: Self-pay | Admitting: *Deleted

## 2023-09-18 ENCOUNTER — Other Ambulatory Visit: Payer: Self-pay

## 2023-09-18 ENCOUNTER — Emergency Department (HOSPITAL_COMMUNITY)

## 2023-09-18 DIAGNOSIS — I2510 Atherosclerotic heart disease of native coronary artery without angina pectoris: Secondary | ICD-10-CM | POA: Diagnosis not present

## 2023-09-18 DIAGNOSIS — G4733 Obstructive sleep apnea (adult) (pediatric): Secondary | ICD-10-CM | POA: Diagnosis not present

## 2023-09-18 DIAGNOSIS — B370 Candidal stomatitis: Secondary | ICD-10-CM | POA: Diagnosis present

## 2023-09-18 DIAGNOSIS — K858 Other acute pancreatitis without necrosis or infection: Secondary | ICD-10-CM | POA: Diagnosis present

## 2023-09-18 DIAGNOSIS — Z8619 Personal history of other infectious and parasitic diseases: Secondary | ICD-10-CM

## 2023-09-18 DIAGNOSIS — Z87442 Personal history of urinary calculi: Secondary | ICD-10-CM | POA: Diagnosis not present

## 2023-09-18 DIAGNOSIS — R0789 Other chest pain: Secondary | ICD-10-CM | POA: Diagnosis present

## 2023-09-18 DIAGNOSIS — K851 Biliary acute pancreatitis without necrosis or infection: Principal | ICD-10-CM

## 2023-09-18 DIAGNOSIS — K8020 Calculus of gallbladder without cholecystitis without obstruction: Secondary | ICD-10-CM | POA: Diagnosis not present

## 2023-09-18 DIAGNOSIS — I358 Other nonrheumatic aortic valve disorders: Secondary | ICD-10-CM | POA: Diagnosis present

## 2023-09-18 DIAGNOSIS — Z9049 Acquired absence of other specified parts of digestive tract: Secondary | ICD-10-CM | POA: Diagnosis not present

## 2023-09-18 DIAGNOSIS — R109 Unspecified abdominal pain: Secondary | ICD-10-CM | POA: Diagnosis present

## 2023-09-18 DIAGNOSIS — K219 Gastro-esophageal reflux disease without esophagitis: Secondary | ICD-10-CM | POA: Diagnosis present

## 2023-09-18 DIAGNOSIS — K828 Other specified diseases of gallbladder: Secondary | ICD-10-CM | POA: Diagnosis present

## 2023-09-18 DIAGNOSIS — R609 Edema, unspecified: Secondary | ICD-10-CM | POA: Diagnosis present

## 2023-09-18 DIAGNOSIS — Z01810 Encounter for preprocedural cardiovascular examination: Secondary | ICD-10-CM | POA: Diagnosis not present

## 2023-09-18 DIAGNOSIS — K8012 Calculus of gallbladder with acute and chronic cholecystitis without obstruction: Secondary | ICD-10-CM | POA: Diagnosis not present

## 2023-09-18 DIAGNOSIS — K760 Fatty (change of) liver, not elsewhere classified: Secondary | ICD-10-CM | POA: Diagnosis not present

## 2023-09-18 DIAGNOSIS — Z79899 Other long term (current) drug therapy: Secondary | ICD-10-CM

## 2023-09-18 DIAGNOSIS — K5730 Diverticulosis of large intestine without perforation or abscess without bleeding: Secondary | ICD-10-CM | POA: Diagnosis not present

## 2023-09-18 DIAGNOSIS — R748 Abnormal levels of other serum enzymes: Secondary | ICD-10-CM

## 2023-09-18 DIAGNOSIS — Z9861 Coronary angioplasty status: Secondary | ICD-10-CM | POA: Diagnosis not present

## 2023-09-18 DIAGNOSIS — I495 Sick sinus syndrome: Secondary | ICD-10-CM | POA: Diagnosis not present

## 2023-09-18 DIAGNOSIS — N400 Enlarged prostate without lower urinary tract symptoms: Secondary | ICD-10-CM | POA: Diagnosis present

## 2023-09-18 DIAGNOSIS — K838 Other specified diseases of biliary tract: Secondary | ICD-10-CM | POA: Diagnosis not present

## 2023-09-18 DIAGNOSIS — I10 Essential (primary) hypertension: Secondary | ICD-10-CM | POA: Diagnosis present

## 2023-09-18 DIAGNOSIS — Z85820 Personal history of malignant melanoma of skin: Secondary | ICD-10-CM

## 2023-09-18 DIAGNOSIS — K863 Pseudocyst of pancreas: Secondary | ICD-10-CM | POA: Diagnosis not present

## 2023-09-18 DIAGNOSIS — R7303 Prediabetes: Secondary | ICD-10-CM | POA: Diagnosis present

## 2023-09-18 DIAGNOSIS — K859 Acute pancreatitis without necrosis or infection, unspecified: Principal | ICD-10-CM | POA: Diagnosis present

## 2023-09-18 DIAGNOSIS — Z950 Presence of cardiac pacemaker: Secondary | ICD-10-CM | POA: Diagnosis not present

## 2023-09-18 DIAGNOSIS — K8000 Calculus of gallbladder with acute cholecystitis without obstruction: Secondary | ICD-10-CM | POA: Diagnosis not present

## 2023-09-18 DIAGNOSIS — K862 Cyst of pancreas: Secondary | ICD-10-CM | POA: Diagnosis not present

## 2023-09-18 DIAGNOSIS — E785 Hyperlipidemia, unspecified: Secondary | ICD-10-CM | POA: Diagnosis present

## 2023-09-18 DIAGNOSIS — G4731 Primary central sleep apnea: Secondary | ICD-10-CM | POA: Diagnosis present

## 2023-09-18 DIAGNOSIS — K861 Other chronic pancreatitis: Secondary | ICD-10-CM | POA: Diagnosis not present

## 2023-09-18 DIAGNOSIS — K5792 Diverticulitis of intestine, part unspecified, without perforation or abscess without bleeding: Secondary | ICD-10-CM | POA: Diagnosis not present

## 2023-09-18 DIAGNOSIS — D72829 Elevated white blood cell count, unspecified: Secondary | ICD-10-CM | POA: Diagnosis not present

## 2023-09-18 DIAGNOSIS — R079 Chest pain, unspecified: Secondary | ICD-10-CM | POA: Diagnosis not present

## 2023-09-18 DIAGNOSIS — R7989 Other specified abnormal findings of blood chemistry: Secondary | ICD-10-CM | POA: Diagnosis not present

## 2023-09-18 DIAGNOSIS — I700 Atherosclerosis of aorta: Secondary | ICD-10-CM | POA: Diagnosis not present

## 2023-09-18 DIAGNOSIS — F0390 Unspecified dementia without behavioral disturbance: Secondary | ICD-10-CM | POA: Diagnosis not present

## 2023-09-18 DIAGNOSIS — Z9181 History of falling: Secondary | ICD-10-CM

## 2023-09-18 DIAGNOSIS — Z01818 Encounter for other preprocedural examination: Secondary | ICD-10-CM | POA: Diagnosis not present

## 2023-09-18 DIAGNOSIS — Z7982 Long term (current) use of aspirin: Secondary | ICD-10-CM

## 2023-09-18 DIAGNOSIS — R140 Abdominal distension (gaseous): Secondary | ICD-10-CM | POA: Diagnosis not present

## 2023-09-18 LAB — LIPID PANEL
Cholesterol: 104 mg/dL (ref 0–200)
HDL: 48 mg/dL (ref >40)
LDL Cholesterol: 42 mg/dL (ref 0–99)
Total CHOL/HDL Ratio: 2.2 ratio
Triglycerides: 69 mg/dL (ref <150)
VLDL: 14 mg/dL (ref 0–40)

## 2023-09-18 LAB — CBC
HCT: 48.7 % (ref 39.0–52.0)
Hemoglobin: 15.9 g/dL (ref 13.0–17.0)
MCH: 30.3 pg (ref 26.0–34.0)
MCHC: 32.6 g/dL (ref 30.0–36.0)
MCV: 92.9 fL (ref 80.0–100.0)
Platelets: 148 10*3/uL — ABNORMAL LOW (ref 150–400)
RBC: 5.24 MIL/uL (ref 4.22–5.81)
RDW: 13 % (ref 11.5–15.5)
WBC: 10.9 10*3/uL — ABNORMAL HIGH (ref 4.0–10.5)
nRBC: 0 % (ref 0.0–0.2)

## 2023-09-18 LAB — COMPREHENSIVE METABOLIC PANEL WITH GFR
ALT: 270 U/L — ABNORMAL HIGH (ref 0–44)
AST: 411 U/L — ABNORMAL HIGH (ref 15–41)
Albumin: 4.2 g/dL (ref 3.5–5.0)
Alkaline Phosphatase: 123 U/L (ref 38–126)
Anion gap: 15 (ref 5–15)
BUN: 13 mg/dL (ref 8–23)
CO2: 24 mmol/L (ref 22–32)
Calcium: 9.6 mg/dL (ref 8.9–10.3)
Chloride: 97 mmol/L — ABNORMAL LOW (ref 98–111)
Creatinine, Ser: 0.77 mg/dL (ref 0.61–1.24)
GFR, Estimated: >60 mL/min (ref >60)
Glucose, Bld: 172 mg/dL — ABNORMAL HIGH (ref 70–99)
Potassium: 4 mmol/L (ref 3.5–5.1)
Sodium: 136 mmol/L (ref 135–145)
Total Bilirubin: 3.1 mg/dL — ABNORMAL HIGH (ref 0.0–1.2)
Total Protein: 7.1 g/dL (ref 6.5–8.1)

## 2023-09-18 LAB — BILIRUBIN, DIRECT: Bilirubin, Direct: 1.8 mg/dL — ABNORMAL HIGH (ref 0.0–0.2)

## 2023-09-18 LAB — LIPASE, BLOOD: Lipase: 2992 U/L — ABNORMAL HIGH (ref 11–51)

## 2023-09-18 LAB — ETHANOL: Alcohol, Ethyl (B): <10 mg/dL (ref <10)

## 2023-09-18 MED ORDER — MORPHINE SULFATE (PF) 2 MG/ML IV SOLN: 2.0000 mg | INTRAVENOUS | Status: DC | PRN

## 2023-09-18 MED ORDER — TAMSULOSIN HCL 0.4 MG PO CAPS
0.4000 mg | ORAL_CAPSULE | Freq: Every day | ORAL | Status: DC
  Administered 2023-09-18 – 2023-09-24 (×7): 0.4 mg via ORAL
  Filled 2023-09-18 (×7): qty 1

## 2023-09-18 MED ORDER — MULTI-VITAMINS PO TABS: 1.0000 | ORAL_TABLET | Freq: Every day | ORAL | Status: DC

## 2023-09-18 MED ORDER — HYDRALAZINE HCL 20 MG/ML IJ SOLN: 5.0000 mg | INTRAMUSCULAR | Status: DC | PRN

## 2023-09-18 MED ORDER — LACTATED RINGERS IV BOLUS
500.0000 mL | Freq: Once | INTRAVENOUS | Status: AC
  Administered 2023-09-18: 500 mL via INTRAVENOUS

## 2023-09-18 MED ORDER — PANTOPRAZOLE SODIUM 40 MG PO TBEC
40.0000 mg | DELAYED_RELEASE_TABLET | Freq: Every day | ORAL | Status: DC
  Administered 2023-09-19 – 2023-09-22 (×2): 40 mg via ORAL
  Filled 2023-09-18 (×3): qty 1

## 2023-09-18 MED ORDER — LOSARTAN POTASSIUM 25 MG PO TABS
25.0000 mg | ORAL_TABLET | Freq: Every day | ORAL | Status: DC
  Administered 2023-09-18 – 2023-09-22 (×3): 25 mg via ORAL
  Filled 2023-09-18 (×4): qty 1

## 2023-09-18 MED ORDER — ONDANSETRON HCL 4 MG/2ML IJ SOLN: 4.0000 mg | Freq: Four times a day (QID) | INTRAMUSCULAR | Status: DC | PRN

## 2023-09-18 MED ORDER — SODIUM CHLORIDE 0.9 % IV SOLN: INTRAVENOUS | Status: DC

## 2023-09-18 MED ORDER — SERTRALINE HCL 50 MG PO TABS
25.0000 mg | ORAL_TABLET | Freq: Every day | ORAL | Status: DC
  Administered 2023-09-18 – 2023-09-24 (×7): 25 mg via ORAL
  Filled 2023-09-18 (×7): qty 1

## 2023-09-18 MED ORDER — OXYCODONE HCL 5 MG PO TABS
5.0000 mg | ORAL_TABLET | ORAL | Status: DC | PRN
  Administered 2023-09-18: 5 mg via ORAL
  Filled 2023-09-18: qty 1

## 2023-09-18 MED ORDER — ALUM & MAG HYDROXIDE-SIMETH 200-200-20 MG/5ML PO SUSP
30.0000 mL | ORAL | Status: DC | PRN
  Administered 2023-09-18: 30 mL via ORAL
  Filled 2023-09-18: qty 30

## 2023-09-18 MED ORDER — FENTANYL CITRATE PF 50 MCG/ML IJ SOSY
25.0000 ug | PREFILLED_SYRINGE | Freq: Once | INTRAMUSCULAR | Status: AC
  Administered 2023-09-18: 25 ug via INTRAVENOUS
  Filled 2023-09-18: qty 1

## 2023-09-18 MED ORDER — IOHEXOL 300 MG/ML  SOLN
100.0000 mL | Freq: Once | INTRAMUSCULAR | Status: AC | PRN
  Administered 2023-09-18: 100 mL via INTRAVENOUS

## 2023-09-18 MED ORDER — RIVASTIGMINE 9.5 MG/24HR TD PT24
9.5000 mg | MEDICATED_PATCH | Freq: Every day | TRANSDERMAL | Status: DC
Start: 2023-09-19 — End: 2023-09-25
  Administered 2023-09-21 – 2023-09-25 (×5): 9.5 mg via TRANSDERMAL
  Filled 2023-09-18 (×7): qty 1

## 2023-09-18 MED ORDER — HEPARIN SODIUM (PORCINE) 5000 UNIT/ML IJ SOLN
5000.0000 [IU] | Freq: Three times a day (TID) | INTRAMUSCULAR | Status: DC
  Administered 2023-09-19 – 2023-09-25 (×17): 5000 [IU] via SUBCUTANEOUS
  Filled 2023-09-18 (×18): qty 1

## 2023-09-18 MED ORDER — ADULT MULTIVITAMIN W/MINERALS CH
1.0000 | ORAL_TABLET | Freq: Every day | ORAL | Status: DC
Start: 2023-09-19 — End: 2023-09-25
  Administered 2023-09-19 – 2023-09-25 (×4): 1 via ORAL
  Filled 2023-09-18 (×6): qty 1

## 2023-09-18 MED ORDER — MEMANTINE HCL 10 MG PO TABS
10.0000 mg | ORAL_TABLET | Freq: Every day | ORAL | Status: DC
Start: 2023-09-19 — End: 2023-09-25
  Administered 2023-09-19 – 2023-09-25 (×5): 10 mg via ORAL
  Filled 2023-09-18 (×7): qty 1

## 2023-09-18 MED ORDER — VITAMIN B-12 1000 MCG PO TABS
1000.0000 ug | ORAL_TABLET | Freq: Every morning | ORAL | Status: DC
Start: 2023-09-19 — End: 2023-09-25
  Administered 2023-09-19 – 2023-09-25 (×5): 1000 ug via ORAL
  Filled 2023-09-18 (×6): qty 1

## 2023-09-18 MED ORDER — ONDANSETRON HCL 4 MG PO TABS: 4.0000 mg | ORAL_TABLET | Freq: Four times a day (QID) | ORAL | Status: DC | PRN

## 2023-09-18 MED ORDER — QUETIAPINE 12.5 MG HALF TABLET
25.0000 mg | ORAL_TABLET | Freq: Every day | ORAL | Status: DC
  Administered 2023-09-18 – 2023-09-22 (×5): 25 mg via ORAL
  Filled 2023-09-18 (×2): qty 2
  Filled 2023-09-18: qty 1
  Filled 2023-09-18 (×2): qty 2

## 2023-09-18 NOTE — Progress Notes (Signed)
 Spoke with pt's wife about order for Bipap. She stated he has not been wearing due to having a fractured nose. Pt uses Nasal pillows at home and we do not have them available here. She also states he has not been wearing consistently at home but could bring his home unit in if needed. At this time pt is resting comfortably on RA.

## 2023-09-18 NOTE — ED Triage Notes (Signed)
 Pt with abd pain started yesterday. Pt with nausea and belching, took a nap yesterday and felt fine.  Abd pain returned today at 1100. Pt pale.  Diarrhea x 2 . Wife gave information due to pt with dementia.

## 2023-09-18 NOTE — H&P (Signed)
 [facility] H&P   Patient Demographics:    Joel Nelson, is a 89 y.o. male  MRN: [id number]   DOB - July 08, 1933  Admit Date - 09/18/2023  Outpatient Primary MD for the patient is Sasser, Ky Phillips, MD  Referring MD/NP/PA: PA Celeste   Patient coming from: home  Chief Complaint  Patient presents with   Abdominal Pain      HPI:    Joel Nelson  is a 89 y.o. male, with past medical history of hypertension, hyperlipidemia, CAD, dementia, OSA on BiPAP, sick sinus syndrome status post pacemaker. - Was brought to ED by his wife secondary to complaints of abdominal pain, nausea, and distention patient with dementia, poor historian, it was obtained by wife and daughter-in-law at bedside.  Patient with significant pain started yesterday, resolved after some belching, again this morning, he is complaining of more abdominal pain, nausea, as well family report diarrhea earlier this morning.  Prompted them to bring him to the ED. - ED his workup significant for elevated lipase at 3000 range, AST of 400 range, ALT at 270, total bili elevated at 3.1 with direct bili of 1.8, white blood cell count at 10.9, UA is negative, alcohol level is negative, CT abdomen pelvis significant for pancreatitis, hepatic steatosis, with severe prostatomegaly, mild hepatic steatosis, no radiopaque stones or wall thickening of the gallbladder, no intrahepatic or extrahepatic biliary ductal dilation, portal veins are patent, [facility] consulted to admit.   Review of systems:    Poor historian, unable to provide reliable review of system.   With Past History of the following :    Past Medical History:  Diagnosis Date   Coronary artery disease 1995    remote left circumflex angioplasty without stent placement in 1995   Dementia (HCC)    Excessive daytime sleepiness 11/22/2017   History of kidney stones     History of shingles    Hyperlipidemia    Hypertension    Melanoma (HCC)    Memory loss    OSA treated with BiPAP    moderate obstructive sleep apnea with an AHI of 18.7/h and mild central sleep apnea with his CAI of 5.4/h.On BiPAP at 9/5cm H2O   Pre-diabetes    Presence of permanent cardiac pacemaker 03/24/2016   Sick sinus syndrome (HCC) 03/24/2016   Syncope 03/24/2016      Past Surgical History:  Procedure Laterality Date   EP IMPLANTABLE DEVICE N/A 03/24/2016   SJM Assurity MRI PPM implanted by Dr Nunzio Belch for sick sinus syndrome   herniated disk repair        Social History:     Social History   Tobacco Use   Smoking status: Never   Smokeless tobacco: Never  Substance Use Topics   Alcohol use: No       Family History :     Family History  Problem Relation Age of Onset  Alzheimer's disease Mother    Lung cancer Sister       Home Medications:   Prior to Admission medications   Medication Sig Start Date End Date Taking? Authorizing Provider  aspirin EC 81 MG tablet Take 162 mg by mouth daily.   Yes [provider]  cyanocobalamin (VITAMIN B12) 1000 MCG tablet Take 1,000 mcg by mouth every morning.   Yes [provider]  ezetimibe (ZETIA) 10 MG tablet Take 10 mg by mouth daily. 12/01/15  Yes [provider]  fluticasone (FLONASE) 50 MCG/ACT nasal spray Place 1 spray into both nostrils daily as needed for allergies.   Yes [provider]  losartan (COZAAR) 25 MG tablet Take 25 mg by mouth daily. 12/22/13  Yes [provider]  memantine (NAMENDA) 10 MG tablet Take 10 mg by mouth daily.   Yes [provider]  Multiple Vitamin (MULTI-VITAMINS) TABS Take 1 tablet by mouth daily.   Yes [provider]  nitroGLYCERIN (NITROSTAT) 0.4 MG SL tablet Place 0.4 mg under the tongue every 5 (five) minutes as needed for chest pain.  08/11/10  Yes [provider]  pantoprazole (PROTONIX) 40 MG tablet Take 40 mg  by mouth daily. 09/18/23  Yes [provider]  Polyethyl Glycol-Propyl Glycol (SYSTANE) 0.4-0.3 % SOLN Apply 1 drop to eye daily as needed (for dry eye).   Yes [provider]  rivastigmine (EXELON) 9.5 mg/24hr Place 9.5 mg onto the skin daily.   Yes [provider]  rosuvastatin (CRESTOR) 5 MG tablet Take 5 mg by mouth daily.   Yes [provider]  sertraline (ZOLOFT) 50 MG tablet Take 25 mg by mouth at bedtime.   Yes [provider]  tamsulosin (FLOMAX) 0.4 MG CAPS capsule Take 0.4 mg by mouth daily. 11/21/15  Yes [provider]  acetaminophen (TYLENOL) 500 MG tablet Take 1 tablet (500 mg total) by mouth every 6 (six) hours as needed. 08/23/23   Debbra Fairy, PA-C     Allergies:    No Known Allergies   Physical Exam:   Vitals  Blood pressure (!) 162/87, pulse 80, temperature 98.4 F (36.9 C), temperature source Oral, resp. rate 18, height 5\' 8"  (1.727 m), weight 78.9 kg, SpO2 95%.   1. General Frail elderly male, laying in bed, no apparent distress  2.  Wake, alert, oriented x 1, pleasantly demented  3. No F.N deficits, ALL C.Nerves Intact, Strength 5/5 all 4 extremities, Sensation intact all 4 extremities, Plantars down going.  4. Ears and Eyes appear Normal, Conjunctivae clear, PERRLA. Moist Oral Mucosa.  5. Supple Neck, No JVD, No cervical lymphadenopathy appriciated, No Carotid Bruits.  6. Symmetrical Chest wall movement, Good air movement bilaterally, CTAB.  7. RRR, No Gallops, Rubs or Murmurs, No Parasternal Heave.  8. Positive Bowel Sounds, Abdomen Soft, has epigastric tenderness, No organomegaly appriciated,No rebound -guarding or rigidity.  9.  No Cyanosis, Normal Skin Turgor, No Skin Rash or Bruise.  10. Good muscle tone,  joints appear normal , no effusions, Normal ROM.     Data Review:    CBC Recent Labs  Lab 09/18/23 1509  WBC 10.9*  HGB 15.9  HCT 48.7  PLT 148*  MCV 92.9  MCH 30.3  MCHC 32.6   RDW 13.0   ------------------------------------------------------------------------------------------------------------------  Chemistries  Recent Labs  Lab 09/18/23 1509  NA 136  K 4.0  CL 97*  CO2 24  GLUCOSE 172*  BUN 13  CREATININE 0.77  CALCIUM 9.6  AST 411*  ALT 270*  ALKPHOS 123  BILITOT 3.1*   ------------------------------------------------------------------------------------------------------------------ estimated creatinine clearance is 62.9 mL/min (by C-G formula based on SCr of 0.77 mg/dL). ------------------------------------------------------------------------------------------------------------------ No results for input(s): "TSH", "T4TOTAL", "T3FREE", "THYROIDAB" in the last 72 hours.  Invalid input(s): "FREET3"  Coagulation profile No results for input(s): "INR", "PROTIME" in the last 168 hours. ------------------------------------------------------------------------------------------------------------------- No results for input(s): "DDIMER" in the last 72 hours. -------------------------------------------------------------------------------------------------------------------  Cardiac Enzymes No results for input(s): "CKMB", "TROPONINI", "MYOGLOBIN" in the last 168 hours.  Invalid input(s): "CK" ------------------------------------------------------------------------------------------------------------------ No results found for: "BNP"   ---------------------------------------------------------------------------------------------------------------  Urinalysis    Component Value Date/Time   COLORURINE YELLOW 08/23/2023 1149   APPEARANCEUR CLEAR 08/23/2023 1149   LABSPEC 1.008 08/23/2023 1149   PHURINE 7.0 08/23/2023 1149   GLUCOSEU NEGATIVE 08/23/2023 1149   HGBUR SMALL (A) 08/23/2023 1149   BILIRUBINUR NEGATIVE 08/23/2023 1149   KETONESUR NEGATIVE 08/23/2023 1149   PROTEINUR NEGATIVE 08/23/2023 1149   NITRITE NEGATIVE 08/23/2023 1149    LEUKOCYTESUR NEGATIVE 08/23/2023 1149    ----------------------------------------------------------------------------------------------------------------   Imaging Results:    CT ABDOMEN PELVIS W CONTRAST Result Date: 09/18/2023 CLINICAL DATA:  Abdominal pain, acute, nonlocalized EXAM: CT ABDOMEN AND PELVIS WITH CONTRAST TECHNIQUE: Multidetector CT imaging of the abdomen and pelvis was performed using the standard protocol following bolus administration of intravenous contrast. RADIATION DOSE REDUCTION: This exam was performed according to the departmental dose-optimization program which includes automated exposure control, adjustment of the mA and/or kV according to patient size and/or use of iterative reconstruction technique. CONTRAST:  100mL OMNIPAQUE IOHEXOL 300 MG/ML  SOLN COMPARISON:  None Available. FINDINGS: Lower chest: No focal airspace consolidation or pleural effusion.Right atrium and right ventricle pacemaker wires partially visualized. Dense multi-vessel coronary atherosclerosis. Hepatobiliary: No mass. Mild hepatic steatosis.No radiopaque stones or wall thickening of the gallbladder.No intrahepatic or extrahepatic biliary ductal dilation.The portal veins are patent. Pancreas: No mass. Regions of parenchymal atrophy with subtle ductal dilation present. Parenchymal calcifications also noted. These findings are suggestive of chronic pancreatitis. There is moderate inflammatory stranding surrounding the pancreatic body and tail with acute peripancreatic fluid extending into the anterior left pararenal space in the left paracolic gutter. The inflammatory stranding extends around the pancreatic head, uncinate process, and the second portion of the duodenum.No well-formed or drainable peripancreatic fluid collection. Spleen: Normal size. No mass. Adrenals/Urinary Tract: No adrenal masses. multiple bilateral hypodensities are noted in both kidneys, some of which are too small to definitively  characterize, but likely small cysts. no nephrolithiasis or hydronephrosis. The urinary bladder is distended without focal abnormality. Stomach/Bowel: The stomach is decompressed without focal abnormality. No small bowel wall thickening or inflammation. No small bowel obstruction. Normal appendix. Descending and sigmoid colonic diverticulosis. No changes of acute diverticulitis. Vascular/Lymphatic: No aortic aneurysm. Descending and sigmoid colonic diverticulosis. No changes of acute diverticulitis. No intraabdominal or pelvic lymphadenopathy. Reproductive: Severe prostatomegaly.No free pelvic fluid. Other: No pneumoperitoneum or ascites. Musculoskeletal: No acute fracture or destructive lesion.Multilevel degenerative disc disease of the spine. Mild, grade 1 anterolisthesis of L4 on L5 due to bilateral pars interarticularis defects at L4. IMPRESSION: 1. Findings consistent with acute interstitial edematous pancreatitis. Correlation with serum lipase recommended. Underlying findings of chronic pancreatitis also noted. No well-formed or drainable peripancreatic fluid collection. 2. Descending and sigmoid colonic diverticulosis. No changes of acute diverticulitis. 3. Hepatic steatosis. 4. Severe prostatomegaly. Electronically Signed   By: Rance Burrows M.D.   On: 09/18/2023 17:19     Assessment & Plan:  Principal Problem:   Acute pancreatitis Active Problems:   Coronary artery disease involving native coronary artery of native heart without angina pectoris   Sick sinus syndrome (HCC)   OSA treated with BiPAP   Benign essential HTN    Acute pancreatitis Transaminitis - Clear etiology, lipase is elevated at 2992, FTs are elevated as well, including bilirubin, but alk phos within normal limit, no evidence of gallstone in gallbladder, common bile duct with no evidence of stone and within normal limits on CT abdomen pelvis. - No history of alcohol use. - Will check lipid panel. - Will check right  upper quadrant ultrasound for further evaluation for cholelithiasis or choledocholithiasis - Continue to trend LFTs - Continue with aggressive IV fluid hydration given significantly elevated lipase and evidence of pancreatitis in this patient with high risk for complications, but Theressa Flatness hold to discontinue or decrease IV fluids if he develops signs of volume overload.  - GI consult placed in EPIC. - check hepatitis panel  CAD status post PTCA of the circumflex in 1995.  LVEF 55 to 60% by echocardiogram in 2017.  - Denies any chest pain. -Will hold aspirin for now pending his further workup to ensure no further workup is anticipated . - Hold Zetia and statin due to transaminitis  Hyperlipidemia  - Hold Zetia and statin due to elevated LFTs.  Sick sinus syndrome/sinus bradycardia - Status post permanent pacemaker - Monitor on telemetry  OSA. - Continue with BiPAP  Hypertension - Continue with Cozaar 25 mg daily, continue with as needed hydralazine  Dementia - Continue with home medications   DVT Prophylaxis Heparin  AM Labs Ordered, also please review Full Orders  Family Communication: Admission, patients condition and plan of care including tests being ordered have been discussed with the patient and wife and daughter-in-law at bedside, who indicate understanding and agree with the plan and Code Status.  Code Status full code  Likely DC to home  Consults called: ED discussed with GI, I have requested gastroenterology consult in epic   Admission status: Inpatient  Time spent in minutes : 70 minutes   Seena Dadds M.D on 09/18/2023 at 9:05 PM   [facility] - Office  [phone]

## 2023-09-18 NOTE — ED Provider Notes (Signed)
 [facility] EMERGENCY DEPARTMENT AT [facility] Provider Note   CSN: [id number] Arrival date & time: 09/18/23  1411     History  Chief Complaint  Patient presents with   Abdominal Pain    Joel Nelson is a 89 y.o. male.Is an 89 year old male with history of dementia, history taken from his wife.  Presents ER today for abdominal pain and distention.  He had an episode of abdominal pain yesterday which resolved after he had some belching and then took a nap.  He woke up feeling okay this morning, was able to eat his breakfast of Cheerios around 9 AM.  About 2 hours prior to arrival he started complaining of more abdominal pain, his wife noticed distention of his abdomen and he was having nausea.  He also reported 2 episodes of diarrhea earlier this morning.   Abdominal Pain      Home Medications Prior to Admission medications   Medication Sig Start Date End Date Taking? Authorizing Provider  aspirin EC 81 MG tablet Take 162 mg by mouth daily.   Yes [provider]  cyanocobalamin (VITAMIN B12) 1000 MCG tablet Take 1,000 mcg by mouth every morning.   Yes [provider]  ezetimibe (ZETIA) 10 MG tablet Take 10 mg by mouth daily. 12/01/15  Yes [provider]  fluticasone (FLONASE) 50 MCG/ACT nasal spray Place 1 spray into both nostrils daily as needed for allergies.   Yes [provider]  losartan (COZAAR) 25 MG tablet Take 25 mg by mouth daily. 12/22/13  Yes [provider]  memantine (NAMENDA) 10 MG tablet Take 10 mg by mouth daily.   Yes [provider]  Multiple Vitamin (MULTI-VITAMINS) TABS Take 1 tablet by mouth daily.   Yes [provider]  nitroGLYCERIN (NITROSTAT) 0.4 MG SL tablet Place 0.4 mg under the tongue every 5 (five) minutes as needed for chest pain.  08/11/10  Yes [provider]  pantoprazole (PROTONIX) 40 MG tablet Take 40 mg by mouth daily. 09/18/23  Yes [provider]   Polyethyl Glycol-Propyl Glycol (SYSTANE) 0.4-0.3 % SOLN Apply 1 drop to eye daily as needed (for dry eye).   Yes [provider]  rivastigmine (EXELON) 9.5 mg/24hr Place 9.5 mg onto the skin daily.   Yes [provider]  rosuvastatin (CRESTOR) 5 MG tablet Take 5 mg by mouth daily.   Yes [provider]  sertraline (ZOLOFT) 50 MG tablet Take 25 mg by mouth at bedtime.   Yes [provider]  tamsulosin (FLOMAX) 0.4 MG CAPS capsule Take 0.4 mg by mouth daily. 11/21/15  Yes [provider]  acetaminophen (TYLENOL) 500 MG tablet Take 1 tablet (500 mg total) by mouth every 6 (six) hours as needed. 08/23/23   Debbra Fairy, PA-C      Allergies    Patient has no known allergies.    Review of Systems   Review of Systems  Gastrointestinal:  Positive for abdominal pain.    Physical Exam Updated Vital Signs BP (!) 175/87   Pulse 82   Temp (!) 97.3 F (36.3 C) (Temporal)   Resp 18   Ht 5' 8.5" (1.74 m)   Wt 86.2 kg   SpO2 94%   BMI 28.47 kg/m  Physical Exam Vitals and nursing note reviewed.  Constitutional:      General: He is not in acute distress.    Appearance: He is well-developed.  HENT:     Head: Normocephalic and atraumatic.  Eyes:  Conjunctiva/sclera: Conjunctivae normal.  Cardiovascular:     Rate and Rhythm: Normal rate and regular rhythm.     Heart sounds: No murmur heard. Pulmonary:     Effort: Pulmonary effort is normal. No respiratory distress.     Breath sounds: Normal breath sounds.  Abdominal:     General: Bowel sounds are normal. There is distension.     Palpations: Abdomen is soft.     Tenderness: There is abdominal tenderness in the right upper quadrant and epigastric area. There is no guarding or rebound.     Comments: Mild distention noted in the upper abdomen  Musculoskeletal:        General: No swelling.     Cervical back: Neck supple.  Skin:    General: Skin is warm and dry.     Capillary Refill: Capillary  refill takes less than 2 seconds.  Neurological:     General: No focal deficit present.     Mental Status: He is alert and oriented to person, place, and time.  Psychiatric:        Mood and Affect: Mood normal.     ED Results / Procedures / Treatments   Labs (all labs ordered are listed, but only abnormal results are displayed) Labs Reviewed  LIPASE, BLOOD - Abnormal; Notable for the following components:      Result Value   Lipase 2,992 (*)    All other components within normal limits  COMPREHENSIVE METABOLIC PANEL WITH GFR - Abnormal; Notable for the following components:   Chloride 97 (*)    Glucose, Bld 172 (*)    AST 411 (*)    ALT 270 (*)    Total Bilirubin 3.1 (*)    All other components within normal limits  CBC - Abnormal; Notable for the following components:   WBC 10.9 (*)    Platelets 148 (*)    All other components within normal limits  ETHANOL  BILIRUBIN, DIRECT    EKG None  Radiology CT ABDOMEN PELVIS W CONTRAST Result Date: 09/18/2023 CLINICAL DATA:  Abdominal pain, acute, nonlocalized EXAM: CT ABDOMEN AND PELVIS WITH CONTRAST TECHNIQUE: Multidetector CT imaging of the abdomen and pelvis was performed using the standard protocol following bolus administration of intravenous contrast. RADIATION DOSE REDUCTION: This exam was performed according to the departmental dose-optimization program which includes automated exposure control, adjustment of the mA and/or kV according to patient size and/or use of iterative reconstruction technique. CONTRAST:  100mL OMNIPAQUE IOHEXOL 300 MG/ML  SOLN COMPARISON:  None Available. FINDINGS: Lower chest: No focal airspace consolidation or pleural effusion.Right atrium and right ventricle pacemaker wires partially visualized. Dense multi-vessel coronary atherosclerosis. Hepatobiliary: No mass. Mild hepatic steatosis.No radiopaque stones or wall thickening of the gallbladder.No intrahepatic or extrahepatic biliary ductal dilation.The  portal veins are patent. Pancreas: No mass. Regions of parenchymal atrophy with subtle ductal dilation present. Parenchymal calcifications also noted. These findings are suggestive of chronic pancreatitis. There is moderate inflammatory stranding surrounding the pancreatic body and tail with acute peripancreatic fluid extending into the anterior left pararenal space in the left paracolic gutter. The inflammatory stranding extends around the pancreatic head, uncinate process, and the second portion of the duodenum.No well-formed or drainable peripancreatic fluid collection. Spleen: Normal size. No mass. Adrenals/Urinary Tract: No adrenal masses. multiple bilateral hypodensities are noted in both kidneys, some of which are too small to definitively characterize, but likely small cysts. no nephrolithiasis or hydronephrosis. The urinary bladder is distended without focal abnormality. Stomach/Bowel: The stomach is decompressed  without focal abnormality. No small bowel wall thickening or inflammation. No small bowel obstruction. Normal appendix. Descending and sigmoid colonic diverticulosis. No changes of acute diverticulitis. Vascular/Lymphatic: No aortic aneurysm. Descending and sigmoid colonic diverticulosis. No changes of acute diverticulitis. No intraabdominal or pelvic lymphadenopathy. Reproductive: Severe prostatomegaly.No free pelvic fluid. Other: No pneumoperitoneum or ascites. Musculoskeletal: No acute fracture or destructive lesion.Multilevel degenerative disc disease of the spine. Mild, grade 1 anterolisthesis of L4 on L5 due to bilateral pars interarticularis defects at L4. IMPRESSION: 1. Findings consistent with acute interstitial edematous pancreatitis. Correlation with serum lipase recommended. Underlying findings of chronic pancreatitis also noted. No well-formed or drainable peripancreatic fluid collection. 2. Descending and sigmoid colonic diverticulosis. No changes of acute diverticulitis. 3. Hepatic  steatosis. 4. Severe prostatomegaly. Electronically Signed   By: Rance Burrows M.D.   On: 09/18/2023 17:19    Procedures Procedures    Medications Ordered in ED Medications  fentaNYL (SUBLIMAZE) injection 25 mcg (25 mcg Intravenous Given 09/18/23 1721)  iohexol (OMNIPAQUE) 300 MG/ML solution 100 mL (100 mLs Intravenous Contrast Given 09/18/23 1657)  lactated ringers bolus 500 mL (0 mLs Intravenous Stopped 09/18/23 1849)    ED Course/ Medical Decision Making/ A&P                                 Medical Decision Making This patient presents to the ED for concern of abdominal pain, this involves an extensive number of treatment options, and is a complaint that carries with it a high risk of complications and morbidity.  The differential diagnosis includes gastritis, gastroenteritis, pancreatitis, bowel obstruction, appendicitis, cholecystitis, diverticulitis, DKA, nephrolithiasis, gastroparesis, other    Co morbidities that complicate the patient evaluation :   Dementia, pacemaker, hypertension, CAD   Additional history obtained:  Additional history obtained from EMR External records from outside source obtained and reviewed including prior notes and labs   Lab Tests:  I Ordered, and personally interpreted labs.  The pertinent results include: Lipase is elevated at 2992, CBC shows white blood cell count 10.9, CMP shows AST elevated at 411, ALT elevated at 270, bilirubin 3.1, normal alk phos   Imaging Studies ordered:  I ordered imaging studies including CT abdomen pelvis which shows gallstones, no dilated CBD, moderate inflammatory stranding around the pancreatic body and tail with acute peripancreatic fluid extending into the anterior left pararenal space and left paracolic gutter without any surrounding abscess or fluid collection.  Consistent with acute pancreatitis I independently visualized and interpreted imaging within scope of identifying emergent findings  I agree with  the radiologist interpretation   Consultations Obtained:  I requested consultation with the on-call gastroenterologist, Dr. Alita Irwin,  and discussed lab and imaging findings as well as pertinent plan - they recommend: Ultrasound tomorrow morning, give fluids, trend labs.  At this time patient does not need MRCP given his bilirubin is under 4 and his alk phos is normal.  Patient is appropriate to be admitted to [facility] rather than [facility].  Unable to get MRCP at [facility] as patient has a pacemaker.  I consulted with hospitalist Dr. Clarisa Crooked is agreeable with admission   Problem List / ED Course / Critical interventions / Medication management  Acute pancreatitis-patient started having some pain yesterday and then was able to sleep, started having worse pain today.  He has some upper abdominal distention and tenderness, improved after 25 mcg of fentanyl.  He has elevated LFTs and  mildly elevated bilirubin, normal alk phos.  CT shows acute pancreatitis, consulted with GI hospitalist as above and will plan to admit at [facility].  Patient and family are agreeable.  I have reviewed the patients home medicines and have made adjustments as needed   Social Determinants of Health: Patient lives with his wife, patient has never drink alcohol  Amount and/or Complexity of Data Reviewed Labs: ordered. Radiology: ordered.  Risk Prescription drug management. Decision regarding hospitalization.           Final Clinical Impression(s) / ED Diagnoses Final diagnoses:  Acute pancreatitis, unspecified complication status, unspecified pancreatitis type    Rx / DC Orders ED Discharge Orders     None         Joshua Nieves 09/18/23 1851    Cheyenne Cotta, MD 09/20/23 1017

## 2023-09-19 ENCOUNTER — Inpatient Hospital Stay (HOSPITAL_COMMUNITY)

## 2023-09-19 DIAGNOSIS — K8000 Calculus of gallbladder with acute cholecystitis without obstruction: Secondary | ICD-10-CM

## 2023-09-19 DIAGNOSIS — D72829 Elevated white blood cell count, unspecified: Secondary | ICD-10-CM | POA: Diagnosis not present

## 2023-09-19 DIAGNOSIS — I10 Essential (primary) hypertension: Secondary | ICD-10-CM

## 2023-09-19 DIAGNOSIS — K859 Acute pancreatitis without necrosis or infection, unspecified: Secondary | ICD-10-CM

## 2023-09-19 DIAGNOSIS — R7989 Other specified abnormal findings of blood chemistry: Secondary | ICD-10-CM

## 2023-09-19 DIAGNOSIS — G4733 Obstructive sleep apnea (adult) (pediatric): Secondary | ICD-10-CM | POA: Diagnosis not present

## 2023-09-19 DIAGNOSIS — I495 Sick sinus syndrome: Secondary | ICD-10-CM

## 2023-09-19 DIAGNOSIS — R748 Abnormal levels of other serum enzymes: Secondary | ICD-10-CM

## 2023-09-19 LAB — HEPATITIS PANEL, ACUTE
HCV Ab: NONREACTIVE
Hep A IgM: NONREACTIVE
Hep B C IgM: NONREACTIVE
Hepatitis B Surface Ag: NONREACTIVE

## 2023-09-19 LAB — CBC
HCT: 44.5 % (ref 39.0–52.0)
Hemoglobin: 14.6 g/dL (ref 13.0–17.0)
MCH: 29.8 pg (ref 26.0–34.0)
MCHC: 32.8 g/dL (ref 30.0–36.0)
MCV: 90.8 fL (ref 80.0–100.0)
Platelets: 190 10*3/uL (ref 150–400)
RBC: 4.9 MIL/uL (ref 4.22–5.81)
RDW: 13.2 % (ref 11.5–15.5)
WBC: 12.1 10*3/uL — ABNORMAL HIGH (ref 4.0–10.5)
nRBC: 0 % (ref 0.0–0.2)

## 2023-09-19 LAB — BASIC METABOLIC PANEL WITH GFR
Anion gap: 12 (ref 5–15)
BUN: 13 mg/dL (ref 8–23)
CO2: 24 mmol/L (ref 22–32)
Calcium: 9 mg/dL (ref 8.9–10.3)
Chloride: 97 mmol/L — ABNORMAL LOW (ref 98–111)
Creatinine, Ser: 0.81 mg/dL (ref 0.61–1.24)
GFR, Estimated: >60 mL/min (ref >60)
Glucose, Bld: 123 mg/dL — ABNORMAL HIGH (ref 70–99)
Potassium: 4.3 mmol/L (ref 3.5–5.1)
Sodium: 133 mmol/L — ABNORMAL LOW (ref 135–145)

## 2023-09-19 LAB — HEPATIC FUNCTION PANEL
ALT: 762 U/L — ABNORMAL HIGH (ref 0–44)
AST: 321 U/L — ABNORMAL HIGH (ref 15–41)
Albumin: 3.6 g/dL (ref 3.5–5.0)
Alkaline Phosphatase: 125 U/L (ref 38–126)
Bilirubin, Direct: 3 mg/dL — ABNORMAL HIGH (ref 0.0–0.2)
Indirect Bilirubin: 1.7 mg/dL — ABNORMAL HIGH (ref 0.3–0.9)
Total Bilirubin: 4.7 mg/dL — ABNORMAL HIGH (ref 0.0–1.2)
Total Protein: 6.7 g/dL (ref 6.5–8.1)

## 2023-09-19 LAB — LIPASE, BLOOD: Lipase: 539 U/L — ABNORMAL HIGH (ref 11–51)

## 2023-09-19 MED ORDER — PIPERACILLIN-TAZOBACTAM 3.375 G IVPB 30 MIN
3.3750 g | Freq: Once | INTRAVENOUS | Status: AC
  Administered 2023-09-19: 3.375 g via INTRAVENOUS
  Filled 2023-09-19: qty 50

## 2023-09-19 MED ORDER — PIPERACILLIN-TAZOBACTAM 3.375 G IVPB
3.3750 g | Freq: Three times a day (TID) | INTRAVENOUS | Status: DC
  Administered 2023-09-19: 3.375 g via INTRAVENOUS
  Filled 2023-09-19: qty 50

## 2023-09-19 NOTE — Plan of Care (Signed)

## 2023-09-19 NOTE — Consult Note (Signed)
 Kilo Eshelman Faizan Charlane Westry, M.D. Gastroenterology & Hepatology                                           Patient Name: Joel Nelson Account #: @FLAACCTNO @   MRN: [id number] Admission Date: 09/18/2023 Date of Evaluation:  09/19/2023 Time of Evaluation: 11:03 AM  Chief Complaint:  Acute Pancreatitis /Elevated liver enzymes   HPI:  This is a 89 year old male with sick sinus syndrome s/p PPM, CAD , OSA on BiPAP presented with abdominal distention, abdominal pain and nausea. Gi is consulted for Acute Pancreatitis /Elevated liver enzymes   Patient is seen with son bedside this afternoon in the room.  Patient currently is feeling well denies any abdominal pain nausea vomiting. Patient does report that the day before coming to the hospital he had sudden onset of acute abdominal pain which was more generalized located.  He also reports associated yellowing of the skin and subjective fever and chills.  Patient denies any alcohol use, new medication or any herbal medications  Labs with initial white count 10.9 at this morning elevated at 12.1 Negative alcohol level Lipase 2992  TG: 69 Liver enzymes initially AST more than ALT but this morning ALT trending up to 762 and T. bili more than 4 Negative acute hep A, B, and C  CT abdomen pelvis with IV contrast without any intra or extrahepatic biliary dilation no masses identified but demonstrating AIP (acute interstitial pancreatitis)   Ultrasound : 1. Gallbladder sludge with mild gallbladder wall thickening and trace pericholecystic fluid. Negative sonographic versus sign. Equivocal findings. Correlate clinically for acute cholecystitis. 2. Hepatic steatosis. Please note limited evaluation for focal hepatic masses in a patient with hepatic steatosis due to decreased penetration of the acoustic ultrasound waves.   Last EGD : none Last Colonoscopy : 2010 , unable to open report , suggested repeat 5 years  Past Medical History: SEE CHRONIC ISSSUES: Past  Medical History:  Diagnosis Date   Coronary artery disease 1995    remote left circumflex angioplasty without stent placement in 1995   Dementia (HCC)    Excessive daytime sleepiness 11/22/2017   History of kidney stones    History of shingles    Hyperlipidemia    Hypertension    Melanoma (HCC)    Memory loss    OSA treated with BiPAP    moderate obstructive sleep apnea with an AHI of 18.7/h and mild central sleep apnea with his CAI of 5.4/h.On BiPAP at 9/5cm H2O   Pre-diabetes    Presence of permanent cardiac pacemaker 03/24/2016   Sick sinus syndrome (HCC) 03/24/2016   Syncope 03/24/2016   Past Surgical History:  Past Surgical History:  Procedure Laterality Date   EP IMPLANTABLE DEVICE N/A 03/24/2016   SJM Assurity MRI PPM implanted by Dr Nunzio [facility] for sick sinus syndrome   herniated disk repair     Family History:  Family History  Problem Relation Age of Onset   Alzheimer's disease Mother    Lung cancer Sister    Social History:  Social History   Tobacco Use   Smoking status: Never   Smokeless tobacco: Never  Vaping Use   Vaping status: Never Used  Substance Use Topics   Alcohol use: No   Drug use: No    Home Medications:  Prior to Admission medications   Medication Sig Start Date End Date Taking? Authorizing Provider  aspirin EC 81 MG tablet Take 162 mg by mouth daily.   Yes [provider]  cyanocobalamin (VITAMIN B12) 1000 MCG tablet Take 1,000 mcg by mouth every morning.   Yes [provider]  ezetimibe (ZETIA) 10 MG tablet Take 10 mg by mouth daily. 12/01/15  Yes [provider]  fluticasone (FLONASE) 50 MCG/ACT nasal spray Place 1 spray into both nostrils daily as needed for allergies.   Yes [provider]  losartan (COZAAR) 25 MG tablet Take 25 mg by mouth daily. 12/22/13  Yes [provider]  memantine (NAMENDA) 10 MG tablet Take 10 mg by mouth daily.   Yes [provider]  Multiple Vitamin  (MULTI-VITAMINS) TABS Take 1 tablet by mouth daily.   Yes [provider]  nitroGLYCERIN (NITROSTAT) 0.4 MG SL tablet Place 0.4 mg under the tongue every 5 (five) minutes as needed for chest pain.  08/11/10  Yes [provider]  pantoprazole (PROTONIX) 40 MG tablet Take 40 mg by mouth daily. 09/18/23  Yes [provider]  Polyethyl Glycol-Propyl Glycol (SYSTANE) 0.4-0.3 % SOLN Apply 1 drop to eye daily as needed (for dry eye).   Yes [provider]  rivastigmine (EXELON) 9.5 mg/24hr Place 9.5 mg onto the skin daily.   Yes [provider]  rosuvastatin (CRESTOR) 5 MG tablet Take 5 mg by mouth daily.   Yes [provider]  sertraline (ZOLOFT) 50 MG tablet Take 25 mg by mouth at bedtime.   Yes [provider]  tamsulosin (FLOMAX) 0.4 MG CAPS capsule Take 0.4 mg by mouth daily. 11/21/15  Yes [provider]  acetaminophen (TYLENOL) 500 MG tablet Take 1 tablet (500 mg total) by mouth every 6 (six) hours as needed. 08/23/23   Debbra Fairy, PA-C    Inpatient Medications:  Current Facility-Administered Medications:    0.9 %  sodium chloride infusion, , Intravenous, Continuous, Elgergawy, Ardia Kraft, MD, Last Rate: 75 mL/hr at 09/19/23 1010, New Bag at 09/19/23 1010   alum & mag hydroxide-simeth (MAALOX/MYLANTA) 200-200-20 MG/5ML suspension 30 mL, 30 mL, Oral, Q4H PRN, Elgergawy, Dawood S, MD, 30 mL at 09/18/23 2233   cyanocobalamin (VITAMIN B12) tablet 1,000 mcg, 1,000 mcg, Oral, q morning, Elgergawy, Dawood S, MD, 1,000 mcg at 09/19/23 0911   heparin injection 5,000 Units, 5,000 Units, Subcutaneous, Q8H, Elgergawy, Dawood S, MD   hydrALAZINE (APRESOLINE) injection 5 mg, 5 mg, Intravenous, Q4H PRN, Elgergawy, Dawood S, MD   losartan (COZAAR) tablet 25 mg, 25 mg, Oral, Daily, Elgergawy, Dawood S, MD, 25 mg at 09/19/23 0911   memantine (NAMENDA) tablet 10 mg, 10 mg, Oral, Daily, Elgergawy, Dawood S, MD, 10 mg at 09/19/23 0912   morphine (PF) 2  MG/ML injection 2 mg, 2 mg, Intravenous, Q4H PRN, Elgergawy, Dawood S, MD   multivitamin with minerals tablet 1 tablet, 1 tablet, Oral, Daily, Elgergawy, Dawood S, MD, 1 tablet at 09/19/23 0911   ondansetron (ZOFRAN) tablet 4 mg, 4 mg, Oral, Q6H PRN **OR** ondansetron (ZOFRAN) injection 4 mg, 4 mg, Intravenous, Q6H PRN, Elgergawy, Dawood S, MD   oxyCODONE (Oxy IR/ROXICODONE) immediate release tablet 5 mg, 5 mg, Oral, Q4H PRN, Elgergawy, Dawood S, MD, 5 mg at 09/18/23 2232   pantoprazole (PROTONIX) EC tablet 40 mg, 40 mg, Oral, Daily, Elgergawy, Dawood S, MD, 40 mg at 09/19/23 0911   piperacillin-tazobactam (ZOSYN) IVPB 3.375 g, 3.375 g, Intravenous, Once, [place], Gagan S, MD   piperacillin-tazobactam (ZOSYN) IVPB 3.375 g, 3.375 g, Intravenous, Q8H, [place], Gagan  S, MD   QUEtiapine (SEROQUEL) tablet 25 mg, 25 mg, Oral, QHS, Elgergawy, Dawood S, MD, 25 mg at 09/18/23 2233   rivastigmine (EXELON) 9.5 mg/24hr 9.5 mg, 9.5 mg, Transdermal, Daily, Elgergawy, Dawood S, MD   sertraline (ZOLOFT) tablet 25 mg, 25 mg, Oral, QHS, Elgergawy, Dawood S, MD, 25 mg at 09/18/23 2233   tamsulosin (FLOMAX) capsule 0.4 mg, 0.4 mg, Oral, Daily, Elgergawy, Dawood S, MD, 0.4 mg at 09/18/23 2233 Allergies: Patient has no known allergies.  Complete Review of Systems: GENERAL: negative for malaise, night sweats HEENT: No changes in hearing or vision, no nose bleeds or other nasal problems. NECK: Negative for lumps, goiter, pain and significant neck swelling RESPIRATORY: Negative for cough, wheezing CARDIOVASCULAR: Negative for chest pain, leg swelling, palpitations, orthopnea GI: SEE HPI MUSCULOSKELETAL: Negative for joint pain or swelling, back pain, and muscle pain. SKIN: Negative for lesions, rash PSYCH: Negative for sleep disturbance, mood disorder and recent psychosocial stressors. HEMATOLOGY Negative for prolonged bleeding, bruising easily, and swollen nodes. ENDOCRINE: Negative for cold or heat intolerance, polyuria,  polydipsia and goiter. NEURO: negative for tremor, gait imbalance, syncope and seizures. The remainder of the review of systems is noncontributory.  Physical Exam: BP (!) 138/53 (BP Location: Left Arm)   Pulse 77   Temp 98.1 F (36.7 C) (Oral)   Resp 20   Ht 5\' 8"  (1.727 m)   Wt 78.9 kg   SpO2 96%   BMI 26.45 kg/m  GENERAL: The patient is AO x3, in no acute distress. HEENT: Head is normocephalic and atraumatic. EOMI are intact. Mouth is well hydrated and without lesions. NECK: Supple. No masses LUNGS: Clear to auscultation. No presence of rhonchi/wheezing/rales. Adequate chest expansion HEART: RRR, normal s1 and s2. ABDOMEN: Soft, nontender, no guarding, no peritoneal signs, and nondistended. BS +. No masses. EXTREMITIES: Without any cyanosis, clubbing, rash, lesions or edema. NEUROLOGIC: AOx3, no focal motor deficit. SKIN: no jaundice, no rashes  Laboratory Data CBC:     Component Value Date/Time   WBC 12.1 (H) 09/19/2023 0815   RBC 4.90 09/19/2023 0815   HGB 14.6 09/19/2023 0815   HCT 44.5 09/19/2023 0815   PLT 190 09/19/2023 0815   MCV 90.8 09/19/2023 0815   MCH 29.8 09/19/2023 0815   MCHC 32.8 09/19/2023 0815   RDW 13.2 09/19/2023 0815   LYMPHSABS 1.2 03/24/2016 1000   MONOABS 0.7 03/24/2016 1000   EOSABS 0.1 03/24/2016 1000   BASOSABS 0.0 03/24/2016 1000   COAG: No results found for: "INR", "PROTIME"  BMP:     Latest Ref Rng & Units 09/19/2023    5:28 AM 09/18/2023    3:09 PM 08/23/2023   12:13 PM  BMP  Glucose 70 - 99 mg/dL 161  096  045   BUN 8 - 23 mg/dL 13  13  13    Creatinine 0.61 - 1.24 mg/dL 4.09  8.11  9.14   Sodium 135 - 145 mmol/L 133  136  137   Potassium 3.5 - 5.1 mmol/L 4.3  4.0  4.7   Chloride 98 - 111 mmol/L 97  97  104   CO2 22 - 32 mmol/L 24  24    Calcium 8.9 - 10.3 mg/dL 9.0  9.6      HEPATIC:     Latest Ref Rng & Units 09/19/2023    5:28 AM 09/18/2023    3:09 PM 03/24/2016   10:00 AM  Hepatic Function  Total Protein 6.5 - 8.1 g/dL  6.7  7.1  6.3   Albumin 3.5 - 5.0 g/dL 3.6  4.2  3.8   AST 15 - 41 U/L 321  411  29   ALT 0 - 44 U/L 762  270  46   Alk Phosphatase 38 - 126 U/L 125  123  48   Total Bilirubin 0.0 - 1.2 mg/dL 4.7  3.1  0.7   Bilirubin, Direct 0.0 - 0.2 mg/dL 3.0  1.8      CARDIAC:  Lab Results  Component Value Date   TROPONINI <0.03 03/24/2016     Imaging: I personally reviewed and interpreted the available imaging.  Assessment & Plan:  This is a 89 year old male with sick sinus syndrome s/p PPM, CAD , OSA on BiPAP presented with abdominal distention, abdominal pain and nausea. Gi is consulted for Acute Pancreatitis /Elevated liver enzymes   #Acute Pancreatitis #Elevated liver enzymes  This is a patient who is presenting with acute pancreatitis without any alcohol use or overt cholelithiasis on CT. Normal TG    Although given liver enzymes elevation pattern(initial AST elevation , now uptrending ALT and T bili>4) I am concerned of gallstone pancreatitis, either with a passed stone but choledocholithiasis needs to be ruled out    Given elevation in leukocytosis which could be reactive would cover the patient with broad-spectrum antibiotics   Recommend MRCP to evaluate for choledocholithiasis ,after ensuring that pacemaker is MRI compatible   IV fluids   Ultrasound with biliary sludge and suggesting acute cholecystitis, although on exam patient has no murpheys sign : consider surgical evaluation   Trend CMP and INR  Joel Nelson Faizan Joel Hands, MD Gastroenterology and Hepatology [facility] Gastroenterology  This chart has been completed using [facility] Dictation software, and while attempts have been made to ensure accuracy , certain words and phrases may not be transcribed as intended

## 2023-09-19 NOTE — Plan of Care (Signed)
  Problem: Pain Managment: Goal: General experience of comfort will improve and/or be controlled Outcome: Progressing   Problem: Safety: Goal: Ability to remain free from injury will improve Outcome: Progressing

## 2023-09-19 NOTE — Progress Notes (Signed)
   09/19/23 1902  TOC Brief Assessment  Insurance and Status Reviewed  Patient has primary care physician Yes  Home environment has been reviewed Single family home.  Prior level of function: Independent  Prior/Current Home Services No current home services  Readmission risk has been reviewed Yes  Transition of care needs no transition of care needs at this time   Transition of Care Department [facility]) has reviewed patient and no TOC needs have been identified at this time. We will continue to monitor patient advancement through interdisciplinary progression rounds. If new patient transition needs arise, please place a TOC consult.

## 2023-09-19 NOTE — Progress Notes (Addendum)
 Brief GI update note:  This is a 89 year old male with sick sinus syndrome s/p PPM, CAD , OSA on BiPAP presented with abdominal distention, abdominal pain and nausea.  Patient has been afebrile and hemodynamically stable with Tmax 98.51F  Labs with initial white count 10.9 at this morning elevated at 12.1  Negative alcohol level  Lipase 2992  TG: 69  Liver enzymes initially AST more than ALT but this morning ALT trending up to 762 and T. bili more than 4    CT abdomen pelvis with IV contrast without any intra or extrahepatic biliary dilation no masses identified but demonstrating AIP (acute interstitial pancreatitis)   Last EGD : none Last Colonoscopy : 2010 , unable to open report , suggested repeat 5 years  Recommendation  This is a patient who is presenting with acute pancreatitis without any alcohol use or overt cholelithiasis on CT  Although given liver enzymes elevation pattern(initial AST elevation , now uptrending ALT and T bili>4) I am concerned of gallstone pancreatitis.  Given elevation in leukocytosis which could be reactive would cover the patient with broad-spectrum antibiotics  Recommend MRCP to evaluate for choledocholithiasis at pacemaker compatible MRI facility  IV fluids  Follow-up viral hepatitis profile  Full consult note to follow  Addendum:  Ultrasound with biliary sludge and suggesting acute cholecystitis : recommend surgical evaluation

## 2023-09-19 NOTE — Progress Notes (Signed)
 [facility]  PROGRESS NOTE  Joel Nelson ZOX:[id number] DOB: 12-Dec-1933 DOA: 09/18/2023 PCP: Orest Bio, MD   Brief HPI:   89 y.o. male, with past medical history of hypertension, hyperlipidemia, CAD, dementia, OSA on BiPAP, sick sinus syndrome status post pacemaker. - Was brought to ED by his wife secondary to complaints of abdominal pain, nausea, and distention patient with dementia, poor historian, it was obtained by wife and daughter-in-law at bedside.  Patient with significant pain started yesterday, resolved after some belching, again this morning, he is complaining of more abdominal pain, nausea, as well family report diarrhea earlier this morning.  Prompted them to bring him to the ED. - ED his workup significant for elevated lipase at 3000 range, AST of 400 range, ALT at 270, total bili elevated at 3.1 with direct bili of 1.8, white blood cell count at 10.9, UA is negative, alcohol level is negative, CT abdomen pelvis significant for pancreatitis, hepatic steatosis, with severe prostatomegaly, mild hepatic steatosis, no radiopaque stones or wall thickening of the gallbladder, no intrahepatic or extrahepatic biliary ductal dilation, portal veins are patent.    Assessment/Plan:   Acute pancreatitis Transaminitis -Unclear etiology; lipase was elevated at 2992, has improved to 539 -LFTs are getting worse with ALT 762, total bili 4.7 -Discussed with GI, Dr. Alita Irwin, he recommends transfer to [facility] for MRCP and possible ERCP -Will initiate transfer to [facility] -Abdominal ultrasound is currently pending    CAD status post PTCA of the circumflex in 1995.  LVEF 55 to 60% by echocardiogram in 2017.  - Denies any chest pain. -Will hold aspirin for now pending his further workup to ensure no further workup is anticipated . - Hold Zetia and statin due to transaminitis   Hyperlipidemia  - Hold Zetia and statin due to elevated LFTs.   Sick sinus syndrome/sinus  bradycardia - Status post permanent pacemaker - Monitor on telemetry   OSA. - Continue with BiPAP   Hypertension - Continue with Cozaar 25 mg daily, continue with as needed hydralazine   Dementia - Continue with home medications     Medications     cyanocobalamin  1,000 mcg Oral q morning   heparin  5,000 Units Subcutaneous Q8H   losartan  25 mg Oral Daily   memantine  10 mg Oral Daily   multivitamin with minerals  1 tablet Oral Daily   pantoprazole  40 mg Oral Daily   QUEtiapine  25 mg Oral QHS   rivastigmine  9.5 mg Transdermal Daily   sertraline  25 mg Oral QHS   tamsulosin  0.4 mg Oral Daily     Data Reviewed:   CBG:  No results for input(s): "GLUCAP" in the last 168 hours.  SpO2: 92 %    Vitals:   09/18/23 1745 09/18/23 1929 09/19/23 0033 09/19/23 0432  BP:  (!) 162/87 131/77 130/63  Pulse: 82 80 76 72  Resp: 18 18 20 20   Temp:  98.4 F (36.9 C) 98.7 F (37.1 C) 98.2 F (36.8 C)  TempSrc:  Oral Oral Oral  SpO2: 94% 95% 93% 92%  Weight:  78.9 kg    Height:  5\' 8"  (1.727 m)        Data Reviewed:  Basic Metabolic Panel: Recent Labs  Lab 09/18/23 1509 09/19/23 0528  NA 136 133*  K 4.0 4.3  CL 97* 97*  CO2 24 24  GLUCOSE 172* 123*  BUN 13 13  CREATININE 0.77 0.81  CALCIUM 9.6 9.0  CBC: Recent Labs  Lab 09/18/23 1509  WBC 10.9*  HGB 15.9  HCT 48.7  MCV 92.9  PLT 148*    LFT Recent Labs  Lab 09/18/23 1509 09/19/23 0528  AST 411* 321*  ALT 270* 762*  ALKPHOS 123 125  BILITOT 3.1* 4.7*  PROT 7.1 6.7  ALBUMIN 4.2 3.6     Antibiotics: Anti-infectives (From admission, onward)    None        DVT prophylaxis: Heparin  Code Status: Full code  Family Communication:    CONSULTS gastroenterology   Subjective   Denies abdominal pain.  Tolerating clear liquid diet.   Objective    Physical Examination:   General-appears in no acute distress Heart-S1-S2, regular, no murmur auscultated Lungs-clear to  auscultation bilaterally, no wheezing or crackles auscultated Abdomen-soft, nontender, no organomegaly Extremities-no edema in the lower extremities Neuro-alert, oriented x3, no focal deficit noted  Status is: Inpatient:             Joel Nelson   [facility] If 7PM-7AM, please contact night-coverage at [email or web address], Office  [phone]   09/19/2023, 8:12 AM  LOS: 1 day

## 2023-09-20 ENCOUNTER — Inpatient Hospital Stay (HOSPITAL_COMMUNITY)

## 2023-09-20 DIAGNOSIS — K859 Acute pancreatitis without necrosis or infection, unspecified: Secondary | ICD-10-CM | POA: Diagnosis not present

## 2023-09-20 DIAGNOSIS — R748 Abnormal levels of other serum enzymes: Secondary | ICD-10-CM | POA: Diagnosis not present

## 2023-09-20 DIAGNOSIS — K8000 Calculus of gallbladder with acute cholecystitis without obstruction: Secondary | ICD-10-CM | POA: Diagnosis not present

## 2023-09-20 DIAGNOSIS — R7989 Other specified abnormal findings of blood chemistry: Secondary | ICD-10-CM | POA: Diagnosis not present

## 2023-09-20 DIAGNOSIS — K851 Biliary acute pancreatitis without necrosis or infection: Secondary | ICD-10-CM | POA: Diagnosis not present

## 2023-09-20 DIAGNOSIS — I10 Essential (primary) hypertension: Secondary | ICD-10-CM | POA: Diagnosis not present

## 2023-09-20 LAB — HEPATIC FUNCTION PANEL
ALT: 182 U/L — ABNORMAL HIGH (ref 0–44)
AST: 83 U/L — ABNORMAL HIGH (ref 15–41)
Albumin: 3.3 g/dL — ABNORMAL LOW (ref 3.5–5.0)
Alkaline Phosphatase: 133 U/L — ABNORMAL HIGH (ref 38–126)
Bilirubin, Direct: 1.1 mg/dL — ABNORMAL HIGH (ref 0.0–0.2)
Indirect Bilirubin: 1.4 mg/dL — ABNORMAL HIGH (ref 0.3–0.9)
Total Bilirubin: 2.5 mg/dL — ABNORMAL HIGH (ref 0.0–1.2)
Total Protein: 6.6 g/dL (ref 6.5–8.1)

## 2023-09-20 MED ORDER — MELATONIN 5 MG PO TABS
5.0000 mg | ORAL_TABLET | Freq: Once | ORAL | Status: AC
  Administered 2023-09-20: 5 mg via ORAL
  Filled 2023-09-20: qty 1

## 2023-09-20 MED ORDER — PIPERACILLIN-TAZOBACTAM 3.375 G IVPB
3.3750 g | Freq: Three times a day (TID) | INTRAVENOUS | Status: DC
  Administered 2023-09-20 – 2023-09-25 (×15): 3.375 g via INTRAVENOUS
  Filled 2023-09-20 (×16): qty 50

## 2023-09-20 MED ORDER — GADOBUTROL 1 MMOL/ML IV SOLN
8.0000 mL | Freq: Once | INTRAVENOUS | Status: AC | PRN
  Administered 2023-09-20: 8 mL via INTRAVENOUS

## 2023-09-20 MED ORDER — HALOPERIDOL LACTATE 5 MG/ML IJ SOLN: 2.0000 mg | Freq: Once | INTRAMUSCULAR | Status: DC

## 2023-09-20 NOTE — Progress Notes (Signed)
 [facility]  PROGRESS NOTE  Eri Platten ZOX:[id number] DOB: [birth year] DOA: 09/18/2023 PCP: Estanislado Pandy, MD   Brief HPI:   89 y.o. male, with past medical history of hypertension, hyperlipidemia, CAD, dementia, OSA on BiPAP, sick sinus syndrome status post pacemaker. - Was brought to ED by his wife secondary to complaints of abdominal pain, nausea, and distention patient with dementia, poor historian, it was obtained by wife and daughter-in-law at bedside.  Patient with significant pain started yesterday, resolved after some belching, again this morning, he is complaining of more abdominal pain, nausea, as well family report diarrhea earlier this morning.  Prompted them to bring him to the ED. - ED his workup significant for elevated lipase at 3000 range, AST of 400 range, ALT at 270, total bili elevated at 3.1 with direct bili of 1.8, white blood cell count at 10.9, UA is negative, alcohol level is negative, CT abdomen pelvis significant for pancreatitis, hepatic steatosis, with severe prostatomegaly, mild hepatic steatosis, no radiopaque stones or wall thickening of the gallbladder, no intrahepatic or extrahepatic biliary ductal dilation, portal veins are patent.    Assessment/Plan:   Acute pancreatitis Transaminitis -Unclear etiology; lipase was elevated at 2992, has improved to 539 -LFTs are getting worse with ALT 762, total bili 4.7 -Discussed with GI, Dr. Tasia Catchings, at [facility] who recommended  transfer to [facility] for MRCP and possible ERCP -[facility] consulted, plan for MRCP today - Cleared by EP cardiology for MRCP  ?  Acute cholecystitis -Abdominal ultrasound showed gallbladder sludge with mild gallbladder wall thickening and trace pericholecystic fluid.  Negative sonographic sign, acute focal findings. -Also showed hepatic steatosis -Clinically does not appear to have cholecystitis   CAD status post PTCA of the circumflex in 1995.  LVEF 55 to 60% by echocardiogram in 2017.  -  Denies any chest pain. -Will hold aspirin for now pending his further workup to ensure no further workup is anticipated . - Hold Zetia and statin due to transaminitis   Hyperlipidemia  - Hold Zetia and statin due to elevated LFTs.   Sick sinus syndrome/sinus bradycardia - Status post permanent pacemaker - Monitor on telemetry   OSA. - Continue with BiPAP   Hypertension - Continue with Cozaar 25 mg daily, continue with as needed hydralazine   Dementia - Continue with home medications     Medications     cyanocobalamin  1,000 mcg Oral q morning   haloperidol lactate  2 mg Intravenous Once   heparin  5,000 Units Subcutaneous Q8H   losartan  25 mg Oral Daily   memantine  10 mg Oral Daily   multivitamin with minerals  1 tablet Oral Daily   pantoprazole  40 mg Oral Daily   QUEtiapine  25 mg Oral QHS   rivastigmine  9.5 mg Transdermal Daily   sertraline  25 mg Oral QHS   tamsulosin  0.4 mg Oral Daily     Data Reviewed:   CBG:  No results for input(s): "GLUCAP" in the last 168 hours.  SpO2: 97 %    Vitals:   09/19/23 1403 09/19/23 2100 09/19/23 2110 09/20/23 0903  BP: 105/70 136/72  (!) 145/64  Pulse: 61 74 78 71  Resp: 16  16 18   Temp: 98.2 F (36.8 C) 98.4 F (36.9 C)  97.7 F (36.5 C)  TempSrc: Oral Oral  Oral  SpO2: 99% 97% 97% 97%  Weight:      Height:  Data Reviewed:  Basic Metabolic Panel: Recent Labs  Lab 09/18/23 1509 09/19/23 0528  NA 136 133*  K 4.0 4.3  CL 97* 97*  CO2 24 24  GLUCOSE 172* 123*  BUN 13 13  CREATININE 0.77 0.81  CALCIUM 9.6 9.0    CBC: Recent Labs  Lab 09/18/23 1509 09/19/23 0815  WBC 10.9* 12.1*  HGB 15.9 14.6  HCT 48.7 44.5  MCV 92.9 90.8  PLT 148* 190    LFT Recent Labs  Lab 09/18/23 1509 09/19/23 0528  AST 411* 321*  ALT 270* 762*  ALKPHOS 123 125  BILITOT 3.1* 4.7*  PROT 7.1 6.7  ALBUMIN 4.2 3.6     Antibiotics: Anti-infectives (From admission, onward)    Start     Dose/Rate  Route Frequency Ordered Stop   09/20/23 0600  piperacillin-tazobactam (ZOSYN) IVPB 3.375 g        3.375 g 12.5 mL/hr over 240 Minutes Intravenous Every 8 hours 09/20/23 0129     09/19/23 2000  piperacillin-tazobactam (ZOSYN) IVPB 3.375 g  Status:  Discontinued        3.375 g 12.5 mL/hr over 240 Minutes Intravenous Every 8 hours 09/19/23 1030 09/20/23 0129   09/19/23 1130  piperacillin-tazobactam (ZOSYN) IVPB 3.375 g        3.375 g 100 mL/hr over 30 Minutes Intravenous  Once 09/19/23 1030 09/19/23 1224        DVT prophylaxis: Heparin  Code Status: Full code  Family Communication: Family at bedside   CONSULTS gastroenterology   Subjective   Denies pain.   Objective    Physical Examination:  General-appears in no acute distress Heart-S1-S2, regular, no murmur auscultated Lungs-clear to auscultation bilaterally, no wheezing or crackles auscultated Abdomen-soft, nontender, no organomegaly Extremities-no edema in the lower extremities Neuro-alert, oriented x3, no focal deficit noted  Status is: Inpatient:             Ozell Blunt   [facility] If 7PM-7AM, please contact night-coverage at [email or web address], Office  [phone]   09/20/2023, 11:55 AM  LOS: 2 days

## 2023-09-20 NOTE — Consult Note (Signed)
 Consultation Note   Referring Provider:  [facility] PCP: Orest Bio, MD Primary Gastroenterologist::   Unassigned       Reason for Consultation:  Acute pancreatitis / Elevated LFTs DOA: 09/18/2023         Hospital Day: 3   ASSESSMENT    89 y.o. year old male with a medical history including but not limited to hypertension, CAD,  OSA on BiPAP, SSS status post pacemaker. Transferred from [facility] yesterday for MRCP  Acute pancreatitis  / elevated LFTs without cholelithiasis or  biliary duct dilation on US  or CT scan.   S/p IVF boluses. HCT 44.5% yesterday. No labs drawn today Biliary pancreatitis still a consideration. Medication induced pancreatitis and / or hepatic injury?   No Etoh use. Triglycerides normal. Ca+ normal.   Possible acute cholecystitis. Gallbladder sludge with mild gallbladder wall thickening and trace pericholecystic fluid.   GERD, controlled with Pantoprazole at home  Dementia  See PMH for additional history     PLAN:   --Awaiting MRI / MRCP. Pacemaker is apparently "conditional" and okay to have MRI but I'm told we are waiting on Cardiology to weigh in " to check leads" and also Pacemaker Rep to come and put in MRI mode. I will reach out to [facility] to try and get more information.  --Continue Zosyn --Continue maintenance IV fluids --Continue Clear liquids --Will obtain LFTs for today and in am.   HPI   Patient transferred from [facility] where he presented with abdominal pain, nausea since Friday. Patient didn't sleep much last night. Falling asleep during my visit. History comes from family and chart. ED workup significant for lipase of 2992  AST in the 400 range, ALT 270, total bilirubin 3.1, direct bilirubin 1.8, normal alkaline phosphatase WBC 10.9  RUQ ultrasound 1. Gallbladder sludge with mild gallbladder wall thickening and trace pericholecystic fluid. Negative sonographic versus  sign. Equivocal findings. Correlate clinically for acute cholecystitis. 2. Hepatic steatosis. Please note limited evaluation for focal hepatic masses in a patient with hepatic steatosis due to decreased penetration of the acoustic ultrasound waves.  CT AP with contrast  1. Findings consistent with acute interstitial edematous pancreatitis. Correlation with serum lipase recommended. Underlying findings of chronic pancreatitis also noted. No well-formed or drainable peripancreatic fluid collection. 2. Descending and sigmoid colonic diverticulosis. No changes of acute diverticulitis. 3. Hepatic steatosis. 4. Severe prostatomegaly.  Most recent labs drawn yesterday morning show rising bilirubin to 4.7, ALT up from 270 to 752.   Plan was for MRCP to be done once patient got to [facility].  However due to patient's pacemaker there has been delay in getting the MRCP done.  Our team clarified with radiology what needs to happen in order for the MRCP to be done.  Per our weekend GI team:    MRI at [facility] was able to look up his model, and for this patient it appears that his pacemaker is conditional-he will be able to have MRI/MRCP. Will need cardiology review to make sure leads are appropriate etc., and pacemaker rep needs to come and put the pacemaker in MRI mode.  Wife gives a history of GERD and constipation.   Previous GI Studies   Remote colonoscopy in  [place]   Labs and Imaging:  Recent Labs    09/18/23 1509 09/19/23 0815  WBC 10.9* 12.1*  HGB 15.9 14.6  HCT 48.7 44.5  MCV 92.9 90.8  PLT 148* 190   No results for input(s): "FOLATE", "VITAMINB12", "FERRITIN", "TIBC", "IRONPCTSAT" in the last 72 hours. Recent Labs    09/18/23 1509 09/19/23 0528  NA 136 133*  K 4.0 4.3  CL 97* 97*  CO2 24 24  GLUCOSE 172* 123*  BUN 13 13  CREATININE 0.77 0.81  CALCIUM 9.6 9.0   Recent Labs    09/18/23 1509 09/19/23 0528  PROT 7.1 6.7  ALBUMIN 4.2 3.6  AST 411* 321*  ALT 270* 762*   ALKPHOS 123 125  BILITOT 3.1* 4.7*  BILIDIR 1.8* 3.0*  IBILI  --  1.7*      Past Medical History:  Diagnosis Date   Coronary artery disease 1995    remote left circumflex angioplasty without stent placement in 1995   Dementia (HCC)    Excessive daytime sleepiness 11/22/2017   History of kidney stones    History of shingles    Hyperlipidemia    Hypertension    Melanoma (HCC)    Memory loss    OSA treated with BiPAP    moderate obstructive sleep apnea with an AHI of 18.7/h and mild central sleep apnea with his CAI of 5.4/h.On BiPAP at 9/5cm H2O   Pre-diabetes    Presence of permanent cardiac pacemaker 03/24/2016   Sick sinus syndrome (HCC) 03/24/2016   Syncope 03/24/2016    Past Surgical History:  Procedure Laterality Date   EP IMPLANTABLE DEVICE N/A 03/24/2016   SJM Assurity MRI PPM implanted by Dr Johney Frame for sick sinus syndrome   herniated disk repair      Family History  Problem Relation Age of Onset   Alzheimer's disease Mother    Lung cancer Sister     Prior to Admission medications   Medication Sig Start Date End Date Taking? Authorizing Provider  aspirin EC 81 MG tablet Take 162 mg by mouth daily.   Yes [provider]  cyanocobalamin (VITAMIN B12) 1000 MCG tablet Take 1,000 mcg by mouth every morning.   Yes [provider]  ezetimibe (ZETIA) 10 MG tablet Take 10 mg by mouth daily. 12/01/15  Yes [provider]  fluticasone (FLONASE) 50 MCG/ACT nasal spray Place 1 spray into both nostrils daily as needed for allergies.   Yes [provider]  losartan (COZAAR) 25 MG tablet Take 25 mg by mouth daily. 12/22/13  Yes [provider]  memantine (NAMENDA) 10 MG tablet Take 10 mg by mouth daily.   Yes [provider]  Multiple Vitamin (MULTI-VITAMINS) TABS Take 1 tablet by mouth daily.   Yes [provider]  nitroGLYCERIN (NITROSTAT) 0.4 MG SL tablet Place 0.4 mg under the tongue every 5 (five) minutes as  needed for chest pain.  08/11/10  Yes [provider]  pantoprazole (PROTONIX) 40 MG tablet Take 40 mg by mouth daily. 09/18/23  Yes [provider]  Polyethyl Glycol-Propyl Glycol (SYSTANE) 0.4-0.3 % SOLN Apply 1 drop to eye daily as needed (for dry eye).   Yes [provider]  rivastigmine (EXELON) 9.5 mg/24hr Place 9.5 mg onto the skin daily.   Yes [provider]  rosuvastatin (CRESTOR) 5 MG tablet Take 5 mg by mouth daily.   Yes [provider]  sertraline (ZOLOFT) 50 MG tablet Take 25 mg by mouth at bedtime.  Yes [provider]  tamsulosin (FLOMAX) 0.4 MG CAPS capsule Take 0.4 mg by mouth daily. 11/21/15  Yes [provider]  acetaminophen (TYLENOL) 500 MG tablet Take 1 tablet (500 mg total) by mouth every 6 (six) hours as needed. 08/23/23   Fayrene Helper, PA-C    Current Facility-Administered Medications  Medication Dose Route Frequency Provider Last Rate Last Admin   0.9 %  sodium chloride infusion   Intravenous Continuous Meredeth Ide, MD   Stopped at 09/19/23 1154   alum & mag hydroxide-simeth (MAALOX/MYLANTA) 200-200-20 MG/5ML suspension 30 mL  30 mL Oral Q4H PRN Meredeth Ide, MD   30 mL at 09/18/23 2233   cyanocobalamin (VITAMIN B12) tablet 1,000 mcg  1,000 mcg Oral q morning Meredeth Ide, MD   1,000 mcg at 09/19/23 1610   haloperidol lactate (HALDOL) injection 2 mg  2 mg Intravenous Once Luiz Iron, NP       heparin injection 5,000 Units  5,000 Units Subcutaneous Q8H Meredeth Ide, MD   5,000 Units at 09/20/23 0550   hydrALAZINE (APRESOLINE) injection 5 mg  5 mg Intravenous Q4H PRN Meredeth Ide, MD       losartan (COZAAR) tablet 25 mg  25 mg Oral Daily Meredeth Ide, MD   25 mg at 09/19/23 0911   memantine (NAMENDA) tablet 10 mg  10 mg Oral Daily Meredeth Ide, MD   10 mg at 09/19/23 0912   morphine (PF) 2 MG/ML injection 2 mg  2 mg Intravenous Q4H PRN Meredeth Ide, MD       multivitamin with minerals tablet 1  tablet  1 tablet Oral Daily Meredeth Ide, MD   1 tablet at 09/19/23 0911   ondansetron (ZOFRAN) tablet 4 mg  4 mg Oral Q6H PRN Meredeth Ide, MD       Or   ondansetron (ZOFRAN) injection 4 mg  4 mg Intravenous Q6H PRN Meredeth Ide, MD       oxyCODONE (Oxy IR/ROXICODONE) immediate release tablet 5 mg  5 mg Oral Q4H PRN Meredeth Ide, MD   5 mg at 09/18/23 2232   pantoprazole (PROTONIX) EC tablet 40 mg  40 mg Oral Daily Meredeth Ide, MD   40 mg at 09/19/23 0911   piperacillin-tazobactam (ZOSYN) IVPB 3.375 g  3.375 g Intravenous Q8H Meredeth Ide, MD 12.5 mL/hr at 09/20/23 0549 3.375 g at 09/20/23 0549   QUEtiapine (SEROQUEL) tablet 25 mg  25 mg Oral QHS Meredeth Ide, MD   25 mg at 09/19/23 2205   rivastigmine (EXELON) 9.5 mg/24hr 9.5 mg  9.5 mg Transdermal Daily Meredeth Ide, MD       sertraline (ZOLOFT) tablet 25 mg  25 mg Oral QHS Meredeth Ide, MD   25 mg at 09/19/23 2205   tamsulosin (FLOMAX) capsule 0.4 mg  0.4 mg Oral Daily Meredeth Ide, MD   0.4 mg at 09/19/23 2206    Allergies as of 09/18/2023   (No Known Allergies)    Social History   Socioeconomic History   Marital status: Married    Spouse name: Not on file   Number of children: Not on file   Years of education: Not on file   Highest education level: Not on file  Occupational History   Not on file  Tobacco Use   Smoking status: Never   Smokeless tobacco: Never  Vaping Use   Vaping status: Never Used  Substance and Sexual Activity   Alcohol use: No   Drug use: No   Sexual activity: Yes    Partners: Female    Comment: married  Other Topics Concern   Not on file  Social History Narrative   Not on file   Social Drivers of Health   Financial Resource Strain: Not on file  Food Insecurity: No Food Insecurity (09/18/2023)   Hunger Vital Sign    Worried About Running Out of Food in the Last Year: Never true    Ran Out of Food in the Last Year: Never true  Transportation Needs: No Transportation Needs (09/18/2023)    PRAPARE - Administrator, Civil Service (Medical): No    Lack of Transportation (Non-Medical): No  Physical Activity: Not on file  Stress: Not on file  Social Connections: Socially Integrated (09/18/2023)   Social Connection and Isolation Panel [NHANES]    Frequency of Communication with Friends and Family: More than three times a week    Frequency of Social Gatherings with Friends and Family: Three times a week    Attends Religious Services: 1 to 4 times per year    Active Member of Clubs or Organizations: Yes    Attends Banker Meetings: 1 to 4 times per year    Marital Status: Married  Catering manager Violence: Not At Risk (09/18/2023)   Humiliation, Afraid, Rape, and Kick questionnaire    Fear of Current or Ex-Partner: No    Emotionally Abused: No    Physically Abused: No    Sexually Abused: No     Code Status   Code Status: Full Code  Review of Systems: All systems reviewed and negative except where noted in HPI.  Physical Exam: Vital signs in last 24 hours: Temp:  [97.7 F (36.5 C)-98.4 F (36.9 C)] 97.7 F (36.5 C) (04/14 0903) Pulse Rate:  [61-78] 71 (04/14 0903) Resp:  [16-18] 18 (04/14 0903) BP: (105-157)/(64-72) 145/64 (04/14 0903) SpO2:  [95 %-99 %] 97 % (04/14 0903) Last BM Date : 09/19/23  General:  Male asleep in bed in NAD. Easily awoken but falls back to sleep Ears:  Normal auditory acuity Nose: No deformity, discharge or lesions Neck:  Supple, no masses felt Lungs:  Clear to auscultation.  Heart:  Regular rate, irregular rhythm.  Abdomen:  Soft, nondistended, nontender on exam, active bowel sounds, no masses felt Rectal :  Deferred Msk: Symmetrical without gross deformities.  Neurologic:  Alert, oriented, grossly normal neurologically Extremities : No edema Skin:  Intact without significant lesions.    Intake/Output from previous day: 04/13 0701 - 04/14 0700 In: 1579.3 [P.O.:120; I.V.:1452.1; IV Piggyback:7.2] Out: -   Intake/Output this shift:  No intake/output data recorded.   Mai Schwalbe, NP-C   09/20/2023, 9:43 AM

## 2023-09-20 NOTE — Progress Notes (Signed)
 Patient states that he does not wear CPAP or BiPap at home and he doesn't want to wear tonight. Patient is in no distress, appears to be resting comfortably.

## 2023-09-20 NOTE — Progress Notes (Signed)
 PT Cancellation Note  Patient Details Name: Joel Nelson MRN: [id number] DOB: April 17, 1934   Cancelled Treatment:    Reason Eval/Treat Not Completed: Patient at procedure or test/unavailable (MRI)  Verdia Glad, PT, DPT Acute Rehabilitation Services Office [phone]    Claria Crofts 09/20/2023, 1:17 PM

## 2023-09-20 NOTE — Progress Notes (Signed)
 Per MRI staff, SWOT RN already waiting at MRI to stay with patient during MRI.

## 2023-09-21 DIAGNOSIS — K859 Acute pancreatitis without necrosis or infection, unspecified: Secondary | ICD-10-CM | POA: Diagnosis not present

## 2023-09-21 DIAGNOSIS — R748 Abnormal levels of other serum enzymes: Secondary | ICD-10-CM | POA: Diagnosis not present

## 2023-09-21 DIAGNOSIS — K851 Biliary acute pancreatitis without necrosis or infection: Secondary | ICD-10-CM

## 2023-09-21 DIAGNOSIS — I10 Essential (primary) hypertension: Secondary | ICD-10-CM | POA: Diagnosis not present

## 2023-09-21 DIAGNOSIS — K8020 Calculus of gallbladder without cholecystitis without obstruction: Secondary | ICD-10-CM | POA: Diagnosis not present

## 2023-09-21 DIAGNOSIS — K863 Pseudocyst of pancreas: Secondary | ICD-10-CM

## 2023-09-21 DIAGNOSIS — K8000 Calculus of gallbladder with acute cholecystitis without obstruction: Secondary | ICD-10-CM | POA: Diagnosis not present

## 2023-09-21 DIAGNOSIS — R7989 Other specified abnormal findings of blood chemistry: Secondary | ICD-10-CM | POA: Diagnosis not present

## 2023-09-21 LAB — COMPREHENSIVE METABOLIC PANEL WITH GFR
ALT: 154 U/L — ABNORMAL HIGH (ref 0–44)
ALT: 127 U/L — ABNORMAL HIGH (ref 0–44)
AST: 66 U/L — ABNORMAL HIGH (ref 15–41)
AST: 50 U/L — ABNORMAL HIGH (ref 15–41)
Albumin: 3.1 g/dL — ABNORMAL LOW (ref 3.5–5.0)
Albumin: 3 g/dL — ABNORMAL LOW (ref 3.5–5.0)
Alkaline Phosphatase: 159 U/L — ABNORMAL HIGH (ref 38–126)
Alkaline Phosphatase: 166 U/L — ABNORMAL HIGH (ref 38–126)
Anion gap: 13 (ref 5–15)
Anion gap: 10 (ref 5–15)
BUN: 13 mg/dL (ref 8–23)
BUN: 12 mg/dL (ref 8–23)
CO2: 20 mmol/L — ABNORMAL LOW (ref 22–32)
CO2: 25 mmol/L (ref 22–32)
Calcium: 8.5 mg/dL — ABNORMAL LOW (ref 8.9–10.3)
Calcium: 8.3 mg/dL — ABNORMAL LOW (ref 8.9–10.3)
Chloride: 100 mmol/L (ref 98–111)
Chloride: 99 mmol/L (ref 98–111)
Creatinine, Ser: 0.94 mg/dL (ref 0.61–1.24)
Creatinine, Ser: 0.86 mg/dL (ref 0.61–1.24)
GFR, Estimated: >60 mL/min (ref >60)
GFR, Estimated: >60 mL/min (ref >60)
Glucose, Bld: 110 mg/dL — ABNORMAL HIGH (ref 70–99)
Glucose, Bld: 85 mg/dL (ref 70–99)
Potassium: 3.9 mmol/L (ref 3.5–5.1)
Potassium: 3.7 mmol/L (ref 3.5–5.1)
Sodium: 133 mmol/L — ABNORMAL LOW (ref 135–145)
Sodium: 134 mmol/L — ABNORMAL LOW (ref 135–145)
Total Bilirubin: 2.8 mg/dL — ABNORMAL HIGH (ref 0.0–1.2)
Total Bilirubin: 2 mg/dL — ABNORMAL HIGH (ref 0.0–1.2)
Total Protein: 6.3 g/dL — ABNORMAL LOW (ref 6.5–8.1)
Total Protein: 6.1 g/dL — ABNORMAL LOW (ref 6.5–8.1)

## 2023-09-21 NOTE — Plan of Care (Signed)
  Problem: Clinical Measurements: Goal: Ability to maintain clinical measurements within normal limits will improve Outcome: Progressing   Problem: Health Behavior/Discharge Planning: Goal: Ability to manage health-related needs will improve Outcome: Progressing   

## 2023-09-21 NOTE — Consult Note (Signed)
 Consult Note  Jaykwon Morones 19-May-1934  [id number].    Requesting MD: Mauro Kaufmann, MD Chief Complaint/Reason for Consult: Biliary pancreatitis, possible cholecystitis   HPI:  Patient is an 89 year old male who presented to [facility] on 09/18/23 with abdominal pain, nausea, jaundice and dark urine since the day prior. ED workup there was significant for lipase of almost 3000, AST in the 400 range, ALT 270 and Tbili 3.1 with normal Alk Phos and no leukocytosis. Found to have acute pancreatitis with concern for biliary etiology. RUQ [place] did also show some mild gallbladder wall thickening and trace pericholecystic fluid. He was transferred to [facility] for MRCP given his elevated LFT's. This was done yesterday and did not show choledocholithiasis but did show cholelithiasis without evidence of acute cholecystitis, hepatic steatosis, several side branch cystic lesions in the body and tail of the pancreas favoring pseudocysts v. Side branch IPMN. General surgery consulted for consideration of cholecystectomy.   PMH otherwise significant for CAD, HTN, HLD, SSS s/p PPM, OSA on BiPAP, Dementia, GERD, Pre-diabetes, Hx of melanoma and shingles. No prior abdominal surgery. Not on any blood thinners. NKDA.   Today he has no abdominal pain. Currently npo but was tolerating liquids yesterday w/o abdominal pain, n/v.   ROS: ROS As above, see hpi  Family History  Problem Relation Age of Onset   Alzheimer's disease Mother    Lung cancer Sister     Past Medical History:  Diagnosis Date   Coronary artery disease 1995    remote left circumflex angioplasty without stent placement in 1995   Dementia (HCC)    Excessive daytime sleepiness 11/22/2017   History of kidney stones    History of shingles    Hyperlipidemia    Hypertension    Melanoma (HCC)    Memory loss    OSA treated with BiPAP    moderate obstructive sleep apnea with an AHI of 18.7/h and mild central sleep apnea with his CAI of  5.4/h.On BiPAP at 9/5cm H2O   Pre-diabetes    Presence of permanent cardiac pacemaker 03/24/2016   Sick sinus syndrome (HCC) 03/24/2016   Syncope 03/24/2016    Past Surgical History:  Procedure Laterality Date   EP IMPLANTABLE DEVICE N/A 03/24/2016   SJM Assurity MRI PPM implanted by Dr Johney Frame for sick sinus syndrome   herniated disk repair      Social History:  reports that he has never smoked. He has never used smokeless tobacco. He reports that he does not drink alcohol and does not use drugs.  Allergies: No Known Allergies  Medications Prior to Admission  Medication Sig Dispense Refill   aspirin EC 81 MG tablet Take 162 mg by mouth daily.     cyanocobalamin (VITAMIN B12) 1000 MCG tablet Take 1,000 mcg by mouth every morning.     ezetimibe (ZETIA) 10 MG tablet Take 10 mg by mouth daily.     fluticasone (FLONASE) 50 MCG/ACT nasal spray Place 1 spray into both nostrils daily as needed for allergies.     losartan (COZAAR) 25 MG tablet Take 25 mg by mouth daily.     memantine (NAMENDA) 10 MG tablet Take 10 mg by mouth daily.     Multiple Vitamin (MULTI-VITAMINS) TABS Take 1 tablet by mouth daily.     nitroGLYCERIN (NITROSTAT) 0.4 MG SL tablet Place 0.4 mg under the tongue every 5 (five) minutes as needed for chest pain.      pantoprazole (PROTONIX) 40 MG  tablet Take 40 mg by mouth daily.     Polyethyl Glycol-Propyl Glycol (SYSTANE) 0.4-0.3 % SOLN Apply 1 drop to eye daily as needed (for dry eye).     rivastigmine (EXELON) 9.5 mg/24hr Place 9.5 mg onto the skin daily.     rosuvastatin (CRESTOR) 5 MG tablet Take 5 mg by mouth daily.     sertraline (ZOLOFT) 50 MG tablet Take 25 mg by mouth at bedtime.     tamsulosin (FLOMAX) 0.4 MG CAPS capsule Take 0.4 mg by mouth daily.     acetaminophen (TYLENOL) 500 MG tablet Take 1 tablet (500 mg total) by mouth every 6 (six) hours as needed. 30 tablet 0    Blood pressure (!) 148/78, pulse 70, temperature 98.1 F (36.7 C), temperature source  Oral, resp. rate 17, height 5\' 8"  (1.727 m), weight 78.9 kg, SpO2 99%. Physical Exam:  General: pleasant, WD, elderly male who is laying in bed in NAD HEENT: head is normocephalic, atraumatic.  Sclera are anicteric.  Heart: regular, rate, and rhythm.  Normal s1,s2. No obvious murmurs, gallops, or rubs noted.  Palpable radial and pedal pulses bilaterally Lungs: CTAB, no wheezes, rhonchi, or rales noted.  Respiratory effort nonlabored Abd: soft, he does have generalized ttp on palpation that is greatest in the epigastrium and ruq without rigidity or guarding.ND, +BS, no masses, hernias, or organomegaly MS: No LE edema Skin: warm and dry  Neuro: Cranial nerves 2-12 grossly intact, MAE's  Psych: A&Ox3 with an appropriate affect.   Results for orders placed or performed during the hospital encounter of 09/18/23 (from the past 48 hours)  Hepatic function panel     Status: Abnormal   Collection Time: 09/20/23  8:01 PM  Result Value Ref Range   Total Protein 6.6 6.5 - 8.1 g/dL   Albumin 3.3 (L) 3.5 - 5.0 g/dL   AST 83 (H) 15 - 41 U/L   ALT 182 (H) 0 - 44 U/L   Alkaline Phosphatase 133 (H) 38 - 126 U/L   Total Bilirubin 2.5 (H) 0.0 - 1.2 mg/dL   Bilirubin, Direct 1.1 (H) 0.0 - 0.2 mg/dL   Indirect Bilirubin 1.4 (H) 0.3 - 0.9 mg/dL    Comment: Performed at [facility], [address], [place], [place] [address]  Comprehensive metabolic panel     Status: Abnormal   Collection Time: 09/21/23  3:19 AM  Result Value Ref Range   Sodium 133 (L) 135 - 145 mmol/L   Potassium 3.9 3.5 - 5.1 mmol/L   Chloride 100 98 - 111 mmol/L   CO2 20 (L) 22 - 32 mmol/L   Glucose, Bld 110 (H) 70 - 99 mg/dL    Comment: Glucose reference range applies only to samples taken after fasting for at least 8 hours.   BUN 13 8 - 23 mg/dL   Creatinine, Ser 5.78 0.61 - 1.24 mg/dL   Calcium 8.5 (L) 8.9 - 10.3 mg/dL   Total Protein 6.3 (L) 6.5 - 8.1 g/dL   Albumin 3.1 (L) 3.5 - 5.0 g/dL   AST 66 (H) 15 - 41 U/L   ALT  154 (H) 0 - 44 U/L   Alkaline Phosphatase 159 (H) 38 - 126 U/L   Total Bilirubin 2.8 (H) 0.0 - 1.2 mg/dL   GFR, Estimated >46 >96 mL/min    Comment: (NOTE) Calculated using the CKD-EPI Creatinine Equation (2021)    Anion gap 13 5 - 15    Comment: Performed at [facility],  [address], [place], [place] [address]   MR ABDOMEN MRCP W WO CONTAST Result Date: 09/20/2023 CLINICAL DATA:  Acute pancreatitis.  Elevated liver enzymes EXAM: MRI ABDOMEN WITHOUT AND WITH CONTRAST (INCLUDING MRCP) TECHNIQUE: Multiplanar multisequence MR imaging of the abdomen was performed both before and after the administration of intravenous contrast. Heavily T2-weighted images of the biliary and pancreatic ducts were obtained, and three-dimensional MRCP images were rendered by post processing. CONTRAST:  8mL GADAVIST GADOBUTROL 1 MMOL/ML IV SOLN COMPARISON:  CT 09/18/2023, ultrasound 09/19/2023 FINDINGS: Lower chest:  Lung bases are clear. Hepatobiliary: There is no intrahepatic biliary duct dilatation. Common hepatic duct and common bile duct normal caliber. No choledocholithiasis. Small amount sludge and stones collect towards the neck of the gallbladder (image 27/series 9). No gallbladder wall thickening or pericholecystic fluid. Opposed phase imaging demonstrates marked hepatic steatosis (series 501 and 502)) Pancreas: Pancreatic inflammation better appreciated on CT. No pancreatic duct dilatation. No variant ductal anatomy. There are several side branch cystic lesions along the body and tail the pancreas. For example 8 mm cystic lesion on image 24/9. Two small cystic lesions measuring 6 mm on image 24/9. Potential cyst in the tail the pancreas measuring 11 mm. There is small amount of fluid along the LEFT anterior pararenal fascia related pancreatitis. No organized fluid collections. Postcontrast T1 images are severely degraded respiratory motion which is typical in patients with active pancreatitis. Spleen: Normal  spleen. Adrenals/urinary tract: Adrenal glands and kidneys are normal. Stomach/Bowel: Stomach and limited of the small bowel is unremarkable Vascular/Lymphatic: Abdominal aortic normal caliber. No retroperitoneal periportal lymphadenopathy. Musculoskeletal: No aggressive osseous lesion IMPRESSION: 1. No choledocholithiasis. No biliary duct dilatation. 2. Small amount of sludge and stones in the gallbladder. No evidence acute cholecystitis. 3. Marked hepatic steatosis. 4. Acute pancreatitis better appreciated on CT. No organized collections. 5. Several side branch cystic lesions in the body and tail the pancreas. Favor pseudocysts versus side branch IPMN. Recommend follow-up MRI in 2 years. Electronically Signed   By: Deboraha Fallow M.D.   On: 09/20/2023 18:34   MR 3D Recon At Scanner Result Date: 09/20/2023 CLINICAL DATA:  Acute pancreatitis.  Elevated liver enzymes EXAM: MRI ABDOMEN WITHOUT AND WITH CONTRAST (INCLUDING MRCP) TECHNIQUE: Multiplanar multisequence MR imaging of the abdomen was performed both before and after the administration of intravenous contrast. Heavily T2-weighted images of the biliary and pancreatic ducts were obtained, and three-dimensional MRCP images were rendered by post processing. CONTRAST:  8mL GADAVIST GADOBUTROL 1 MMOL/ML IV SOLN COMPARISON:  CT 09/18/2023, ultrasound 09/19/2023 FINDINGS: Lower chest:  Lung bases are clear. Hepatobiliary: There is no intrahepatic biliary duct dilatation. Common hepatic duct and common bile duct normal caliber. No choledocholithiasis. Small amount sludge and stones collect towards the neck of the gallbladder (image 27/series 9). No gallbladder wall thickening or pericholecystic fluid. Opposed phase imaging demonstrates marked hepatic steatosis (series 501 and 502)) Pancreas: Pancreatic inflammation better appreciated on CT. No pancreatic duct dilatation. No variant ductal anatomy. There are several side branch cystic lesions along the body and  tail the pancreas. For example 8 mm cystic lesion on image 24/9. Two small cystic lesions measuring 6 mm on image 24/9. Potential cyst in the tail the pancreas measuring 11 mm. There is small amount of fluid along the LEFT anterior pararenal fascia related pancreatitis. No organized fluid collections. Postcontrast T1 images are severely degraded respiratory motion which is typical in patients with active pancreatitis. Spleen: Normal spleen. Adrenals/urinary tract: Adrenal glands and kidneys are normal. Stomach/Bowel:  Stomach and limited of the small bowel is unremarkable Vascular/Lymphatic: Abdominal aortic normal caliber. No retroperitoneal periportal lymphadenopathy. Musculoskeletal: No aggressive osseous lesion IMPRESSION: 1. No choledocholithiasis. No biliary duct dilatation. 2. Small amount of sludge and stones in the gallbladder. No evidence acute cholecystitis. 3. Marked hepatic steatosis. 4. Acute pancreatitis better appreciated on CT. No organized collections. 5. Several side branch cystic lesions in the body and tail the pancreas. Favor pseudocysts versus side branch IPMN. Recommend follow-up MRI in 2 years. Electronically Signed   By: Genevive Bi M.D.   On: 09/20/2023 18:34    Anti-infectives (From admission, onward)    Start     Dose/Rate Route Frequency Ordered Stop   09/20/23 0600  piperacillin-tazobactam (ZOSYN) IVPB 3.375 g        3.375 g 12.5 mL/hr over 240 Minutes Intravenous Every 8 hours 09/20/23 0129     09/19/23 2000  piperacillin-tazobactam (ZOSYN) IVPB 3.375 g  Status:  Discontinued        3.375 g 12.5 mL/hr over 240 Minutes Intravenous Every 8 hours 09/19/23 1030 09/20/23 0129   09/19/23 1130  piperacillin-tazobactam (ZOSYN) IVPB 3.375 g        3.375 g 100 mL/hr over 30 Minutes Intravenous  Once 09/19/23 1030 09/19/23 1224        Assessment/Plan Acute biliary pancreatitis - CT 4/12 with acute interstitial edematous pancreatitis  - RUQ [place] 4/13 with gallbladder  sludge and mild gallbladder wall thickening and trace pericholecystic fluid - MRCP 4/14 did not show choledocholithiasis but did show cholelithiasis without evidence of acute cholecystitis, hepatic steatosis, several side branch cystic lesions in the body and tail of the pancreas favoring pseudocysts v. Side branch IPMN - Lipase was 539 4/13, down from 2992 4/12 - Tbili trending down and is 2.8 today, AST/ALT trending down, Alk phos up slightly at 159 - Pt remains ttp on exam today. Not ready for OR today. Would ask TRH to optimize for surgery and see if needs any further w/u before proceeding with general anesthesia. I discussed w/ the pt, his wife, and his 2 sons at bedside today the recommendation for laparoscopic cholecystectomy with ioc given above findings to prevent recurrence of gallstone pancreatitis. We also discussed the option of holding on surgery and the potential risks of recurrent gallstone pancreatitis or other gallbladder issues. I explained the procedure, risks, and aftercare of Laparoscopic cholecystectomy with IOC.  Risks include but are not limited to anesthesia (MI, CVA, death, prolonged intubation and aspiration), bleeding, infection, wound problems, hernia, bile leak, injury to common bile duct/liver/intestine, possible need for subtotal cholecystectomy or open cholecystectomy, increased risk of DVT/PE and diarrhea post op.  The pt wife (pt w/ hx of dementia) seems to understand and agrees to proceed. - Will allow cld today, npo at midnight with repeat labs incase he is ready for OR in the AM.   FEN: ok to have CLD from surgery standpoint VTE: okay for chem ppx from a general surgery standpoint ID: [email or web address] per primary   - per TRH -  CAD HTN HLD SSS s/p PPM OSA on BiPAP Dementia  GERD Pre-diabetes Hx of melanoma  Hx of shingles   I reviewed Consultant GI notes, hospitalist notes, last 24 h vitals and pain scores, last 48 h intake and output, last 24 h labs and trends,  and last 24 h imaging results.  This care required high  level of medical decision making.   Jacinto Halim, [facility] 09/21/2023, 1:56 PM  Please see Amion for pager number during day hours 7:00am-4:30pm

## 2023-09-21 NOTE — Plan of Care (Signed)

## 2023-09-21 NOTE — Evaluation (Signed)
 Physical Therapy Evaluation Patient Details Name: Joel Nelson MRN: [id number] DOB: Oct 15, 1933 Today's Date: 09/21/2023  History of Present Illness  89 yo male presents to [facility] on 4/12 with abdominal pain, nausea, diarrhea. CT abd/pelvis consistent with acute pancreatitis. Pt also with possible acute cholecystitis, awaiting general surgery recommendations. PMH includes SSS s/p PPM, CAD, OSA on BiPAP, dementia, HTN, HLD, preDM.  Clinical Impression   Pt presents with generalized weakness, impaired balance with history of falls, impaired activity tolerance vs anticipated baseline. Pt to benefit from acute PT to address deficits. Pt ambulated good hallway distance with use of RW and close guard for safety, pt initially unsteady but progresses well with continued walking. Pt's family at bedside, state pt is close to baseline. PT does not anticipate follow up PT needs post-acutely. PT to progress mobility as tolerated, and will continue to follow acutely.          If plan is discharge home, recommend the following: A little help with bathing/dressing/bathroom;A little help with walking and/or transfers   Can travel by private vehicle        Equipment Recommendations Rolling walker (2 wheels)  Recommendations for Other Services       Functional Status Assessment Patient has had a recent decline in their functional status and demonstrates the ability to make significant improvements in function in a reasonable and predictable amount of time.     Precautions / Restrictions Precautions Precautions: Fall Restrictions Weight Bearing Restrictions Per Provider Order: No      Mobility  Bed Mobility Overal bed mobility: Needs Assistance             General bed mobility comments: sitting EOB upon PT arrival to room    Transfers Overall transfer level: Needs assistance Equipment used: Rolling walker (2 wheels) Transfers: Sit to/from Stand Sit to Stand: Supervision            General transfer comment: for safety, stand x2 from EOB    Ambulation/Gait Ambulation/Gait assistance: Contact guard assist Gait Distance (Feet): 150 Feet Assistive device: Rolling walker (2 wheels) Gait Pattern/deviations: Step-through pattern, Decreased stride length, Trunk flexed Gait velocity: decr     General Gait Details: cues for upright posture, proximity to RW. initially unsteady, progressing in steadiness with further distance  Stairs            Wheelchair Mobility     Tilt Bed    Modified Rankin (Stroke Patients Only)       Balance Overall balance assessment: Needs assistance, History of Falls Sitting-balance support: No upper extremity supported, Feet supported Sitting balance-Leahy Scale: Good     Standing balance support: Bilateral upper extremity supported, During functional activity, Reliant on assistive device for balance Standing balance-Leahy Scale: Poor                               Pertinent Vitals/Pain Pain Assessment Pain Assessment: No/denies pain    Home Living Family/patient expects to be discharged to:: Private residence Living Arrangements: Spouse/significant other Available Help at Discharge: Family Type of Home: House Home Access: Stairs to enter   Secretary/administrator of Steps: 2   Home Layout: One level Home Equipment: Cane - single point;Hand held shower head      Prior Function Prior Level of Function : Needs assist;History of Falls (last six months)             Mobility Comments: multiple falls ADLs Comments: pt's wife  managing medications     Extremity/Trunk Assessment   Upper Extremity Assessment Upper Extremity Assessment: Defer to OT evaluation    Lower Extremity Assessment Lower Extremity Assessment: Generalized weakness    Cervical / Trunk Assessment Cervical / Trunk Assessment: Normal  Communication   Communication Communication: No apparent difficulties Factors Affecting  Communication: Hearing impaired    Cognition Arousal: Alert Behavior During Therapy: WFL for tasks assessed/performed   PT - Cognitive impairments: History of cognitive impairments                       PT - Cognition Comments: history of dementia, pleasant and cooperative throughout session Following commands: Intact       Cueing Cueing Techniques: Verbal cues     General Comments General comments (skin integrity, edema, etc.): R bumps and scabbing noted to posterior L calf    Exercises     Assessment/Plan    PT Assessment Patient needs continued PT services  PT Problem List Decreased mobility;Decreased strength;Decreased activity tolerance;Decreased balance;Decreased knowledge of use of DME;Decreased knowledge of precautions       PT Treatment Interventions DME instruction;Therapeutic activities;Gait training;Therapeutic exercise;Patient/family education;Balance training;Stair training;Functional mobility training;Neuromuscular re-education    PT Goals (Current goals can be found in the Care Plan section)  Acute Rehab PT Goals PT Goal Formulation: With patient Time For Goal Achievement: 10/05/23 Potential to Achieve Goals: Good    Frequency Min 2X/week     Co-evaluation               AM-PAC PT "6 Clicks" Mobility  Outcome Measure Help needed turning from your back to your side while in a flat bed without using bedrails?: A Little Help needed moving from lying on your back to sitting on the side of a flat bed without using bedrails?: A Little Help needed moving to and from a bed to a chair (including a wheelchair)?: A Little Help needed standing up from a chair using your arms (e.g., wheelchair or bedside chair)?: A Little Help needed to walk in hospital room?: A Little Help needed climbing 3-5 steps with a railing? : A Little 6 Click Score: 18    End of Session Equipment Utilized During Treatment: Gait belt Activity Tolerance: Patient tolerated  treatment well Patient left: in chair;with call bell/phone within reach;with chair alarm set;with family/visitor present Nurse Communication: Mobility status PT Visit Diagnosis: Other abnormalities of gait and mobility (R26.89);Muscle weakness (generalized) (M62.81)    Time: 1610-9604 PT Time Calculation (min) (ACUTE ONLY): 27 min   Charges:   PT Evaluation $PT Eval Low Complexity: 1 Low PT Treatments $Gait Training: 8-22 mins PT General Charges $$ ACUTE PT VISIT: 1 Visit         Shirlene Doughty, PT DPT Acute Rehabilitation Services Secure Chat Preferred  Office [phone]   Neng Albee Cydney Draft 09/21/2023, 4:28 PM

## 2023-09-21 NOTE — Progress Notes (Signed)
 Daily Progress Note  DOA: 09/18/2023 Hospital Day: 4   Chief Complaint: pancreatitis / elevated LFTs  ASSESSMENT    89 y.o. year old male whose medical history includes but isn't limited to  hypertension, CAD,  OSA on BiPAP, SSS status post pacemaker. Transferred from [facility] 4/13 for MRCP   Acute pancreatitis Elevated LFTs Cholelithiasis Pseudocysts vrs IPMN  No biliary duct dilation on [place],  CT scan or MRCP arguing against choledocholithiasis but possibly passed a gallstone?   MRI / MRCP does show several side branch cystic lesions in the body and tail the pancreas. Favor pseudocysts versus side branch IPMN Today:  Tbili slightly higher than yesterday at 2.8. Liver enzymes continue to downtrend but has developed a mild increase in alk phos  Possible acute cholecystitis. Gallbladder sludge with mild gallbladder wall thickening and trace pericholecystic fluid.    GERD, controlled with Pantoprazole at home   Dementia   See PMH for additional history   PLAN   --General Surgery consulted for cholecystectomy with IOC --Can advance to full liquid diet depending on Surgery's plans   Subjective   No abdominal pain per patient and famil   Objective   GI Studies:   Acute viral hepatitis panel negative Trigl normal Ca+ normal  Recent Labs    09/18/23 1509 09/19/23 0815  WBC 10.9* 12.1*  HGB 15.9 14.6  HCT 48.7 44.5  MCV 92.9 90.8  PLT 148* 190   No results for input(s): "FOLATE", "VITAMINB12", "FERRITIN", "TIBC", "IRONPCTSAT" in the last 72 hours. Recent Labs    09/18/23 1509 09/19/23 0528 09/21/23 0319  NA 136 133* 133*  K 4.0 4.3 3.9  CL 97* 97* 100  CO2 24 24 20*  GLUCOSE 172* 123* 110*  BUN 13 13 13   CREATININE 0.77 0.81 0.94  CALCIUM 9.6 9.0 8.5*   Recent Labs    09/18/23 1509 09/19/23 0528 09/20/23 2001 09/21/23 0319  PROT 7.1 6.7 6.6 6.3*  ALBUMIN 4.2 3.6 3.3* 3.1*  AST 411* 321* 83* 66*  ALT 270* 762* 182* 154*  ALKPHOS 123  125 133* 159*  BILITOT 3.1* 4.7* 2.5* 2.8*  BILIDIR 1.8* 3.0* 1.1*  --   IBILI  --  1.7* 1.4*  --    No results for input(s): "INR" in the last 72 hours. No results for input(s): "AFPTUMOR" in the last 72 hours.   No results for input(s): "ANA" in the last 72 hours.  Imaging:  MR 3D Recon At Scanner CLINICAL DATA:  Acute pancreatitis.  Elevated liver enzymes  EXAM: MRI ABDOMEN WITHOUT AND WITH CONTRAST (INCLUDING MRCP)  TECHNIQUE: Multiplanar multisequence MR imaging of the abdomen was performed both before and after the administration of intravenous contrast. Heavily T2-weighted images of the biliary and pancreatic ducts were obtained, and three-dimensional MRCP images were rendered by post processing.  CONTRAST:  8mL GADAVIST GADOBUTROL 1 MMOL/ML IV SOLN  COMPARISON:  CT 09/18/2023, ultrasound 09/19/2023  FINDINGS: Lower chest:  Lung bases are clear.  Hepatobiliary: There is no intrahepatic biliary duct dilatation. Common hepatic duct and common bile duct normal caliber. No choledocholithiasis.  Small amount sludge and stones collect towards the neck of the gallbladder (image 27/series 9). No gallbladder wall thickening or pericholecystic fluid.  Opposed phase imaging demonstrates marked hepatic steatosis (series 501 and 502))  Pancreas: Pancreatic inflammation better appreciated on CT. No pancreatic duct dilatation. No variant ductal anatomy.  There are several side branch cystic lesions along the body and tail the pancreas.  For example 8 mm cystic lesion on image 24/9. Two small cystic lesions measuring 6 mm on image 24/9. Potential cyst in the tail the pancreas measuring 11 mm.  There is small amount of fluid along the LEFT anterior pararenal fascia related pancreatitis. No organized fluid collections.  Postcontrast T1 images are severely degraded respiratory motion which is typical in patients with active pancreatitis.  Spleen: Normal  spleen.  Adrenals/urinary tract: Adrenal glands and kidneys are normal.  Stomach/Bowel: Stomach and limited of the small bowel is unremarkable  Vascular/Lymphatic: Abdominal aortic normal caliber. No retroperitoneal periportal lymphadenopathy.  Musculoskeletal: No aggressive osseous lesion  IMPRESSION: 1. No choledocholithiasis. No biliary duct dilatation. 2. Small amount of sludge and stones in the gallbladder. No evidence acute cholecystitis. 3. Marked hepatic steatosis. 4. Acute pancreatitis better appreciated on CT. No organized collections. 5. Several side branch cystic lesions in the body and tail the pancreas. Favor pseudocysts versus side branch IPMN. Recommend follow-up MRI in 2 years.  Electronically Signed   By: Deboraha Fallow M.D.   On: 09/20/2023 18:34 MR ABDOMEN MRCP W WO CONTAST CLINICAL DATA:  Acute pancreatitis.  Elevated liver enzymes  EXAM: MRI ABDOMEN WITHOUT AND WITH CONTRAST (INCLUDING MRCP)  TECHNIQUE: Multiplanar multisequence MR imaging of the abdomen was performed both before and after the administration of intravenous contrast. Heavily T2-weighted images of the biliary and pancreatic ducts were obtained, and three-dimensional MRCP images were rendered by post processing.  CONTRAST:  8mL GADAVIST GADOBUTROL 1 MMOL/ML IV SOLN  COMPARISON:  CT 09/18/2023, ultrasound 09/19/2023  FINDINGS: Lower chest:  Lung bases are clear.  Hepatobiliary: There is no intrahepatic biliary duct dilatation. Common hepatic duct and common bile duct normal caliber. No choledocholithiasis.  Small amount sludge and stones collect towards the neck of the gallbladder (image 27/series 9). No gallbladder wall thickening or pericholecystic fluid.  Opposed phase imaging demonstrates marked hepatic steatosis (series 501 and 502))  Pancreas: Pancreatic inflammation better appreciated on CT. No pancreatic duct dilatation. No variant ductal anatomy.  There are  several side branch cystic lesions along the body and tail the pancreas. For example 8 mm cystic lesion on image 24/9. Two small cystic lesions measuring 6 mm on image 24/9. Potential cyst in the tail the pancreas measuring 11 mm.  There is small amount of fluid along the LEFT anterior pararenal fascia related pancreatitis. No organized fluid collections.  Postcontrast T1 images are severely degraded respiratory motion which is typical in patients with active pancreatitis.  Spleen: Normal spleen.  Adrenals/urinary tract: Adrenal glands and kidneys are normal.  Stomach/Bowel: Stomach and limited of the small bowel is unremarkable  Vascular/Lymphatic: Abdominal aortic normal caliber. No retroperitoneal periportal lymphadenopathy.  Musculoskeletal: No aggressive osseous lesion  IMPRESSION: 1. No choledocholithiasis. No biliary duct dilatation. 2. Small amount of sludge and stones in the gallbladder. No evidence acute cholecystitis. 3. Marked hepatic steatosis. 4. Acute pancreatitis better appreciated on CT. No organized collections. 5. Several side branch cystic lesions in the body and tail the pancreas. Favor pseudocysts versus side branch IPMN. Recommend follow-up MRI in 2 years.  Electronically Signed   By: Deboraha Fallow M.D.   On: 09/20/2023 18:34     Scheduled inpatient medications:   cyanocobalamin  1,000 mcg Oral q morning   haloperidol lactate  2 mg Intravenous Once   heparin  5,000 Units Subcutaneous Q8H   losartan  25 mg Oral Daily   memantine  10 mg Oral Daily   multivitamin with minerals  1 tablet  Oral Daily   pantoprazole  40 mg Oral Daily   QUEtiapine  25 mg Oral QHS   rivastigmine  9.5 mg Transdermal Daily   sertraline  25 mg Oral QHS   tamsulosin  0.4 mg Oral Daily   Continuous inpatient infusions:   sodium chloride 75 mL/hr at 09/20/23 2127   piperacillin-tazobactam (ZOSYN)  IV 3.375 g (09/21/23 0512)   PRN inpatient medications: alum & mag  hydroxide-simeth, hydrALAZINE, morphine injection, ondansetron **OR** ondansetron (ZOFRAN) IV, oxyCODONE  Vital signs in last 24 hours: Temp:  [98.3 F (36.8 C)-98.5 F (36.9 C)] 98.4 F (36.9 C) (04/15 0325) Pulse Rate:  [75-83] 83 (04/15 0325) Resp:  [18] 18 (04/14 1807) BP: (131-161)/(68-80) 158/68 (04/15 0325) SpO2:  [95 %-100 %] 95 % (04/15 0325) Last BM Date : 09/19/23  Intake/Output Summary (Last 24 hours) at 09/21/2023 0956 Last data filed at 09/20/2023 1700 Gross per 24 hour  Intake 240 ml  Output 200 ml  Net 40 ml    Intake/Output from previous day: 04/14 0701 - 04/15 0700 In: 360 [P.O.:360] Out: 200 [Urine:200] Intake/Output this shift: No intake/output data recorded.   Physical Exam:  General: Sleepy male in NAD Heart:  Regular rate and rhythm.  Pulmonary: Normal respiratory effort Abdomen: Soft, nondistended, nontender. Normal bowel sounds. Extremities: No lower extremity edema  Neurologic: Alert and oriented Psych:  Cooperative     LOS: 3 days   Mai Schwalbe ,NP 09/21/2023, 9:56 AM

## 2023-09-21 NOTE — Progress Notes (Signed)
 [facility]  PROGRESS NOTE  Joel Nelson ZOX:[id number] DOB: 1934/04/11 DOA: 09/18/2023 PCP: Estanislado Pandy, MD   Brief HPI:   89 y.o. male, with past medical history of hypertension, hyperlipidemia, CAD, dementia, OSA on BiPAP, sick sinus syndrome status post pacemaker. - Was brought to ED by his wife secondary to complaints of abdominal pain, nausea, and distention patient with dementia, poor historian, it was obtained by wife and daughter-in-law at bedside.  Patient with significant pain started yesterday, resolved after some belching, again this morning, he is complaining of more abdominal pain, nausea, as well family report diarrhea earlier this morning.  Prompted them to bring him to the ED. - ED his workup significant for elevated lipase at 3000 range, AST of 400 range, ALT at 270, total bili elevated at 3.1 with direct bili of 1.8, white blood cell count at 10.9, UA is negative, alcohol level is negative, CT abdomen pelvis significant for pancreatitis, hepatic steatosis, with severe prostatomegaly, mild hepatic steatosis, no radiopaque stones or wall thickening of the gallbladder, no intrahepatic or extrahepatic biliary ductal dilation, portal veins are patent.    Assessment/Plan:   Acute pancreatitis Transaminitis -Unclear etiology; lipase was elevated at 2992, has improved to 539 -LFTs are getting worse with ALT 762, total bili 4.7 -Discussed with GI, Dr. Tasia Catchings, at [facility] who recommended  transfer to [facility] for MRCP and possible ERCP -[facility] consulted, plan for MRCP today - Cleared by EP cardiology for MRCP - MRCP did not show CBD stone or ductal dilatation,.  Showed small sludge and stones in the gallbladder as well as acute pancreatitis.  Presumably had gallstone pancreatitis - General Surgery consulted  ?  Acute cholecystitis -Abdominal ultrasound showed gallbladder sludge with mild gallbladder wall thickening and trace pericholecystic fluid.  Negative sonographic sign,  acute focal findings. -Also showed hepatic steatosis - General Surgery consulted for possible cholecystectomy with IOC   CAD status post PTCA of the circumflex in 1995.  LVEF 55 to 60% by echocardiogram in 2017.  - Denies any chest pain. -Will hold aspirin for now pending his further workup to ensure no further workup is anticipated . - Hold Zetia and statin due to transaminitis   Hyperlipidemia  - Hold Zetia and statin due to elevated LFTs.   Sick sinus syndrome/sinus bradycardia - Status post permanent pacemaker - Monitor on telemetry   OSA. - Continue with BiPAP   Hypertension - Continue with Cozaar 25 mg daily, continue with as needed hydralazine   Dementia - Continue with home medications     Medications     cyanocobalamin  1,000 mcg Oral q morning   haloperidol lactate  2 mg Intravenous Once   heparin  5,000 Units Subcutaneous Q8H   losartan  25 mg Oral Daily   memantine  10 mg Oral Daily   multivitamin with minerals  1 tablet Oral Daily   pantoprazole  40 mg Oral Daily   QUEtiapine  25 mg Oral QHS   rivastigmine  9.5 mg Transdermal Daily   sertraline  25 mg Oral QHS   tamsulosin  0.4 mg Oral Daily     Data Reviewed:   CBG:  No results for input(s): "GLUCAP" in the last 168 hours.  SpO2: 99 %    Vitals:   09/20/23 1807 09/20/23 2016 09/21/23 0325 09/21/23 0900  BP: 131/68 (!) 161/80 (!) 158/68 (!) 148/78  Pulse: 75 83 83 70  Resp: 18   17  Temp: 98.5 F (36.9 C) 98.3  F (36.8 C) 98.4 F (36.9 C) 98.1 F (36.7 C)  TempSrc: Oral Oral  Oral  SpO2: 96% 100% 95% 99%  Weight:      Height:          Data Reviewed:  Basic Metabolic Panel: Recent Labs  Lab 09/18/23 1509 09/19/23 0528 09/21/23 0319  NA 136 133* 133*  K 4.0 4.3 3.9  CL 97* 97* 100  CO2 24 24 20*  GLUCOSE 172* 123* 110*  BUN 13 13 13   CREATININE 0.77 0.81 0.94  CALCIUM 9.6 9.0 8.5*    CBC: Recent Labs  Lab 09/18/23 1509 09/19/23 0815  WBC 10.9* 12.1*  HGB 15.9  14.6  HCT 48.7 44.5  MCV 92.9 90.8  PLT 148* 190    LFT Recent Labs  Lab 09/18/23 1509 09/19/23 0528 09/20/23 2001 09/21/23 0319  AST 411* 321* 83* 66*  ALT 270* 762* 182* 154*  ALKPHOS 123 125 133* 159*  BILITOT 3.1* 4.7* 2.5* 2.8*  PROT 7.1 6.7 6.6 6.3*  ALBUMIN 4.2 3.6 3.3* 3.1*     Antibiotics: Anti-infectives (From admission, onward)    Start     Dose/Rate Route Frequency Ordered Stop   09/20/23 0600  piperacillin-tazobactam (ZOSYN) IVPB 3.375 g        3.375 g 12.5 mL/hr over 240 Minutes Intravenous Every 8 hours 09/20/23 0129     09/19/23 2000  piperacillin-tazobactam (ZOSYN) IVPB 3.375 g  Status:  Discontinued        3.375 g 12.5 mL/hr over 240 Minutes Intravenous Every 8 hours 09/19/23 1030 09/20/23 0129   09/19/23 1130  piperacillin-tazobactam (ZOSYN) IVPB 3.375 g        3.375 g 100 mL/hr over 30 Minutes Intravenous  Once 09/19/23 1030 09/19/23 1224        DVT prophylaxis: Heparin  Code Status: Full code  Family Communication: Family at bedside   CONSULTS gastroenterology   Subjective   Denies any complaints.   Objective    Physical Examination:  Appears in no acute distress S1-S2, regular, no murmur auscultated Lungs clear to auscultation bilaterally Abdomen is soft, nontender, no organomegaly   Status is: Inpatient:             Ozell Blunt   [facility] If 7PM-7AM, please contact night-coverage at [email or web address], Office  [phone]   09/21/2023, 2:48 PM  LOS: 3 days

## 2023-09-21 NOTE — Care Management Important Message (Signed)
 Important Message  Patient Details  Name: Joel Nelson MRN: [id number] Date of Birth: 1934-05-17   Important Message Given:  Yes - Medicare IM     Felix Host 09/21/2023, 1:49 PM

## 2023-09-22 ENCOUNTER — Inpatient Hospital Stay (HOSPITAL_COMMUNITY)

## 2023-09-22 DIAGNOSIS — I2510 Atherosclerotic heart disease of native coronary artery without angina pectoris: Secondary | ICD-10-CM

## 2023-09-22 DIAGNOSIS — Z01810 Encounter for preprocedural cardiovascular examination: Secondary | ICD-10-CM | POA: Diagnosis not present

## 2023-09-22 DIAGNOSIS — K851 Biliary acute pancreatitis without necrosis or infection: Secondary | ICD-10-CM

## 2023-09-22 LAB — COMPREHENSIVE METABOLIC PANEL WITH GFR
ALT: 101 U/L — ABNORMAL HIGH (ref 0–44)
AST: 42 U/L — ABNORMAL HIGH (ref 15–41)
Albumin: 2.8 g/dL — ABNORMAL LOW (ref 3.5–5.0)
Alkaline Phosphatase: 152 U/L — ABNORMAL HIGH (ref 38–126)
Anion gap: 10 (ref 5–15)
BUN: 12 mg/dL (ref 8–23)
CO2: 22 mmol/L (ref 22–32)
Calcium: 8.1 mg/dL — ABNORMAL LOW (ref 8.9–10.3)
Chloride: 104 mmol/L (ref 98–111)
Creatinine, Ser: 0.88 mg/dL (ref 0.61–1.24)
GFR, Estimated: >60 mL/min (ref >60)
Glucose, Bld: 88 mg/dL (ref 70–99)
Potassium: 3.6 mmol/L (ref 3.5–5.1)
Sodium: 136 mmol/L (ref 135–145)
Total Bilirubin: 1.8 mg/dL — ABNORMAL HIGH (ref 0.0–1.2)
Total Protein: 5.8 g/dL — ABNORMAL LOW (ref 6.5–8.1)

## 2023-09-22 LAB — CBC
HCT: 39 % (ref 39.0–52.0)
Hemoglobin: 13 g/dL (ref 13.0–17.0)
MCH: 29.7 pg (ref 26.0–34.0)
MCHC: 33.3 g/dL (ref 30.0–36.0)
MCV: 89.2 fL (ref 80.0–100.0)
Platelets: 210 10*3/uL (ref 150–400)
RBC: 4.37 MIL/uL (ref 4.22–5.81)
RDW: 13.4 % (ref 11.5–15.5)
WBC: 7.4 10*3/uL (ref 4.0–10.5)
nRBC: 0 % (ref 0.0–0.2)

## 2023-09-22 LAB — SURGICAL PCR SCREEN
MRSA, PCR: NEGATIVE
Staphylococcus aureus: NEGATIVE

## 2023-09-22 LAB — TROPONIN I (HIGH SENSITIVITY)
Troponin I (High Sensitivity): 10 ng/L (ref <18)
Troponin I (High Sensitivity): 8 ng/L (ref <18)

## 2023-09-22 LAB — LIPASE, BLOOD: Lipase: 33 U/L (ref 11–51)

## 2023-09-22 MED ORDER — PANTOPRAZOLE SODIUM 40 MG IV SOLR
40.0000 mg | Freq: Two times a day (BID) | INTRAVENOUS | Status: DC
  Administered 2023-09-22 – 2023-09-25 (×7): 40 mg via INTRAVENOUS
  Filled 2023-09-22 (×7): qty 10

## 2023-09-22 MED ORDER — NITROGLYCERIN 0.4 MG SL SUBL: 0.4000 mg | SUBLINGUAL_TABLET | SUBLINGUAL | Status: DC | PRN

## 2023-09-22 MED ORDER — NYSTATIN 100000 UNIT/ML MT SUSP
5.0000 mL | Freq: Four times a day (QID) | OROMUCOSAL | Status: DC
  Administered 2023-09-22 – 2023-09-25 (×11): 500000 [IU] via ORAL
  Filled 2023-09-22 (×11): qty 5

## 2023-09-22 NOTE — Plan of Care (Signed)

## 2023-09-22 NOTE — Progress Notes (Signed)
   09/22/23 2100  BiPAP/CPAP/SIPAP  Reason BIPAP/CPAP not in use Non-compliant (Pt does not want to wear BiPAP tonight.)  BiPAP/CPAP /SiPAP Vitals  Resp 19  MEWS Score/Color  MEWS Score 0  MEWS Score Color Green

## 2023-09-22 NOTE — Hospital Course (Addendum)
 PMH of HTN, HLD, CAD, dementia, OSA on BiPAP, sick sinus syndrome SP pacemaker plan. Presented to the hospital with complaints of abdominal pain and nausea found to have acute pancreatitis as well as possible acute cholecystitis. GI was consulted.  Underwent MRCP which did not show any obstruction. General surgery consulted.  Currently plan for lap chole. Cardiology consult for preop clearance. Assessment and Plan: Acute gallstone pancreatitis. Acute cholecystitis. General surgery consulted. GI consulted. MRCP negative for any obstruction. Underwent lap chole.  Required drain placement given concern for difficulty with clip placement.  Monitor for biliary leak.  Currently on IV antibiotic.  Oral thrush. Nystatin  ordered.  Chest tightness. CAD. Echocardiogram ordered. Cardiology consult. Troponins are negative. EKG unremarkable for any acute ischemia. No prohibitive risk so far.  HLD. Statins and Zetia on hold.  Sick sinus syndrome with pacemaker implant. Monitor  OSA. On CPAP.  Continue.  HTN. Blood pressure medication currently on hold.  Dementia.  With delirium. Post op had developed some delirium.  Will continue with Seroquel .  Mentation significantly better.  Back to baseline.  Continue home medication. {Problem list (Optional):1}

## 2023-09-22 NOTE — Progress Notes (Signed)
 [facility] Progress Note Patient: Joel Nelson NWG:[id number] DOB: May 28, 1934 DOA: 09/18/2023  DOS: the patient was seen and examined on 09/22/2023  Brief Hospital Course: PMH of HTN, HLD, CAD, dementia, OSA on BiPAP, sick sinus syndrome SP pacemaker plan. Presented to the hospital with complaints of abdominal pain and nausea found to have acute pancreatitis as well as possible acute cholecystitis. GI was consulted.  Underwent MRCP which did not show any obstruction. General surgery consulted.  Currently plan for lap chole. Cardiology consult for preop clearance. Assessment and Plan: Acute gallstone pancreatitis. Acute cholecystitis. General surgery consulted. GI consulted. MRCP negative for any obstruction. Plan for lap chole. Currently on IV antibiotic.  Oral thrush. Nystatin ordered.  Chest tightness. CAD. Echocardiogram ordered. Cardiology consult. Troponins are negative. EKG unremarkable for any acute ischemia. No prohibitive risk so far.  HLD. Statins and Zetia on hold.  Sick sinus syndrome with pacemaker implant. Monitor  OSA. On CPAP.  Continue.  HTN. Blood pressure medication currently on hold.  Dementia. No agitation but at risk for delirium. Continue home medication.     Subjective: No nausea no vomiting.  Some chest tightness.  No fever no chills.  Physical Exam: General: in Mild distress, No Rash Cardiovascular: S1 and S2 Present, No Murmur Respiratory: Good respiratory effort, Bilateral Air entry present. No Crackles, No wheezes Abdomen: Bowel Sound present, epigastric tenderness Extremities: No edema Neuro: Alert and oriented x3, no new focal deficit  Data Reviewed: I have Reviewed nursing notes, Vitals, and Lab results. Since last encounter, pertinent lab results CBC and BMP   . I have ordered test including CBC and BMP  .  Ordered EKG, troponin, echocardiogram. Discussed with general surgery as well as Cardiology .  Unresulted Labs  (From admission, onward)     Start     Ordered   09/23/23 0500  CBC  Tomorrow morning,   R       Question:  Specimen collection method  Answer:  Lab=Lab collect   09/22/23 1220   09/23/23 0500  Comprehensive metabolic panel  Tomorrow morning,   R       Question:  Specimen collection method  Answer:  Lab=Lab collect   09/22/23 1220   09/22/23 1734  Surgical pcr screen  Once,   R        09/22/23 1733            Disposition: Status is: Inpatient Remains inpatient appropriate because: Needs surgery.  heparin injection 5,000 Units Start: 09/19/23 1400 SCDs Start: 09/18/23 2043   Family Communication: Family at bedside Level of care: Telemetry Medical   Vitals:   09/21/23 1950 09/22/23 0439 09/22/23 0800 09/22/23 1739  BP: (!) 146/65 113/64 125/70 (!) 141/70  Pulse: 72 67 68 72  Resp: 18 16 17 18   Temp: 98.9 F (37.2 C) 98.7 F (37.1 C) 98.5 F (36.9 C) 98.1 F (36.7 C)  TempSrc: Oral Oral Oral   SpO2: 98% 95% 94% 95%  Weight:      Height:         Author: Charlean Congress, MD 09/22/2023 8:04 PM  Please look on [email or web address] to find out who is on call.

## 2023-09-22 NOTE — Consult Note (Addendum)
 Cardiology Consultation   Patient ID: Doniel Maiello MRN: [id number]; DOB: Jun 07, 1934  Admit date: 09/18/2023 Date of Consult: 09/22/2023  PCP:  No primary care provider on file.   [facility] Providers Cardiologist:  Nona Dell, MD  Electrophysiologist:  Hillis Range, MD (Inactive)  Sleep Medicine:  Armanda Magic, MD     Patient Profile:   Hilman Kissling is a 89 y.o. male with a hx of CAD s/p PTCA to LcX in 1995, hypertension, hyperlipidemia, OSA, dementia, sick sinus syndrome s/p Saint Jude PPM in 10/17, acute biliary pancreatitis who is being seen 09/22/2023 for the evaluation of pre-operative risk assessment for a laparoscopic cholecystectomy with IOC at the request of Lynden Oxford MD.  History of Present Illness:   Mr. Acevedo is a 89 year old male with prior cardiac history shown below Coronary artery disease status post percutaneous transluminal coronary angioplasty to the left circumflex in 1995 Had an echo on 01/2016 as part of a workup for syncope.  The LVEF was estimated at 55 to 60% with no regional wall motion abnormalities, grade 1 diastolic dysfunction, a mildly calcified aortic annulus and mild calcified leaflets, calcified mitral valve annulus and mild MR, mild tricuspid regurgitation. Was also placed on a cardiac monitor and ended up receiving a Saint Jude's pacemaker on 03/2016 for his symptomatic sinus bradycardia with syncope.  Was last interrogated on 07/2023 by Dr. Nelly Laurence and was reported to be functioning normally.  Was last seen on 08/2023 by Dr. Diona Browner at that time he was thought to be stable from a cardiac perspective. On 08/23/2023 patient was brought to the ER after having a fall in the parking lot.  Per chart review the patient was walking on the sidewalk and lost his footing which caused him to step off the curb and fall face forward to the ground.  The patient was found to have a closed fracture of the nasal bone.  Was brought to the emergency  department by his wife on 09/18/23 with abdominal pain, nausea, abdominal distention.  The patient has dementia and was a poor historian.  Per the wife and daughter the abdominal pain, nausea, and diarrhea had started earlier that morning.  In the emergency department he had an elevated lipase, AST, LFT, total bilirubin, direct bilirubin.  A CT abdomen and pelvis was done and showed hepatic steatosis, and acute interstitial edematous  pancreatitis.  Abdominal ultrasound was a negative Murphy sign, but showed gallbladder sludge with mild bladder wall thickening and trace pericholecystic fluid.  An MRCP was done and showed no choledocholithiasis, a small amount of sludge and stones in the gallbladder, no biliary duct dilation, no evidence of acute cholecystitis, hepatic steatosis, and acute pancreatitis.  The patient's abdominal pain has resolved. Surgery was consulted and wants to do a laparoscopic cholecystectomy with IOC tomorrow morning.  An echo has been ordered and is pending.  On interview patient who is a poor historian reported having a some chest tightness.  The chest tightness was reportedly better when taking a deep breath. The patient's wife reported that this morning was the first time the patient mentioned having the tightness and that he also had some discomfort in his groin.  After palpating the patient's chest he stated that it was lower in the epigastric region.  Denied having shortness of breath, diaphoresis, chest pain, lower extremity edema, orthopnea, PND.  The patient reportedly lives in [place]. Patient  goes out to eat every day and is able to walk from the parking lot  to the restaurant without any assistance.  Also goes to church on Sundays without any assistance.  Helps fold the laundry, rinse the dishes, and help carry in the groceries.  Until 3 weeks ago he was driving. In the hospital he was able to walk up and down the hall. The patients physical activity is limited by his  instability.  Past Medical History:  Diagnosis Date   Coronary artery disease 1995    remote left circumflex angioplasty without stent placement in 1995   Dementia (HCC)    Excessive daytime sleepiness 11/22/2017   History of kidney stones    History of shingles    Hyperlipidemia    Hypertension    Melanoma (HCC)    Memory loss    OSA treated with BiPAP    moderate obstructive sleep apnea with an AHI of 18.7/h and mild central sleep apnea with his CAI of 5.4/h.On BiPAP at 9/5cm H2O   Pre-diabetes    Presence of permanent cardiac pacemaker 03/24/2016   Sick sinus syndrome (HCC) 03/24/2016   Syncope 03/24/2016    Past Surgical History:  Procedure Laterality Date   EP IMPLANTABLE DEVICE N/A 03/24/2016   SJM Assurity MRI PPM implanted by Dr Johney Frame for sick sinus syndrome   herniated disk repair       Home Medications:  Prior to Admission medications   Medication Sig Start Date End Date Taking? Authorizing Provider  aspirin EC 81 MG tablet Take 162 mg by mouth daily.   Yes [provider]  cyanocobalamin (VITAMIN B12) 1000 MCG tablet Take 1,000 mcg by mouth every morning.   Yes [provider]  ezetimibe (ZETIA) 10 MG tablet Take 10 mg by mouth daily. 12/01/15  Yes [provider]  fluticasone (FLONASE) 50 MCG/ACT nasal spray Place 1 spray into both nostrils daily as needed for allergies.   Yes [provider]  losartan (COZAAR) 25 MG tablet Take 25 mg by mouth daily. 12/22/13  Yes [provider]  memantine (NAMENDA) 10 MG tablet Take 10 mg by mouth daily.   Yes [provider]  Multiple Vitamin (MULTI-VITAMINS) TABS Take 1 tablet by mouth daily.   Yes [provider]  nitroGLYCERIN (NITROSTAT) 0.4 MG SL tablet Place 0.4 mg under the tongue every 5 (five) minutes as needed for chest pain.  08/11/10  Yes [provider]  pantoprazole (PROTONIX) 40 MG tablet Take 40 mg by mouth daily. 09/18/23  Yes [provider]  Polyethyl Glycol-Propyl Glycol (SYSTANE) 0.4-0.3 % SOLN Apply 1 drop to eye daily as needed (for dry eye).   Yes [provider]  rivastigmine (EXELON) 9.5 mg/24hr Place 9.5 mg onto the skin daily.   Yes [provider]  rosuvastatin (CRESTOR) 5 MG tablet Take 5 mg by mouth daily.   Yes [provider]  sertraline (ZOLOFT) 50 MG tablet Take 25 mg by mouth at bedtime.   Yes [provider]  tamsulosin (FLOMAX) 0.4 MG CAPS capsule Take 0.4 mg by mouth daily. 11/21/15  Yes [provider]  acetaminophen (TYLENOL) 500 MG tablet Take 1 tablet (500 mg total) by mouth every 6 (six) hours as needed. 08/23/23   Fayrene Helper, PA-C    Inpatient Medications: Scheduled Meds:  cyanocobalamin  1,000 mcg Oral q morning   heparin  5,000 Units Subcutaneous Q8H   memantine  10 mg Oral Daily   multivitamin with minerals  1 tablet Oral Daily   nystatin  5 mL Oral QID  pantoprazole (PROTONIX) IV  40 mg Intravenous Q12H   QUEtiapine  25 mg Oral QHS   rivastigmine  9.5 mg Transdermal Daily   sertraline  25 mg Oral QHS   tamsulosin  0.4 mg Oral Daily   Continuous Infusions:  sodium chloride 75 mL/hr at 09/22/23 0154   piperacillin-tazobactam (ZOSYN)  IV 3.375 g (09/22/23 0544)   PRN Meds: alum & mag hydroxide-simeth, hydrALAZINE, morphine injection, nitroGLYCERIN, ondansetron **OR** ondansetron (ZOFRAN) IV, oxyCODONE  Allergies:   No Known Allergies  Social History:   Social History   Socioeconomic History   Marital status: Married    Spouse name: Not on file   Number of children: Not on file   Years of education: Not on file   Highest education level: Not on file  Occupational History   Not on file  Tobacco Use   Smoking status: Never   Smokeless tobacco: Never  Vaping Use   Vaping status: Never Used  Substance and Sexual Activity   Alcohol use: No   Drug use: No   Sexual activity: Yes    Partners: Female    Comment: married   Other Topics Concern   Not on file  Social History Narrative   Not on file   Social Drivers of Health   Financial Resource Strain: Not on file  Food Insecurity: No Food Insecurity (09/18/2023)   Hunger Vital Sign    Worried About Running Out of Food in the Last Year: Never true    Ran Out of Food in the Last Year: Never true  Transportation Needs: No Transportation Needs (09/18/2023)   PRAPARE - Administrator, Civil Service (Medical): No    Lack of Transportation (Non-Medical): No  Physical Activity: Not on file  Stress: Not on file  Social Connections: Socially Integrated (09/18/2023)   Social Connection and Isolation Panel [NHANES]    Frequency of Communication with Friends and Family: More than three times a week    Frequency of Social Gatherings with Friends and Family: Three times a week    Attends Religious Services: 1 to 4 times per year    Active Member of Clubs or Organizations: Yes    Attends Banker Meetings: 1 to 4 times per year    Marital Status: Married  Catering manager Violence: Not At Risk (09/18/2023)   Humiliation, Afraid, Rape, and Kick questionnaire    Fear of Current or Ex-Partner: No    Emotionally Abused: No    Physically Abused: No    Sexually Abused: No    Family History:    Family History  Problem Relation Age of Onset   Alzheimer's disease Mother    Lung cancer Sister      ROS:  Please see the history of present illness.   All other ROS reviewed and negative.     Physical Exam/Data:   Vitals:   09/21/23 1647 09/21/23 1950 09/22/23 0439 09/22/23 0800  BP: 135/70 (!) 146/65 113/64 125/70  Pulse: 71 72 67 68  Resp: 18 18 16 17   Temp: 98.4 F (36.9 C) 98.9 F (37.2 C) 98.7 F (37.1 C) 98.5 F (36.9 C)  TempSrc:  Oral Oral Oral  SpO2: 95% 98% 95% 94%  Weight:      Height:        Intake/Output Summary (Last 24 hours) at 09/22/2023 1401 Last data filed at 09/22/2023 0900 Gross per 24 hour  Intake 720 ml   Output --  Net 720 ml  09/18/2023    7:29 PM 09/18/2023    2:30 PM 08/30/2023    1:59 PM  Last 3 Weights  Weight (lbs) 173 lb 15.1 oz 190 lb 193 lb 3.2 oz  Weight (kg) 78.9 kg 86.183 kg 87.635 kg     Body mass index is 26.45 kg/m.  General:  Well nourished, well developed, elderly male in no acute distress.  HEENT: normal Neck: no JVD Vascular: No carotid bruits; Distal pulses 2+ bilaterally Cardiac:  normal S1, S2; RRR; no murmur  Lungs:  clear to auscultation bilaterally, no wheezing, rhonchi or rales  Ext: no edema Musculoskeletal:  No deformities Skin: warm and dry  Neuro:   no focal abnormalities noted Psych:  Normal affect   EKG:  The EKG was personally reviewed and demonstrates:  Normal sinus rhythm with a rate of 69 and 1 PAC. Telemetry:  Telemetry was personally reviewed and demonstrates: Normal sinus rhythm 70s.  A few infrequent pacer spikes were seen  Relevant CV Studies: Echo pending  Laboratory Data:  High Sensitivity Troponin:  No results for input(s): "TROPONINIHS" in the last 720 hours.   Chemistry Recent Labs  Lab 09/21/23 0319 09/21/23 1819 09/22/23 0759  NA 133* 134* 136  K 3.9 3.7 3.6  CL 100 99 104  CO2 20* 25 22  GLUCOSE 110* 85 88  BUN 13 12 12   CREATININE 0.94 0.86 0.88  CALCIUM 8.5* 8.3* 8.1*  GFRNONAA >60 >60 >60  ANIONGAP 13 10 10     Recent Labs  Lab 09/21/23 0319 09/21/23 1819 09/22/23 0759  PROT 6.3* 6.1* 5.8*  ALBUMIN 3.1* 3.0* 2.8*  AST 66* 50* 42*  ALT 154* 127* 101*  ALKPHOS 159* 166* 152*  BILITOT 2.8* 2.0* 1.8*   Lipids  Recent Labs  Lab 09/18/23 2156  CHOL 104  TRIG 69  HDL 48  LDLCALC 42  CHOLHDL 2.2    Hematology Recent Labs  Lab 09/18/23 1509 09/19/23 0815 09/22/23 0759  WBC 10.9* 12.1* 7.4  RBC 5.24 4.90 4.37  HGB 15.9 14.6 13.0  HCT 48.7 44.5 39.0  MCV 92.9 90.8 89.2  MCH 30.3 29.8 29.7  MCHC 32.6 32.8 33.3  RDW 13.0 13.2 13.4  PLT 148* 190 210   Thyroid No results for input(s):  "TSH", "FREET4" in the last 168 hours.  BNPNo results for input(s): "BNP", "PROBNP" in the last 168 hours.  DDimer No results for input(s): "DDIMER" in the last 168 hours.   Radiology/Studies:  MR ABDOMEN MRCP W WO CONTAST Result Date: 09/20/2023 CLINICAL DATA:  Acute pancreatitis.  Elevated liver enzymes EXAM: MRI ABDOMEN WITHOUT AND WITH CONTRAST (INCLUDING MRCP) TECHNIQUE: Multiplanar multisequence MR imaging of the abdomen was performed both before and after the administration of intravenous contrast. Heavily T2-weighted images of the biliary and pancreatic ducts were obtained, and three-dimensional MRCP images were rendered by post processing. CONTRAST:  8mL GADAVIST GADOBUTROL 1 MMOL/ML IV SOLN COMPARISON:  CT 09/18/2023, ultrasound 09/19/2023 FINDINGS: Lower chest:  Lung bases are clear. Hepatobiliary: There is no intrahepatic biliary duct dilatation. Common hepatic duct and common bile duct normal caliber. No choledocholithiasis. Small amount sludge and stones collect towards the neck of the gallbladder (image 27/series 9). No gallbladder wall thickening or pericholecystic fluid. Opposed phase imaging demonstrates marked hepatic steatosis (series 501 and 502)) Pancreas: Pancreatic inflammation better appreciated on CT. No pancreatic duct dilatation. No variant ductal anatomy. There are several side branch cystic lesions along the body and tail the pancreas. For example 8 mm cystic  lesion on image 24/9. Two small cystic lesions measuring 6 mm on image 24/9. Potential cyst in the tail the pancreas measuring 11 mm. There is small amount of fluid along the LEFT anterior pararenal fascia related pancreatitis. No organized fluid collections. Postcontrast T1 images are severely degraded respiratory motion which is typical in patients with active pancreatitis. Spleen: Normal spleen. Adrenals/urinary tract: Adrenal glands and kidneys are normal. Stomach/Bowel: Stomach and limited of the small bowel is  unremarkable Vascular/Lymphatic: Abdominal aortic normal caliber. No retroperitoneal periportal lymphadenopathy. Musculoskeletal: No aggressive osseous lesion IMPRESSION: 1. No choledocholithiasis. No biliary duct dilatation. 2. Small amount of sludge and stones in the gallbladder. No evidence acute cholecystitis. 3. Marked hepatic steatosis. 4. Acute pancreatitis better appreciated on CT. No organized collections. 5. Several side branch cystic lesions in the body and tail the pancreas. Favor pseudocysts versus side branch IPMN. Recommend follow-up MRI in 2 years. Electronically Signed   By: Genevive Bi M.D.   On: 09/20/2023 18:34   MR 3D Recon At Scanner Result Date: 09/20/2023 CLINICAL DATA:  Acute pancreatitis.  Elevated liver enzymes EXAM: MRI ABDOMEN WITHOUT AND WITH CONTRAST (INCLUDING MRCP) TECHNIQUE: Multiplanar multisequence MR imaging of the abdomen was performed both before and after the administration of intravenous contrast. Heavily T2-weighted images of the biliary and pancreatic ducts were obtained, and three-dimensional MRCP images were rendered by post processing. CONTRAST:  8mL GADAVIST GADOBUTROL 1 MMOL/ML IV SOLN COMPARISON:  CT 09/18/2023, ultrasound 09/19/2023 FINDINGS: Lower chest:  Lung bases are clear. Hepatobiliary: There is no intrahepatic biliary duct dilatation. Common hepatic duct and common bile duct normal caliber. No choledocholithiasis. Small amount sludge and stones collect towards the neck of the gallbladder (image 27/series 9). No gallbladder wall thickening or pericholecystic fluid. Opposed phase imaging demonstrates marked hepatic steatosis (series 501 and 502)) Pancreas: Pancreatic inflammation better appreciated on CT. No pancreatic duct dilatation. No variant ductal anatomy. There are several side branch cystic lesions along the body and tail the pancreas. For example 8 mm cystic lesion on image 24/9. Two small cystic lesions measuring 6 mm on image 24/9. Potential  cyst in the tail the pancreas measuring 11 mm. There is small amount of fluid along the LEFT anterior pararenal fascia related pancreatitis. No organized fluid collections. Postcontrast T1 images are severely degraded respiratory motion which is typical in patients with active pancreatitis. Spleen: Normal spleen. Adrenals/urinary tract: Adrenal glands and kidneys are normal. Stomach/Bowel: Stomach and limited of the small bowel is unremarkable Vascular/Lymphatic: Abdominal aortic normal caliber. No retroperitoneal periportal lymphadenopathy. Musculoskeletal: No aggressive osseous lesion IMPRESSION: 1. No choledocholithiasis. No biliary duct dilatation. 2. Small amount of sludge and stones in the gallbladder. No evidence acute cholecystitis. 3. Marked hepatic steatosis. 4. Acute pancreatitis better appreciated on CT. No organized collections. 5. Several side branch cystic lesions in the body and tail the pancreas. Favor pseudocysts versus side branch IPMN. Recommend follow-up MRI in 2 years. Electronically Signed   By: Genevive Bi M.D.   On: 09/20/2023 18:34   US Abdomen Limited RUQ (LIVER/GB) Result Date: 09/19/2023 CLINICAL DATA:  [id number] Pancreatitis [id number] EXAM: ULTRASOUND ABDOMEN LIMITED RIGHT UPPER QUADRANT COMPARISON:  CT abdomen pelvis 09/18/2023 FINDINGS: Gallbladder: Echogenic layering material within the gallbladder lumen. No gallstones. Mild wall thickening visualized and trace pericholecystic fluid. No sonographic Murphy sign noted by sonographer. Common bile duct: Diameter: 4 mm. Liver: No focal lesion identified. Increased parenchymal echogenicity. Portal vein is patent on color Doppler imaging with normal direction of blood flow towards the liver.  Other: None. IMPRESSION: 1. Gallbladder sludge with mild gallbladder wall thickening and trace pericholecystic fluid. Negative sonographic versus sign. Equivocal findings. Correlate clinically for acute cholecystitis. 2. Hepatic steatosis. Please note  limited evaluation for focal hepatic masses in a patient with hepatic steatosis due to decreased penetration of the acoustic ultrasound waves. Electronically Signed   By: Morgane  Naveau M.D.   On: 09/19/2023 11:11   CT ABDOMEN PELVIS W CONTRAST Result Date: 09/18/2023 CLINICAL DATA:  Abdominal pain, acute, nonlocalized EXAM: CT ABDOMEN AND PELVIS WITH CONTRAST TECHNIQUE: Multidetector CT imaging of the abdomen and pelvis was performed using the standard protocol following bolus administration of intravenous contrast. RADIATION DOSE REDUCTION: This exam was performed according to the departmental dose-optimization program which includes automated exposure control, adjustment of the mA and/or kV according to patient size and/or use of iterative reconstruction technique. CONTRAST:  100mL OMNIPAQUE IOHEXOL 300 MG/ML  SOLN COMPARISON:  None Available. FINDINGS: Lower chest: No focal airspace consolidation or pleural effusion.Right atrium and right ventricle pacemaker wires partially visualized. Dense multi-vessel coronary atherosclerosis. Hepatobiliary: No mass. Mild hepatic steatosis.No radiopaque stones or wall thickening of the gallbladder.No intrahepatic or extrahepatic biliary ductal dilation.The portal veins are patent. Pancreas: No mass. Regions of parenchymal atrophy with subtle ductal dilation present. Parenchymal calcifications also noted. These findings are suggestive of chronic pancreatitis. There is moderate inflammatory stranding surrounding the pancreatic body and tail with acute peripancreatic fluid extending into the anterior left pararenal space in the left paracolic gutter. The inflammatory stranding extends around the pancreatic head, uncinate process, and the second portion of the duodenum.No well-formed or drainable peripancreatic fluid collection. Spleen: Normal size. No mass. Adrenals/Urinary Tract: No adrenal masses. multiple bilateral hypodensities are noted in both kidneys, some of which  are too small to definitively characterize, but likely small cysts. no nephrolithiasis or hydronephrosis. The urinary bladder is distended without focal abnormality. Stomach/Bowel: The stomach is decompressed without focal abnormality. No small bowel wall thickening or inflammation. No small bowel obstruction. Normal appendix. Descending and sigmoid colonic diverticulosis. No changes of acute diverticulitis. Vascular/Lymphatic: No aortic aneurysm. Descending and sigmoid colonic diverticulosis. No changes of acute diverticulitis. No intraabdominal or pelvic lymphadenopathy. Reproductive: Severe prostatomegaly.No free pelvic fluid. Other: No pneumoperitoneum or ascites. Musculoskeletal: No acute fracture or destructive lesion.Multilevel degenerative disc disease of the spine. Mild, grade 1 anterolisthesis of L4 on L5 due to bilateral pars interarticularis defects at L4. IMPRESSION: 1. Findings consistent with acute interstitial edematous pancreatitis. Correlation with serum lipase recommended. Underlying findings of chronic pancreatitis also noted. No well-formed or drainable peripancreatic fluid collection. 2. Descending and sigmoid colonic diverticulosis. No changes of acute diverticulitis. 3. Hepatic steatosis. 4. Severe prostatomegaly. Electronically Signed   By: Rance Burrows M.D.   On: 09/18/2023 17:19     Assessment and Plan:   Dereck Agerton is a 89 y.o. male with a hx of CAD s/p PTCA to LcX in 1995, hypertension, hyperlipidemia, OSA, dementia, sick sinus syndrome s/p Saint Jude PPM, acute biliary pancreatitis   who is being seen 09/22/2023 for the evaluation of pre-operative risk assessment for a laparoscopic cholecystectomy with IOC at the request of Charlean Congress MD.  Acute biliary pancreatitis Preop risk assessment Surgery was consulted and is planning to do a laparoscopic cholecystectomy with IOC tomorrow morning. Has an RCRI score of 1 this means there is a 6% perioperative risk for a major  cardiac event depending on calculation used. His advanced age is also a risk factor for complication. Patient is able to do  more than 4 metabolic equivalents on the Duke activity index. On interview patient who is a poor historian reported having some chest tightness.  The chest tightness was reportedly better when taking a deep breath. The patient's wife reported that this morning was the first time the patient mentioned having the tightness and that he also had some discomfort in his groin and abdomen.  After palpating the patient's chest he stated that it was lower in the epigastric region. Suspect that this is more abdominal pain and the patient was confused and pointing to the incorrect location. He has not had any recent convincing anginal-type symptoms. EKG reassuring.  Denied having shortness of breath, diaphoresis, chest pain, lower extremity edema, orthopnea, PND.  -- Echo Pending - cannot be done today (spoke with echo tech) but they will do first thing in the morning -- Discussed cardiac risks with patient, wife, and son and they agree that the risks are worth the benefit.  -- Assuming that there are no concerning findings on the echo the patient seems to have an acceptable cardiac risk to proceed with the laparoscopic cholecystectomy. However, cardiology attending will follow to help guide final recs  CAD s/p remote PTCA 1995 ASA held in anticipation of need for surgery Statin held due to elevated LFTs, can revisit as outpatient  SSS s/p STJ PPM Continue EP f/u as outpatient  Other conditions managed per primary   Risk Assessment/Risk Scores:        For questions or updates, please contact [facility] Please consult [email or web address] for contact info under    Signed, Melita Springer, PA-C  09/22/2023 2:01 PM   History and all data above reviewed.  Patient examined.  I agree with the findings as above.  The patient presents with abdominal distension, nausea, constipation and  RUQ abdominal pain.  The patient denies any new symptoms such as chest discomfort, neck or arm discomfort. There has been no new shortness of breath, PND or orthopnea. There have been no reported palpitations, presyncope or syncope.  He has dementia but his wife reports they walk and go out to dinner every day and he does not complain of these things.   The patient exam reveals COR:RRR  ,  Lungs: Clear  ,  Abd:   Positive abdominal distension with decreased bowel sounds and RUQ tenderness without rebound, Ext Mild diffuse edema  .  All available labs, radiology testing, previous records reviewed. Agree with documented assessment and plan. Preop:  The patient has between 6 and 0.9% risk of MACE with this surgery.  This is not prohibitive and would not require a less invasive approach if surgery is the better option.  The risk is based on chronic disease and not active symptoms.  He has a reasonable functional level.  No further cardiovascular testing is essential prior to the surgery.  We will try to get an echo for baseline prior to OR.  We will follow as needed.   Royston Cornea Ketina Mars  7:05 PM  09/22/2023

## 2023-09-22 NOTE — Progress Notes (Signed)
 Subjective: CC: Pts son and wife at bedside.  No abdominal pain, n/v. Tolerating po yesterday. BM yesterday. No prn pain medication yesterday.  Has not been having any chest pain or sob.   Afebrile. No tachycardia or hypotension. WBC wnl. LFT's downtrending. Lipase wnl.   Objective: Vital signs in last 24 hours: Temp:  [98.4 F (36.9 C)-98.9 F (37.2 C)] 98.5 F (36.9 C) (04/16 0800) Pulse Rate:  [67-72] 68 (04/16 0800) Resp:  [16-18] 17 (04/16 0800) BP: (113-146)/(64-70) 125/70 (04/16 0800) SpO2:  [94 %-98 %] 94 % (04/16 0800) Last BM Date : 09/21/23  Intake/Output from previous day: 04/15 0701 - 04/16 0700 In: 720 [P.O.:720] Out: -  Intake/Output this shift: Total I/O In: 240 [P.O.:240] Out: -   PE: Gen:  Alert, NAD, pleasant Abd: Soft, ND, still with mild RUQ and epigastric ttp, no rigidity or guarding, +BS  Lab Results:  Recent Labs    09/22/23 0759  WBC 7.4  HGB 13.0  HCT 39.0  PLT 210   BMET Recent Labs    09/21/23 1819 09/22/23 0759  NA 134* 136  K 3.7 3.6  CL 99 104  CO2 25 22  GLUCOSE 85 88  BUN 12 12  CREATININE 0.86 0.88  CALCIUM 8.3* 8.1*   PT/INR No results for input(s): "LABPROT", "INR" in the last 72 hours. CMP     Component Value Date/Time   NA 136 09/22/2023 0759   K 3.6 09/22/2023 0759   CL 104 09/22/2023 0759   CO2 22 09/22/2023 0759   GLUCOSE 88 09/22/2023 0759   BUN 12 09/22/2023 0759   CREATININE 0.88 09/22/2023 0759   CALCIUM 8.1 (L) 09/22/2023 0759   PROT 5.8 (L) 09/22/2023 0759   ALBUMIN 2.8 (L) 09/22/2023 0759   AST 42 (H) 09/22/2023 0759   ALT 101 (H) 09/22/2023 0759   ALKPHOS 152 (H) 09/22/2023 0759   BILITOT 1.8 (H) 09/22/2023 0759   GFRNONAA >60 09/22/2023 0759   GFRAA >60 03/24/2016 1000   Lipase     Component Value Date/Time   LIPASE 33 09/22/2023 0759    Studies/Results: MR ABDOMEN MRCP W WO CONTAST Result Date: 09/20/2023 CLINICAL DATA:  Acute pancreatitis.  Elevated liver enzymes EXAM:  MRI ABDOMEN WITHOUT AND WITH CONTRAST (INCLUDING MRCP) TECHNIQUE: Multiplanar multisequence MR imaging of the abdomen was performed both before and after the administration of intravenous contrast. Heavily T2-weighted images of the biliary and pancreatic ducts were obtained, and three-dimensional MRCP images were rendered by post processing. CONTRAST:  8mL GADAVIST GADOBUTROL 1 MMOL/ML IV SOLN COMPARISON:  CT 09/18/2023, ultrasound 09/19/2023 FINDINGS: Lower chest:  Lung bases are clear. Hepatobiliary: There is no intrahepatic biliary duct dilatation. Common hepatic duct and common bile duct normal caliber. No choledocholithiasis. Small amount sludge and stones collect towards the neck of the gallbladder (image 27/series 9). No gallbladder wall thickening or pericholecystic fluid. Opposed phase imaging demonstrates marked hepatic steatosis (series 501 and 502)) Pancreas: Pancreatic inflammation better appreciated on CT. No pancreatic duct dilatation. No variant ductal anatomy. There are several side branch cystic lesions along the body and tail the pancreas. For example 8 mm cystic lesion on image 24/9. Two small cystic lesions measuring 6 mm on image 24/9. Potential cyst in the tail the pancreas measuring 11 mm. There is small amount of fluid along the LEFT anterior pararenal fascia related pancreatitis. No organized fluid collections. Postcontrast T1 images are severely degraded respiratory motion which is typical in patients with  active pancreatitis. Spleen: Normal spleen. Adrenals/urinary tract: Adrenal glands and kidneys are normal. Stomach/Bowel: Stomach and limited of the small bowel is unremarkable Vascular/Lymphatic: Abdominal aortic normal caliber. No retroperitoneal periportal lymphadenopathy. Musculoskeletal: No aggressive osseous lesion IMPRESSION: 1. No choledocholithiasis. No biliary duct dilatation. 2. Small amount of sludge and stones in the gallbladder. No evidence acute cholecystitis. 3. Marked  hepatic steatosis. 4. Acute pancreatitis better appreciated on CT. No organized collections. 5. Several side branch cystic lesions in the body and tail the pancreas. Favor pseudocysts versus side branch IPMN. Recommend follow-up MRI in 2 years. Electronically Signed   By: Deboraha Fallow M.D.   On: 09/20/2023 18:34   MR 3D Recon At Scanner Result Date: 09/20/2023 CLINICAL DATA:  Acute pancreatitis.  Elevated liver enzymes EXAM: MRI ABDOMEN WITHOUT AND WITH CONTRAST (INCLUDING MRCP) TECHNIQUE: Multiplanar multisequence MR imaging of the abdomen was performed both before and after the administration of intravenous contrast. Heavily T2-weighted images of the biliary and pancreatic ducts were obtained, and three-dimensional MRCP images were rendered by post processing. CONTRAST:  8mL GADAVIST GADOBUTROL 1 MMOL/ML IV SOLN COMPARISON:  CT 09/18/2023, ultrasound 09/19/2023 FINDINGS: Lower chest:  Lung bases are clear. Hepatobiliary: There is no intrahepatic biliary duct dilatation. Common hepatic duct and common bile duct normal caliber. No choledocholithiasis. Small amount sludge and stones collect towards the neck of the gallbladder (image 27/series 9). No gallbladder wall thickening or pericholecystic fluid. Opposed phase imaging demonstrates marked hepatic steatosis (series 501 and 502)) Pancreas: Pancreatic inflammation better appreciated on CT. No pancreatic duct dilatation. No variant ductal anatomy. There are several side branch cystic lesions along the body and tail the pancreas. For example 8 mm cystic lesion on image 24/9. Two small cystic lesions measuring 6 mm on image 24/9. Potential cyst in the tail the pancreas measuring 11 mm. There is small amount of fluid along the LEFT anterior pararenal fascia related pancreatitis. No organized fluid collections. Postcontrast T1 images are severely degraded respiratory motion which is typical in patients with active pancreatitis. Spleen: Normal spleen.  Adrenals/urinary tract: Adrenal glands and kidneys are normal. Stomach/Bowel: Stomach and limited of the small bowel is unremarkable Vascular/Lymphatic: Abdominal aortic normal caliber. No retroperitoneal periportal lymphadenopathy. Musculoskeletal: No aggressive osseous lesion IMPRESSION: 1. No choledocholithiasis. No biliary duct dilatation. 2. Small amount of sludge and stones in the gallbladder. No evidence acute cholecystitis. 3. Marked hepatic steatosis. 4. Acute pancreatitis better appreciated on CT. No organized collections. 5. Several side branch cystic lesions in the body and tail the pancreas. Favor pseudocysts versus side branch IPMN. Recommend follow-up MRI in 2 years. Electronically Signed   By: Deboraha Fallow M.D.   On: 09/20/2023 18:34    Anti-infectives: Anti-infectives (From admission, onward)    Start     Dose/Rate Route Frequency Ordered Stop   09/20/23 0600  piperacillin-tazobactam (ZOSYN) IVPB 3.375 g        3.375 g 12.5 mL/hr over 240 Minutes Intravenous Every 8 hours 09/20/23 0129     09/19/23 2000  piperacillin-tazobactam (ZOSYN) IVPB 3.375 g  Status:  Discontinued        3.375 g 12.5 mL/hr over 240 Minutes Intravenous Every 8 hours 09/19/23 1030 09/20/23 0129   09/19/23 1130  piperacillin-tazobactam (ZOSYN) IVPB 3.375 g        3.375 g 100 mL/hr over 30 Minutes Intravenous  Once 09/19/23 1030 09/19/23 1224        Assessment/Plan Acute biliary pancreatitis - CT 4/12 with acute interstitial edematous pancreatitis  - RUQ  US  4/13 with gallbladder sludge and mild gallbladder wall thickening and trace pericholecystic fluid - MRCP 4/14 did not show choledocholithiasis but did show cholelithiasis without evidence of acute cholecystitis, hepatic steatosis, several side branch cystic lesions in the body and tail of the pancreas favoring pseudocysts v. Side branch IPMN - Lipase normalized.  - LFT's remain elevated but dowtrending. Cont to trend. Pain resolved, improving  abdominal exam w/ minimal epigastric and ruq ttp on today's exam. Question if he passed a gallstone.  - Discussed w/ attending. Will ask cardiology to see today for peri-operative risk evaluation. Pending their evaluation and continued improvement, will tentatively plan OR in the AM for lap chole w/ IOC. The pt wife (pt w/ hx of dementia) is agreeable with this plan.    FEN: FLD, NPO at midnight.  VTE: okay for chem ppx from a general surgery standpoint ID: [email or web address] per primary    - per [facility] -  CAD HTN HLD SSS s/p PPM OSA on BiPAP Dementia  GERD Pre-diabetes Hx of melanoma  Hx of shingles  I reviewed nursing notes, Consultant (GI) notes, hospitalist notes, last 24 h vitals and pain scores, last 48 h intake and output, last 24 h labs and trends, and last 24 h imaging results.   LOS: 4 days    Delton Filbert, [facility] 09/22/2023, 11:53 AM Please see Amion for pager number during day hours 7:00am-4:30pm

## 2023-09-23 ENCOUNTER — Encounter (HOSPITAL_COMMUNITY): Payer: Self-pay | Admitting: Internal Medicine

## 2023-09-23 ENCOUNTER — Other Ambulatory Visit: Payer: Self-pay

## 2023-09-23 ENCOUNTER — Encounter (HOSPITAL_COMMUNITY)
Admission: EM | Disposition: A | Discharge status: Home / Self Care | Payer: Self-pay | Source: Home / Self Care | Attending: Internal Medicine

## 2023-09-23 ENCOUNTER — Inpatient Hospital Stay (HOSPITAL_COMMUNITY)

## 2023-09-23 DIAGNOSIS — G4733 Obstructive sleep apnea (adult) (pediatric): Secondary | ICD-10-CM | POA: Diagnosis not present

## 2023-09-23 DIAGNOSIS — K851 Biliary acute pancreatitis without necrosis or infection: Secondary | ICD-10-CM | POA: Diagnosis not present

## 2023-09-23 DIAGNOSIS — R079 Chest pain, unspecified: Secondary | ICD-10-CM

## 2023-09-23 DIAGNOSIS — I10 Essential (primary) hypertension: Secondary | ICD-10-CM | POA: Diagnosis not present

## 2023-09-23 DIAGNOSIS — K859 Acute pancreatitis without necrosis or infection, unspecified: Secondary | ICD-10-CM | POA: Diagnosis not present

## 2023-09-23 DIAGNOSIS — I2510 Atherosclerotic heart disease of native coronary artery without angina pectoris: Secondary | ICD-10-CM | POA: Diagnosis not present

## 2023-09-23 HISTORY — PX: CHOLECYSTECTOMY: SHX55

## 2023-09-23 LAB — COMPREHENSIVE METABOLIC PANEL WITH GFR
ALT: 87 U/L — ABNORMAL HIGH (ref 0–44)
AST: 39 U/L (ref 15–41)
Albumin: 2.7 g/dL — ABNORMAL LOW (ref 3.5–5.0)
Alkaline Phosphatase: 159 U/L — ABNORMAL HIGH (ref 38–126)
Anion gap: 14 (ref 5–15)
BUN: 12 mg/dL (ref 8–23)
CO2: 21 mmol/L — ABNORMAL LOW (ref 22–32)
Calcium: 8.4 mg/dL — ABNORMAL LOW (ref 8.9–10.3)
Chloride: 102 mmol/L (ref 98–111)
Creatinine, Ser: 0.88 mg/dL (ref 0.61–1.24)
GFR, Estimated: >60 mL/min (ref >60)
Glucose, Bld: 89 mg/dL (ref 70–99)
Potassium: 3.7 mmol/L (ref 3.5–5.1)
Sodium: 137 mmol/L (ref 135–145)
Total Bilirubin: 1.5 mg/dL — ABNORMAL HIGH (ref 0.0–1.2)
Total Protein: 5.8 g/dL — ABNORMAL LOW (ref 6.5–8.1)

## 2023-09-23 LAB — CBC
HCT: 38.9 % — ABNORMAL LOW (ref 39.0–52.0)
Hemoglobin: 13 g/dL (ref 13.0–17.0)
MCH: 30.1 pg (ref 26.0–34.0)
MCHC: 33.4 g/dL (ref 30.0–36.0)
MCV: 90 fL (ref 80.0–100.0)
Platelets: 209 10*3/uL (ref 150–400)
RBC: 4.32 MIL/uL (ref 4.22–5.81)
RDW: 13.5 % (ref 11.5–15.5)
WBC: 8 10*3/uL (ref 4.0–10.5)
nRBC: 0 % (ref 0.0–0.2)

## 2023-09-23 LAB — ECHOCARDIOGRAM COMPLETE
Area-P 1/2: 3.15 cm2
Height: 68 in
S' Lateral: 3.5 cm
Weight: 2783.09 [oz_av]

## 2023-09-23 SURGERY — LAPAROSCOPIC CHOLECYSTECTOMY WITH INTRAOPERATIVE CHOLANGIOGRAM
Anesthesia: General

## 2023-09-23 MED ORDER — FENTANYL CITRATE (PF) 250 MCG/5ML IJ SOLN
INTRAMUSCULAR | Status: AC
  Filled 2023-09-23: qty 5

## 2023-09-23 MED ORDER — FENTANYL CITRATE (PF) 100 MCG/2ML IJ SOLN
INTRAMUSCULAR | Status: AC
  Filled 2023-09-23: qty 2

## 2023-09-23 MED ORDER — MELATONIN 5 MG PO TABS
5.0000 mg | ORAL_TABLET | Freq: Every day | ORAL | Status: DC
Start: 2023-09-23 — End: 2023-09-25
  Administered 2023-09-23 – 2023-09-24 (×2): 5 mg via ORAL
  Filled 2023-09-23 (×2): qty 1

## 2023-09-23 MED ORDER — DEXAMETHASONE SODIUM PHOSPHATE 10 MG/ML IJ SOLN
INTRAMUSCULAR | Status: DC | PRN
  Administered 2023-09-23: 10 mg via INTRAVENOUS

## 2023-09-23 MED ORDER — ONDANSETRON HCL 4 MG/2ML IJ SOLN
INTRAMUSCULAR | Status: AC
  Filled 2023-09-23: qty 2

## 2023-09-23 MED ORDER — PHENYLEPHRINE 80 MCG/ML (10ML) SYRINGE FOR IV PUSH (FOR BLOOD PRESSURE SUPPORT)
PREFILLED_SYRINGE | INTRAVENOUS | Status: DC | PRN
  Administered 2023-09-23: 40 ug via INTRAVENOUS
  Administered 2023-09-23: 80 ug via INTRAVENOUS

## 2023-09-23 MED ORDER — CHLORHEXIDINE GLUCONATE 0.12 % MT SOLN
OROMUCOSAL | Status: AC
  Administered 2023-09-23: 15 mL via OROMUCOSAL
  Filled 2023-09-23: qty 15

## 2023-09-23 MED ORDER — DEXAMETHASONE SODIUM PHOSPHATE 10 MG/ML IJ SOLN
INTRAMUSCULAR | Status: AC
  Filled 2023-09-23: qty 1

## 2023-09-23 MED ORDER — CELECOXIB 200 MG PO CAPS
200.0000 mg | ORAL_CAPSULE | Freq: Once | ORAL | Status: AC
  Administered 2023-09-23: 200 mg via ORAL
  Filled 2023-09-23: qty 1

## 2023-09-23 MED ORDER — ALBUMIN HUMAN 5 % IV SOLN: INTRAVENOUS | Status: DC | PRN

## 2023-09-23 MED ORDER — CHLORHEXIDINE GLUCONATE 0.12 % MT SOLN: 15.0000 mL | Freq: Once | OROMUCOSAL | Status: AC

## 2023-09-23 MED ORDER — 0.9 % SODIUM CHLORIDE (POUR BTL) OPTIME
TOPICAL | Status: DC | PRN
  Administered 2023-09-23: 1000 mL

## 2023-09-23 MED ORDER — SODIUM CHLORIDE 0.9 % IR SOLN
Status: DC | PRN
  Administered 2023-09-23: 1 via INTRAVESICAL

## 2023-09-23 MED ORDER — FENTANYL CITRATE (PF) 250 MCG/5ML IJ SOLN
INTRAMUSCULAR | Status: DC | PRN
  Administered 2023-09-23: 25 ug via INTRAVENOUS
  Administered 2023-09-23: 50 ug via INTRAVENOUS
  Administered 2023-09-23: 25 ug via INTRAVENOUS
  Administered 2023-09-23: 50 ug via INTRAVENOUS

## 2023-09-23 MED ORDER — SUGAMMADEX SODIUM 200 MG/2ML IV SOLN
INTRAVENOUS | Status: DC | PRN
  Administered 2023-09-23: 200 mg via INTRAVENOUS

## 2023-09-23 MED ORDER — ROCURONIUM BROMIDE 10 MG/ML (PF) SYRINGE
PREFILLED_SYRINGE | INTRAVENOUS | Status: DC | PRN
  Administered 2023-09-23: 50 mg via INTRAVENOUS

## 2023-09-23 MED ORDER — LORAZEPAM 2 MG/ML IJ SOLN
0.5000 mg | INTRAMUSCULAR | Status: DC | PRN
  Administered 2023-09-24 – 2023-09-25 (×2): 0.5 mg via INTRAVENOUS
  Filled 2023-09-23 (×2): qty 1

## 2023-09-23 MED ORDER — EPHEDRINE 5 MG/ML INJ
INTRAVENOUS | Status: AC
  Filled 2023-09-23: qty 5

## 2023-09-23 MED ORDER — MORPHINE SULFATE (PF) 2 MG/ML IV SOLN: 1.0000 mg | INTRAVENOUS | Status: DC | PRN

## 2023-09-23 MED ORDER — BUPIVACAINE-EPINEPHRINE 0.25% -1:200000 IJ SOLN
INTRAMUSCULAR | Status: DC | PRN
  Administered 2023-09-23: 23 mL

## 2023-09-23 MED ORDER — LACTATED RINGERS IV SOLN: INTRAVENOUS | Status: DC

## 2023-09-23 MED ORDER — DEXMEDETOMIDINE HCL IN NACL 80 MCG/20ML IV SOLN
INTRAVENOUS | Status: AC
  Filled 2023-09-23: qty 20

## 2023-09-23 MED ORDER — QUETIAPINE FUMARATE 50 MG PO TABS
50.0000 mg | ORAL_TABLET | Freq: Every day | ORAL | Status: DC
  Administered 2023-09-23 – 2023-09-24 (×2): 50 mg via ORAL
  Filled 2023-09-23 (×2): qty 1

## 2023-09-23 MED ORDER — PROPOFOL 10 MG/ML IV BOLUS
INTRAVENOUS | Status: DC | PRN
  Administered 2023-09-23: 40 mg via INTRAVENOUS
  Administered 2023-09-23: 30 mg via INTRAVENOUS
  Administered 2023-09-23: 60 mg via INTRAVENOUS
  Administered 2023-09-23: 20 mg via INTRAVENOUS

## 2023-09-23 MED ORDER — ACETAMINOPHEN 500 MG PO TABS
1000.0000 mg | ORAL_TABLET | Freq: Once | ORAL | Status: AC
  Administered 2023-09-23: 1000 mg via ORAL
  Filled 2023-09-23: qty 2

## 2023-09-23 MED ORDER — PROPOFOL 10 MG/ML IV BOLUS
INTRAVENOUS | Status: AC
  Filled 2023-09-23: qty 20

## 2023-09-23 MED ORDER — ORAL CARE MOUTH RINSE: 15.0000 mL | Freq: Once | OROMUCOSAL | Status: AC

## 2023-09-23 MED ORDER — ONDANSETRON HCL 4 MG/2ML IJ SOLN
INTRAMUSCULAR | Status: DC | PRN
  Administered 2023-09-23: 4 mg via INTRAVENOUS

## 2023-09-23 MED ORDER — FENTANYL CITRATE (PF) 100 MCG/2ML IJ SOLN
25.0000 ug | INTRAMUSCULAR | Status: DC | PRN
  Administered 2023-09-23: 50 ug via INTRAVENOUS

## 2023-09-23 MED ORDER — BUPIVACAINE-EPINEPHRINE (PF) 0.25% -1:200000 IJ SOLN
INTRAMUSCULAR | Status: AC
  Filled 2023-09-23: qty 30

## 2023-09-23 MED ORDER — DEXMEDETOMIDINE HCL IN NACL 200 MCG/50ML IV SOLN
INTRAVENOUS | Status: DC | PRN
  Administered 2023-09-23: 8 ug via INTRAVENOUS

## 2023-09-23 MED ORDER — LIDOCAINE 2% (20 MG/ML) 5 ML SYRINGE
INTRAMUSCULAR | Status: AC
  Filled 2023-09-23: qty 5

## 2023-09-23 MED ORDER — LIDOCAINE 2% (20 MG/ML) 5 ML SYRINGE
INTRAMUSCULAR | Status: DC | PRN
  Administered 2023-09-23: 60 mg via INTRAVENOUS

## 2023-09-23 MED ORDER — EPHEDRINE SULFATE-NACL 50-0.9 MG/10ML-% IV SOSY
PREFILLED_SYRINGE | INTRAVENOUS | Status: DC | PRN
  Administered 2023-09-23 (×2): 5 mg via INTRAVENOUS

## 2023-09-23 MED ORDER — ROCURONIUM BROMIDE 10 MG/ML (PF) SYRINGE
PREFILLED_SYRINGE | INTRAVENOUS | Status: AC
  Filled 2023-09-23: qty 10

## 2023-09-23 SURGICAL SUPPLY — 44 items
APPLIER CLIP 5 13 M/L LIGAMAX5 (MISCELLANEOUS) ×1 IMPLANT
BAG COUNTER SPONGE SURGICOUNT (BAG) ×1 IMPLANT
BIOPATCH RED 1 DISK 7.0 (GAUZE/BANDAGES/DRESSINGS) IMPLANT
BLADE CLIPPER SURG (BLADE) IMPLANT
CABLE HIGH FREQUENCY MONO STRZ (ELECTRODE) IMPLANT
CANISTER SUCT 3000ML PPV (MISCELLANEOUS) ×1 IMPLANT
CATH REDDICK CHOLANGI 4FR 50CM (CATHETERS) ×1 IMPLANT
CHLORAPREP W/TINT 26 (MISCELLANEOUS) ×1 IMPLANT
CLIP APPLIE 5 13 M/L LIGAMAX5 (MISCELLANEOUS) ×1 IMPLANT
CNTNR URN SCR LID CUP LEK RST (MISCELLANEOUS) IMPLANT
COVER MAYO STAND STRL (DRAPES) ×1 IMPLANT
COVER SURGICAL LIGHT HANDLE (MISCELLANEOUS) ×1 IMPLANT
DERMABOND ADVANCED .7 DNX12 (GAUZE/BANDAGES/DRESSINGS) ×1 IMPLANT
DRAIN CHANNEL 19F RND (DRAIN) IMPLANT
DRAPE C-ARM 42X120 X-RAY (DRAPES) ×1 IMPLANT
DRSG TEGADERM 4X4.75 (GAUZE/BANDAGES/DRESSINGS) IMPLANT
ELECT REM PT RETURN 9FT ADLT (ELECTROSURGICAL) ×1 IMPLANT
ELECTRODE REM PT RTRN 9FT ADLT (ELECTROSURGICAL) ×1 IMPLANT
EVACUATOR SILICONE 100CC (DRAIN) IMPLANT
GLOVE BIO SURGEON STRL SZ7.5 (GLOVE) ×1 IMPLANT
GOWN STRL REUS W/ TWL LRG LVL3 (GOWN DISPOSABLE) ×3 IMPLANT
IRRIG SUCT STRYKERFLOW 2 WTIP (MISCELLANEOUS) ×1 IMPLANT
IRRIGATION SUCT STRKRFLW 2 WTP (MISCELLANEOUS) ×1 IMPLANT
IV CATH 14GX2 1/4 (CATHETERS) ×1 IMPLANT
KIT BASIN OR (CUSTOM PROCEDURE TRAY) ×1 IMPLANT
KIT IMAGING PINPOINTPAQ (MISCELLANEOUS) IMPLANT
KIT TURNOVER KIT B (KITS) ×1 IMPLANT
NS IRRIG 1000ML POUR BTL (IV SOLUTION) ×1 IMPLANT
PAD ARMBOARD POSITIONER FOAM (MISCELLANEOUS) ×1 IMPLANT
SCISSORS LAP 5X35 DISP (ENDOMECHANICALS) ×1 IMPLANT
SET TUBE SMOKE EVAC HIGH FLOW (TUBING) ×1 IMPLANT
SLEEVE Z-THREAD 5X100MM (TROCAR) ×2 IMPLANT
SPECIMEN JAR SMALL (MISCELLANEOUS) ×1 IMPLANT
SUT ETHILON 2 0 FS 18 (SUTURE) IMPLANT
SUT MNCRL AB 4-0 PS2 18 (SUTURE) ×1 IMPLANT
SYS BAG RETRIEVAL 10MM (BASKET) ×1 IMPLANT
SYSTEM BAG RETRIEVAL 10MM (BASKET) ×1 IMPLANT
TOWEL GREEN STERILE (TOWEL DISPOSABLE) ×1 IMPLANT
TOWEL GREEN STERILE FF (TOWEL DISPOSABLE) ×1 IMPLANT
TRAY LAPAROSCOPIC MC (CUSTOM PROCEDURE TRAY) ×1 IMPLANT
TROCAR BALLN 12MMX100 BLUNT (TROCAR) ×1 IMPLANT
TROCAR Z-THREAD OPTICAL 5X100M (TROCAR) ×1 IMPLANT
WARMER LAPAROSCOPE (MISCELLANEOUS) ×1 IMPLANT
WATER STERILE IRR 1000ML POUR (IV SOLUTION) ×1 IMPLANT

## 2023-09-23 NOTE — Plan of Care (Signed)
  Problem: Pain Managment: Goal: General experience of comfort will improve and/or be controlled Outcome: Progressing   Problem: Safety: Goal: Ability to remain free from injury will improve Outcome: Progressing

## 2023-09-23 NOTE — Op Note (Signed)
 09/23/2023  1:35 PM  PATIENT:  Joel Nelson  89 y.o. male  PRE-OPERATIVE DIAGNOSIS:  gallstone pancreatitis  POST-OPERATIVE DIAGNOSIS:  gallstone pancreatitis  PROCEDURE:  Procedure(s): LAPAROSCOPIC CHOLECYSTECTOMY   SURGEON:  Surgeons and Role:    * Caralyn Chandler, MD - Primary  PHYSICIAN ASSISTANT:   ASSISTANTS: none   ANESTHESIA:   local and general  EBL:  20cc   BLOOD ADMINISTERED:none  DRAINS: (1) Blake drain(s) in the gallbladder bed of liver    LOCAL MEDICATIONS USED:  MARCAINE     SPECIMEN:  Source of Specimen:  gallbladder  DISPOSITION OF SPECIMEN:  PATHOLOGY  COUNTS:  YES  TOURNIQUET:  * No tourniquets in log *  DICTATION: .Dragon Dictation    Procedure: After informed consent was obtained the patient was brought to the operating room and placed in the supine position on the operating room table. After adequate induction of general anesthesia the patient's abdomen was prepped with ChloraPrep allowed to dry and draped in usual sterile manner. An appropriate timeout was performed. The area below the umbilicus was infiltrated with quarter percent  Marcaine. A small incision was made with a 15 blade knife. The incision was carried down through the subcutaneous tissue bluntly with a hemostat and [employer] retractors. The linea alba was identified. The linea alba was incised with a 15 blade knife and each side was grasped with Coker clamps. The preperitoneal space was then probed with a hemostat until the peritoneum was opened and access was gained to the abdominal cavity. A 0 Vicryl pursestring stitch was placed in the fascia surrounding the opening. A Hassan cannula was then placed through the opening and anchored in place with the previously placed Vicryl purse string stitch. The abdomen was insufflated with carbon dioxide without difficulty. A laparoscope was inserted through the [facility] cannula in the right upper quadrant was inspected. Next the epigastric region  was infiltrated with % Marcaine. A small incision was made with a 15 blade knife. A 5 mm port was placed bluntly through this incision into the abdominal cavity under direct vision. Next 2 sites were chosen laterally on the right side of the abdomen for placement of 5 mm ports. Each of these areas was infiltrated with quarter percent Marcaine. Small stab incisions were made with a 15 blade knife. 5 mm ports were then placed bluntly through these incisions into the abdominal cavity under direct vision without difficulty. A blunt grasper was placed through the lateralmost 5 mm port and used to grasp the dome of the gallbladder and elevate it anteriorly and superiorly.  The gallbladder was severely inflamed and had to be aspirated so that we could grasp it.  There were significant adhesions to the body of the gallbladder that I was able to take down bluntly with a sucker tip.  Another blunt grasper was placed through the other 5 mm port and used to retract the body and neck of the gallbladder. A dissector was placed through the epigastric port. Blunt dissection was then carried out near the bottom of the gallbladder until the gallbladder neck-cystic duct junction was readily identified and a good window was created.  The area was severely inflamed and only a small portion of the cystic duct where it joined the gallbladder was able to be visualized.  A cholangiogram could not be obtained.  3 clips were placed proximally on the cystic duct and 1 distally and the duct was divided between the 2 sets of clips. Posterior to this the cystic  artery was identified and again dissected bluntly in a circumferential manner until a good window  was created. 2 clips were placed proximally and one distally on the artery and the artery was divided between the 2 sets of clips. Next a laparoscopic hook cautery device was used to separate the gallbladder from the liver bed. Prior to completely detaching the gallbladder from the liver bed  the liver bed was inspected and several small bleeding points were coagulated with the electrocautery until the area was completely hemostatic. The gallbladder was then detached the rest of it from the liver bed without difficulty. A laparoscopic bag was inserted through the hassan port. The laparoscope was moved to the epigastric port. The gallbladder was placed within the bag and the bag was sealed.  The bag with the gallbladder was then removed with the [facility] cannula through the infraumbilical port without difficulty.  The area was inspected again and it appeared as though the clips on the cystic duct had slipped off because of the amount of inflammation.  I was able to get 1 more clip across the cystic duct.  I then brought a 82 [place] round Blake drain into the abdominal cavity and out through the lateralmost 5 mm port.  The drain was placed in the gallbladder bed of the liver.  The drain was placed to bulb suction.  The fascial defect was then closed with the previously placed Vicryl pursestring stitch as well as with another figure-of-eight 0 Vicryl stitch. The liver bed was inspected again and found to be hemostatic. The abdomen was irrigated with copious amounts of saline until the effluent was clear. The ports were then removed under direct vision without difficulty and were found to be hemostatic. The gas was allowed to escape. No other abnormalities were noted on general inspection of the abdomen. The skin incisions were all closed with interrupted 4-0 Monocryl subcuticular stitches. Dermabond dressings were applied. The patient tolerated the procedure well. At the end of the case all needle sponge and instrument counts were correct. The patient was then awakened and taken to recovery in stable condition  PLAN OF CARE: Admit to inpatient   PATIENT DISPOSITION:  PACU - hemodynamically stable.   Delay start of Pharmacological VTE agent (>24hrs) due to surgical blood loss or risk of bleeding: no

## 2023-09-23 NOTE — Progress Notes (Signed)
 [facility] Progress Note Patient: Joel Nelson JYN:[id number] DOB: 05-01-1934 DOA: 09/18/2023  DOS: the patient was seen and examined on 09/23/2023  Brief Hospital Course: PMH of HTN, HLD, CAD, dementia, OSA on BiPAP, sick sinus syndrome SP pacemaker plan. Presented to the hospital with complaints of abdominal pain and nausea found to have acute pancreatitis as well as possible acute cholecystitis. GI was consulted.  Underwent MRCP which did not show any obstruction. General surgery consulted.  Currently plan for lap chole. Cardiology consult for preop clearance. Assessment and Plan: Acute gallstone pancreatitis. Acute cholecystitis. General surgery consulted. GI consulted. MRCP negative for any obstruction. Underwent lap chole.  Required drain placement given concern for difficulty with clip placement.  Monitor for biliary leak. Currently on IV antibiotic.  Oral thrush. Nystatin ordered.  Chest tightness. CAD. Echocardiogram ordered. Cardiology consult. Troponins are negative. EKG unremarkable for any acute ischemia. No prohibitive risk so far.  HLD. Statins and Zetia on hold.  Sick sinus syndrome with pacemaker implant. Monitor  OSA. On CPAP.  Continue.  HTN. Blood pressure medication currently on hold.  Dementia.  With delirium. Post patient had developed some delirium.  Will continue with Seroquel.  Add Ativan.  Discussed with family.  Okay to use restraint if indicated.  Continue home medication.     Subjective: Seen after the surgery.  No nausea no vomiting.  Pain well-controlled.  Required Precedex after the surgery.  Physical Exam: General: in Mild distress, No Rash Cardiovascular: S1 and S2 Present, No Murmur Respiratory: Good respiratory effort, Bilateral Air entry present. No Crackles, No wheezes Abdomen: Bowel Sound present, mild tenderness Extremities: No edema Neuro: Alert and oriented x3, no new focal deficit  Data Reviewed: I have  Reviewed nursing notes, Vitals, and Lab results. Since last encounter, pertinent lab results CBC and BMP   . I have ordered test including CBC and BMP  .   Disposition: Status is: Inpatient Remains inpatient appropriate because: Monitor for improvement in mentation and oral intake postoperatively.  heparin injection 5,000 Units Start: 09/19/23 1400 SCDs Start: 09/18/23 2043   Family Communication: Family at bedside Level of care: Telemetry Medical   Vitals:   09/23/23 1415 09/23/23 1430 09/23/23 1445 09/23/23 1855  BP: 126/68 117/62 103/62 (!) 150/77  Pulse: 81 67 66 75  Resp: 19 10 11 17   Temp:   98 F (36.7 C) 97.7 F (36.5 C)  TempSrc:    Oral  SpO2: 94% 94% 96% 95%  Weight:      Height:         Author: Charlean Congress, MD 09/23/2023 8:02 PM  Please look on [email or web address] to find out who is on call.

## 2023-09-23 NOTE — Progress Notes (Signed)
 First attempt to call report to [id number].

## 2023-09-23 NOTE — Progress Notes (Signed)
 PT Cancellation Note  Patient Details Name: Joel Nelson MRN: [id number] DOB: 1934-03-29   Cancelled Treatment:    Reason Eval/Treat Not Completed: Patient at procedure or test/unavailable - pt presently leaving room for lap chole, PT to check back at a later date.   Cadence Minton S, PT DPT Acute Rehabilitation Services Secure Chat Preferred  Office [phone]    Ethridge Herder 09/23/2023, 10:36 AM

## 2023-09-23 NOTE — Plan of Care (Signed)

## 2023-09-23 NOTE — Progress Notes (Addendum)
 Patient Name: Joel Nelson Date of Encounter: 09/23/2023 [facility] Cardiologist: None   Interval Summary  .    Denies any recent chest pain, shortness of breath, diaphoresis, nausea, vomiting, lower extremity edema, orthopnea  Vital Signs .    Vitals:   09/22/23 2035 09/22/23 2100 09/23/23 0429 09/23/23 0737  BP: (!) 174/79  134/65 (!) 146/63  Pulse: 68  72 70  Resp: 18 19 15    Temp: 98.5 F (36.9 C)  98 F (36.7 C) 98.7 F (37.1 C)  TempSrc: Oral   Oral  SpO2: 97%  (!) 87% 95%  Weight:      Height:       No intake or output data in the 24 hours ending 09/23/23 1030    09/18/2023    7:29 PM 09/18/2023    2:30 PM 08/30/2023    1:59 PM  Last 3 Weights  Weight (lbs) 173 lb 15.1 oz 190 lb 193 lb 3.2 oz  Weight (kg) 78.9 kg 86.183 kg 87.635 kg      Telemetry/ECG    Normal sinus rhythm in the 70's - Personally Reviewed  Physical Exam .   GEN: No acute distress.   Neck: No JVD Cardiac: RRR, no murmurs, rubs, or gallops.  Respiratory: Clear to auscultation bilaterally. MS: 1+ edema  Assessment & Plan .     Joel Nelson is a 89 y.o. male with a hx of CAD s/p PTCA to LcX in 1995, hypertension, hyperlipidemia, OSA, dementia, sick sinus syndrome s/p Saint Jude PPM, acute biliary pancreatitis   who is being seen 09/22/2023 for the evaluation of pre-operative risk assessment for a laparoscopic cholecystectomy with IOC at the request of Lynden Oxford MD.   Acute biliary pancreatitis Preop risk assessment Surgery was consulted and is planning to do a laparoscopic cholecystectomy with IOC tomorrow morning. Has an RCRI score of 1 this means there is a 6% perioperative risk for a major cardiac event depending on calculation used. His advanced age is also a risk factor for complication. Patient is able to do more than 4 metabolic equivalents on the Duke activity index. On interview patient who is a poor historian reported having some chest tightness.  The chest  tightness was reportedly better when taking a deep breath. The patient's wife reported that this morning was the first time the patient mentioned having the tightness and that he also had some discomfort in his groin and abdomen.  After palpating the patient's chest he stated that it was lower in the epigastric region. Suspect that this is more abdominal pain and the patient was confused and pointing to the incorrect location. He has not had any recent convincing anginal-type symptoms. No concerning findings on EKG.  Denied having shortness of breath, diaphoresis, chest pain, lower extremity edema, orthopnea, PND.  -- Echo has been done but interpretation is pending.  -- Discussed cardiac risks with patient, wife, and son and they agree that the risks are worth the benefit.  -- per Dr Antoine Poche the patient has acceptable cardiac risk to proceed with the laprascopic cholecystomy.    CAD s/p remote PTCA 1995 ASA held in anticipation of need for surgery Statin held due to elevated LFTs, can revisit as outpatient   SSS s/p STJ PPM Continue EP f/u as outpatient   Other conditions managed per primary  For questions or updates, please contact [facility] Please consult [email or web address] for contact info under        Signed, Arabella Merles,  PA-C   History and all data above reviewed.  Patient examined.  I agree with the findings as above.  The patient exam reveals   Echo:   1. Left ventricular ejection fraction, by estimation, is 55 to 60%. The  left ventricle has normal function. The left ventricle has no regional  wall motion abnormalities. Left ventricular diastolic parameters were  normal.   2. Right ventricular systolic function is normal. The right ventricular  size is normal.   3. The mitral valve is normal in structure. Mild mitral valve  regurgitation. No evidence of mitral stenosis.   4. The aortic valve is tricuspid. Aortic valve regurgitation is not  visualized. Aortic valve  sclerosis is present, with no evidence of aortic  valve stenosis.   COR:RRR  ,  Lungs: Clear  ,  Abd: Positive bowel sounds, no rebound no guarding, Mild incisional tenderness. Ext No edema   .  All available labs, radiology testing, previous records reviewed. Agree with documented assessment and plan.   CAD:  No cardiac issues noted post op.  No change in therapy or further cardiac testing.  Echo as above.  Please call with further questions.    Royston Cornea Lovelace Cerveny  5:28 PM  09/23/2023

## 2023-09-23 NOTE — Progress Notes (Signed)
*   Day of Surgery *   Subjective/Chief Complaint: No complaints.  Echo scheduled for this morning   Objective: Vital signs in last 24 hours: Temp:  [98 F (36.7 C)-98.7 F (37.1 C)] 98.7 F (37.1 C) (04/17 0737) Pulse Rate:  [68-72] 70 (04/17 0737) Resp:  [15-19] 15 (04/17 0429) BP: (134-174)/(63-79) 146/63 (04/17 0737) SpO2:  [87 %-97 %] 95 % (04/17 0737) Last BM Date : 09/21/23  Intake/Output from previous day: 04/16 0701 - 04/17 0700 In: 480 [P.O.:480] Out: -  Intake/Output this shift: No intake/output data recorded.  General appearance: alert and cooperative Resp: clear to auscultation bilaterally Cardio: regular rate and rhythm GI: soft, nontender  Lab Results:  Recent Labs    09/22/23 0759 09/23/23 0603  WBC 7.4 8.0  HGB 13.0 13.0  HCT 39.0 38.9*  PLT 210 209   BMET Recent Labs    09/22/23 0759 09/23/23 0603  NA 136 137  K 3.6 3.7  CL 104 102  CO2 22 21*  GLUCOSE 88 89  BUN 12 12  CREATININE 0.88 0.88  CALCIUM 8.1* 8.4*   PT/INR No results for input(s): "LABPROT", "INR" in the last 72 hours. ABG No results for input(s): "PHART", "HCO3" in the last 72 hours.  Invalid input(s): "PCO2", "PO2"  Studies/Results: DG CHEST PORT 1 VIEW Result Date: 09/22/2023 CLINICAL DATA:  Preop EXAM: PORTABLE CHEST 1 VIEW COMPARISON:  03/25/2016. FINDINGS: The heart size and mediastinal contours are within normal limits. No pneumothorax or pleural effusion. Calcified aorta. Linear bibasilar subsegmental atelectasis or scarring. The visualized skeletal structures are unremarkable. There is a left-sided pacer. IMPRESSION: Bibasilar subsegmental atelectasis or scarring. Electronically Signed   By: Sydell Eva M.D.   On: 09/22/2023 21:17    Anti-infectives: Anti-infectives (From admission, onward)    Start     Dose/Rate Route Frequency Ordered Stop   09/20/23 0600  piperacillin-tazobactam (ZOSYN) IVPB 3.375 g        3.375 g 12.5 mL/hr over 240 Minutes  Intravenous Every 8 hours 09/20/23 0129     09/19/23 2000  piperacillin-tazobactam (ZOSYN) IVPB 3.375 g  Status:  Discontinued        3.375 g 12.5 mL/hr over 240 Minutes Intravenous Every 8 hours 09/19/23 1030 09/20/23 0129   09/19/23 1130  piperacillin-tazobactam (ZOSYN) IVPB 3.375 g        3.375 g 100 mL/hr over 30 Minutes Intravenous  Once 09/19/23 1030 09/19/23 1224       Assessment/Plan: s/p Procedure(s): LAPAROSCOPIC CHOLECYSTECTOMY WITH INTRAOPERATIVE CHOLANGIOGRAM (N/A) Echo this a.m. If cleared by cardiology then plan for laparoscopic cholecystectomy with possible intraoperative cholangiogram for later in the morning.  I have discussed with him in detail the risk and benefits of the operation as well as some of the technical aspects including the risk of common bile duct injury and he understands and wishes to proceed.  LOS: 5 days    Joel Nelson III 09/23/2023

## 2023-09-23 NOTE — Anesthesia Preprocedure Evaluation (Signed)
 Anesthesia Evaluation  Patient identified by MRN, date of birth, ID band Patient confused    Reviewed: Allergy & Precautions, NPO status , Patient's Chart, lab work & pertinent test results  Airway Mallampati: II  TM Distance: >3 FB Neck ROM: Full    Dental no notable dental hx.    Pulmonary sleep apnea and Continuous Positive Airway Pressure Ventilation    Pulmonary exam normal        Cardiovascular hypertension, Pt. on medications + CAD  + pacemaker  Rhythm:Regular Rate:Normal  IMPRESSIONS    1. Left ventricular ejection fraction, by estimation, is 55 to 60%. The left ventricle has normal function. The left ventricle has no regional wall motion abnormalities. Left ventricular diastolic parameters were normal.  2. Right ventricular systolic function is normal. The right ventricular size is normal.  3. The mitral valve is normal in structure. Mild mitral valve regurgitation. No evidence of mitral stenosis.  4. The aortic valve is tricuspid. Aortic valve regurgitation is not visualized. Aortic valve sclerosis is present, with no evidence of aortic valve stenosis.    Neuro/Psych       Dementia negative neurological ROS     GI/Hepatic Neg liver ROS,,,Gallstone pancreatitis    Endo/Other    Renal/GU negative Renal ROS  negative genitourinary   Musculoskeletal   Abdominal Normal abdominal exam  (+)   Peds  Hematology   Anesthesia Other Findings   Reproductive/Obstetrics                             Anesthesia Physical Anesthesia Plan  ASA: 3  Anesthesia Plan: General   Post-op Pain Management: Celebrex PO (pre-op)* and Tylenol PO (pre-op)*   Induction: Intravenous  PONV Risk Score and Plan: 2 and Ondansetron, Dexamethasone and Treatment may vary due to age or medical condition  Airway Management Planned: Mask and Oral ETT  Additional Equipment: None  Intra-op Plan:   Post-operative  Plan: Extubation in OR  Informed Consent: I have reviewed the patients History and Physical, chart, labs and discussed the procedure including the risks, benefits and alternatives for the proposed anesthesia with the patient or authorized representative who has indicated his/her understanding and acceptance.     Dental advisory given and Consent reviewed with POA  Plan Discussed with: CRNA  Anesthesia Plan Comments:        Anesthesia Quick Evaluation

## 2023-09-23 NOTE — Transfer of Care (Signed)
 Immediate Anesthesia Transfer of Care Note  Patient: Joel Nelson  Procedure(s) Performed: LAPAROSCOPIC CHOLECYSTECTOMY  Patient Location: PACU  Anesthesia Type:General  Level of Consciousness: drowsy  Airway & Oxygen Therapy: Patient Spontanous Breathing and Patient connected to face mask oxygen  Post-op Assessment: Report given to RN and Post -op Vital signs reviewed and stable  Post vital signs: Reviewed and stable  Last Vitals:  Vitals Value Taken Time  BP 111/65 09/23/23 1347  Temp    Pulse 70 09/23/23 1349  Resp 16 09/23/23 1349  SpO2 99 % 09/23/23 1349  Vitals shown include unfiled device data.  Last Pain:  Vitals:   09/23/23 1119  TempSrc:   PainSc: 0-No pain         Complications: No notable events documented.

## 2023-09-23 NOTE — Anesthesia Postprocedure Evaluation (Signed)
 Anesthesia Post Note  Patient: Joel Nelson  Procedure(s) Performed: LAPAROSCOPIC CHOLECYSTECTOMY     Patient location during evaluation: PACU Anesthesia Type: General Level of consciousness: confused Pain management: pain level controlled Vital Signs Assessment: post-procedure vital signs reviewed and stable Respiratory status: spontaneous breathing, nonlabored ventilation, respiratory function stable and patient connected to nasal cannula oxygen Cardiovascular status: blood pressure returned to baseline and stable Postop Assessment: no apparent nausea or vomiting Anesthetic complications: no Comments: - patient with baseline dementia woke up confused requiring precedex 20mcg for relaxation and redirection with wife at the bedside.   No notable events documented.  Last Vitals:  Vitals:   09/23/23 1430 09/23/23 1445  BP: 117/62 103/62  Pulse: 67 66  Resp: 10 11  Temp:  36.7 C  SpO2: 94% 96%    Last Pain:  Vitals:   09/23/23 1445  TempSrc:   PainSc: 0-No pain                 Theotis Flake P Lisbet Busker

## 2023-09-23 NOTE — Progress Notes (Signed)
  Echocardiogram 2D Echocardiogram has been performed.  Fain Home RDCS 09/23/2023, 8:34 AM

## 2023-09-23 NOTE — Anesthesia Procedure Notes (Signed)
 Procedure Name: Intubation Date/Time: 09/23/2023 12:17 PM  Performed by: Deneice Finland, CRNAPre-anesthesia Checklist: Patient identified, Emergency Drugs available, Suction available and Patient being monitored Patient Re-evaluated:Patient Re-evaluated prior to induction Oxygen Delivery Method: Circle system utilized Preoxygenation: Pre-oxygenation with 100% oxygen Induction Type: IV induction Ventilation: Mask ventilation without difficulty Laryngoscope Size: Mac and 4 Grade View: Grade I Tube type: Oral Tube size: 7.5 mm Number of attempts: 1 Airway Equipment and Method: Stylet and Oral airway Placement Confirmation: ETT inserted through vocal cords under direct vision, positive ETCO2 and breath sounds checked- equal and bilateral Secured at: 22 cm Tube secured with: Tape Dental Injury: Teeth and Oropharynx as per pre-operative assessment

## 2023-09-24 ENCOUNTER — Encounter (HOSPITAL_COMMUNITY): Payer: Self-pay | Admitting: General Surgery

## 2023-09-24 DIAGNOSIS — K851 Biliary acute pancreatitis without necrosis or infection: Secondary | ICD-10-CM | POA: Diagnosis not present

## 2023-09-24 LAB — CBC
HCT: 39.5 % (ref 39.0–52.0)
Hemoglobin: 12.8 g/dL — ABNORMAL LOW (ref 13.0–17.0)
MCH: 29.4 pg (ref 26.0–34.0)
MCHC: 32.4 g/dL (ref 30.0–36.0)
MCV: 90.8 fL (ref 80.0–100.0)
Platelets: 232 10*3/uL (ref 150–400)
RBC: 4.35 MIL/uL (ref 4.22–5.81)
RDW: 13.3 % (ref 11.5–15.5)
WBC: 9.9 10*3/uL (ref 4.0–10.5)
nRBC: 0 % (ref 0.0–0.2)

## 2023-09-24 LAB — COMPREHENSIVE METABOLIC PANEL WITH GFR
ALT: 96 U/L — ABNORMAL HIGH (ref 0–44)
AST: 71 U/L — ABNORMAL HIGH (ref 15–41)
Albumin: 2.9 g/dL — ABNORMAL LOW (ref 3.5–5.0)
Alkaline Phosphatase: 145 U/L — ABNORMAL HIGH (ref 38–126)
Anion gap: 12 (ref 5–15)
BUN: 12 mg/dL (ref 8–23)
CO2: 23 mmol/L (ref 22–32)
Calcium: 8.4 mg/dL — ABNORMAL LOW (ref 8.9–10.3)
Chloride: 103 mmol/L (ref 98–111)
Creatinine, Ser: 0.86 mg/dL (ref 0.61–1.24)
GFR, Estimated: >60 mL/min (ref >60)
Glucose, Bld: 154 mg/dL — ABNORMAL HIGH (ref 70–99)
Potassium: 4.1 mmol/L (ref 3.5–5.1)
Sodium: 138 mmol/L (ref 135–145)
Total Bilirubin: 1.2 mg/dL (ref 0.0–1.2)
Total Protein: 5.9 g/dL — ABNORMAL LOW (ref 6.5–8.1)

## 2023-09-24 LAB — SURGICAL PATHOLOGY

## 2023-09-24 LAB — MAGNESIUM: Magnesium: 2.2 mg/dL (ref 1.7–2.4)

## 2023-09-24 MED ORDER — DOCUSATE SODIUM 100 MG PO CAPS
100.0000 mg | ORAL_CAPSULE | Freq: Every day | ORAL | Status: DC
  Administered 2023-09-24: 100 mg via ORAL
  Filled 2023-09-24: qty 1

## 2023-09-24 NOTE — Progress Notes (Signed)
 [facility] Progress Note Patient: Joel Nelson WUJ:[id number] DOB: [birth year] DOA: 09/18/2023  DOS: the patient was seen and examined on 09/24/2023  Brief Hospital Course: PMH of HTN, HLD, CAD, dementia, OSA on BiPAP, sick sinus syndrome SP pacemaker plan. Presented to the hospital with complaints of abdominal pain and nausea found to have acute pancreatitis as well as possible acute cholecystitis. GI was consulted.  Underwent MRCP which did not show any obstruction. General surgery consulted.  Currently plan for lap chole. Cardiology consult for preop clearance. Assessment and Plan: Acute gallstone pancreatitis. Acute cholecystitis. General surgery consulted. GI consulted. MRCP negative for any obstruction. Underwent lap chole.  Required drain placement given concern for difficulty with clip placement.  Monitor for biliary leak.  Currently on IV antibiotic.  Oral thrush. Nystatin  ordered.  Chest tightness. CAD. Echocardiogram ordered. Cardiology consult. Troponins are negative. EKG unremarkable for any acute ischemia. No prohibitive risk so far.  HLD. Statins and Zetia on hold.  Sick sinus syndrome with pacemaker implant. Monitor  OSA. On CPAP.  Continue.  HTN. Blood pressure medication currently on hold.  Dementia.  With delirium. Post op had developed some delirium.  Will continue with Seroquel .  Mentation significantly better.  Back to baseline.  Continue home medication.     Subjective: No nausea no vomiting no fever no chills.  Oral intake adequate.  Physical Exam: General: in Mild distress, No Rash Cardiovascular: S1 and S2 Present, No Murmur Respiratory: Good respiratory effort, Bilateral Air entry present. No Crackles, No wheezes Abdomen: Bowel Sound present, mild right-sided tenderness Extremities: No edema Neuro: Alert and oriented x3, no new focal deficit  Data Reviewed: I have Reviewed nursing notes, Vitals, and Lab results. Since last  encounter, pertinent lab results CBC and BMP   . I have ordered test including CBC and BMP  .   Disposition: Status is: Inpatient Remains inpatient appropriate because: Monitor for improvement in abdominal pain and drain output  heparin  injection 5,000 Units Start: 09/19/23 1400 SCDs Start: 09/18/23 2043   Family Communication: Wife at bedside Level of care: Telemetry Medical   Vitals:   09/24/23 0512 09/24/23 0857 09/24/23 1617 09/24/23 1929  BP: 109/61 118/61 (!) 122/54 (!) 161/69  Pulse: (!) 59 (!) 59 62 81  Resp:  17 17   Temp: 97.9 F (36.6 C) 98 F (36.7 C) 98.1 F (36.7 C) 98.7 F (37.1 C)  TempSrc: Oral Oral Oral Oral  SpO2: 93% 96% 97% 100%  Weight:      Height:         Author: Charlean Congress, MD 09/24/2023 7:33 PM  Please look on [email or web address] to find out who is on call.

## 2023-09-24 NOTE — Progress Notes (Signed)
 Assessment & Plan: POD#1 - status post lap cholecystectomy - Dr. Alethea Andes 09/23/2023  Advance to soft diet this AM  Encouraged OOB, ambulation  Likely ready for discharge tomorrow from surgical standpoint  Per medical service        Oralee Billow, MD [facility] practice Office: [phone]        Chief Complaint: Biliary pancreatitis  Subjective: Patient up in room, family at bedside.  Tolerating liquids.  Objective: Vital signs in last 24 hours: Temp:  [97.7 F (36.5 C)-98.2 F (36.8 C)] 97.9 F (36.6 C) (04/18 0512) Pulse Rate:  [59-81] 59 (04/18 0512) Resp:  [10-19] 17 (04/17 1855) BP: (103-150)/(61-81) 109/61 (04/18 0512) SpO2:  [90 %-99 %] 93 % (04/18 0512) Weight:  [85.5 kg] 85.5 kg (04/17 1057) Last BM Date : 09/18/23  Intake/Output from previous day: 04/17 0701 - 04/18 0700 In: 1390 [P.O.:240; I.V.:900; IV Piggyback:250] Out: 115 [Drains:100; Blood:15] Intake/Output this shift: No intake/output data recorded.  Physical Exam: HEENT - sclerae clear, mucous membranes moist Abdomen - soft, wounds dry and intact with Dermabond; JP drain with thin serosanguinous, no bile  Lab Results:  Recent Labs    09/23/23 0603 09/24/23 0324  WBC 8.0 9.9  HGB 13.0 12.8*  HCT 38.9* 39.5  PLT 209 232   BMET Recent Labs    09/23/23 0603 09/24/23 0324  NA 137 138  K 3.7 4.1  CL 102 103  CO2 21* 23  GLUCOSE 89 154*  BUN 12 12  CREATININE 0.88 0.86  CALCIUM 8.4* 8.4*   PT/INR No results for input(s): "LABPROT", "INR" in the last 72 hours. Comprehensive Metabolic Panel:    Component Value Date/Time   NA 138 09/24/2023 0324   NA 137 09/23/2023 0603   K 4.1 09/24/2023 0324   K 3.7 09/23/2023 0603   CL 103 09/24/2023 0324   CL 102 09/23/2023 0603   CO2 23 09/24/2023 0324   CO2 21 (L) 09/23/2023 0603   BUN 12 09/24/2023 0324   BUN 12 09/23/2023 0603   CREATININE 0.86 09/24/2023 0324   CREATININE 0.88 09/23/2023 0603   GLUCOSE 154  (H) 09/24/2023 0324   GLUCOSE 89 09/23/2023 0603   CALCIUM 8.4 (L) 09/24/2023 0324   CALCIUM 8.4 (L) 09/23/2023 0603   AST 71 (H) 09/24/2023 0324   AST 39 09/23/2023 0603   ALT 96 (H) 09/24/2023 0324   ALT 87 (H) 09/23/2023 0603   ALKPHOS 145 (H) 09/24/2023 0324   ALKPHOS 159 (H) 09/23/2023 0603   BILITOT 1.2 09/24/2023 0324   BILITOT 1.5 (H) 09/23/2023 0603   PROT 5.9 (L) 09/24/2023 0324   PROT 5.8 (L) 09/23/2023 0603   ALBUMIN  2.9 (L) 09/24/2023 0324   ALBUMIN  2.7 (L) 09/23/2023 0603    Studies/Results: ECHOCARDIOGRAM COMPLETE Result Date: 09/23/2023    ECHOCARDIOGRAM REPORT   Patient Name:   LEROY PETTWAY Date of Exam: 09/23/2023 Medical Rec #:  [id number]      Height:       68.0 in Accession #:    [id number]     Weight:       173.9 lb Date of Birth:  July 18, 1933      BSA:          1.926 m Patient Age:    89 years       BP:           134/65 mmHg Patient Gender: M  HR:           71 bpm. Exam Location:  Inpatient Procedure: 2D Echo, Cardiac Doppler and Color Doppler (Both Spectral and Color            Flow Doppler were utilized during procedure). Indications:    Chest Pain R07.9  History:        Patient has no prior history of Echocardiogram examinations.  Sonographer:    Hersey Lorenzo RDCS Referring Phys: Art Bigness PATEL IMPRESSIONS  1. Left ventricular ejection fraction, by estimation, is 55 to 60%. The left ventricle has normal function. The left ventricle has no regional wall motion abnormalities. Left ventricular diastolic parameters were normal.  2. Right ventricular systolic function is normal. The right ventricular size is normal.  3. The mitral valve is normal in structure. Mild mitral valve regurgitation. No evidence of mitral stenosis.  4. The aortic valve is tricuspid. Aortic valve regurgitation is not visualized. Aortic valve sclerosis is present, with no evidence of aortic valve stenosis. FINDINGS  Left Ventricle: Left ventricular ejection fraction, by estimation, is 55 to  60%. The left ventricle has normal function. The left ventricle has no regional wall motion abnormalities. The left ventricular internal cavity size was normal in size. There is  no left ventricular hypertrophy. Left ventricular diastolic parameters were normal. Right Ventricle: The right ventricular size is normal. No increase in right ventricular wall thickness. Right ventricular systolic function is normal. Left Atrium: Left atrial size was normal in size. Right Atrium: Right atrial size was normal in size. Pericardium: There is no evidence of pericardial effusion. Mitral Valve: The mitral valve is normal in structure. Mild mitral valve regurgitation. No evidence of mitral valve stenosis. Tricuspid Valve: The tricuspid valve is normal in structure. Tricuspid valve regurgitation is not demonstrated. No evidence of tricuspid stenosis. Aortic Valve: The aortic valve is tricuspid. Aortic valve regurgitation is not visualized. Aortic valve sclerosis is present, with no evidence of aortic valve stenosis. Pulmonic Valve: The pulmonic valve was normal in structure. Pulmonic valve regurgitation is not visualized. No evidence of pulmonic stenosis. Aorta: The aortic root is normal in size and structure. Venous: The inferior vena cava was not well visualized. IAS/Shunts: No atrial level shunt detected by color flow Doppler.  LEFT VENTRICLE PLAX 2D LVIDd:         5.60 cm   Diastology LVIDs:         3.50 cm   LV e' medial:    7.40 cm/s LV PW:         1.00 cm   LV E/e' medial:  8.6 LV IVS:        1.00 cm   LV e' lateral:   8.92 cm/s LVOT diam:     2.20 cm   LV E/e' lateral: 7.1 LV SV:         75 LV SV Index:   39 LVOT Area:     3.80 cm  RIGHT VENTRICLE TAPSE (M-mode): 2.6 cm LEFT ATRIUM             Index        RIGHT ATRIUM           Index LA diam:        3.50 cm 1.82 cm/m   RA Area:     15.60 cm LA Vol (A2C):   22.4 ml 11.63 ml/m  RA Volume:   42.40 ml  22.01 ml/m LA Vol (A4C):   36.4 ml 18.90 ml/m LA Biplane  Vol: 30.2  ml 15.68 ml/m  AORTIC VALVE LVOT Vmax:   101.00 cm/s LVOT Vmean:  60.300 cm/s LVOT VTI:    0.197 m  AORTA Ao Root diam: 2.90 cm Ao Asc diam:  3.50 cm MITRAL VALVE MV Area (PHT): 3.15 cm    SHUNTS MV Decel Time: 241 msec    Systemic VTI:  0.20 m MV E velocity: 63.40 cm/s  Systemic Diam: 2.20 cm MV A velocity: 94.30 cm/s MV E/A ratio:  0.67 Arta Lark Electronically signed by Arta Lark Signature Date/Time: 09/23/2023/10:43:27 AM    Final    DG CHEST PORT 1 VIEW Result Date: 09/22/2023 CLINICAL DATA:  Preop EXAM: PORTABLE CHEST 1 VIEW COMPARISON:  03/25/2016. FINDINGS: The heart size and mediastinal contours are within normal limits. No pneumothorax or pleural effusion. Calcified aorta. Linear bibasilar subsegmental atelectasis or scarring. The visualized skeletal structures are unremarkable. There is a left-sided pacer. IMPRESSION: Bibasilar subsegmental atelectasis or scarring. Electronically Signed   By: Sydell Eva M.D.   On: 09/22/2023 21:17      Oralee Billow 09/24/2023  Patient ID: Darylene Epley, male   DOB: June 25, 1933, 89 y.o.   MRN: [id number]

## 2023-09-24 NOTE — Plan of Care (Signed)

## 2023-09-24 NOTE — Progress Notes (Signed)
 Mobility Specialist Progress Note:   09/24/23 1256  Mobility  Activity Ambulated with assistance in hallway  Level of Assistance Standby assist, set-up cues, supervision of patient - no hands on  Assistive Device Front wheel walker  Distance Ambulated (ft) 150 ft  Activity Response Tolerated well  Mobility Referral Yes  Mobility visit 1 Mobility  Mobility Specialist Start Time (ACUTE ONLY) 1155  Mobility Specialist Stop Time (ACUTE ONLY) 1205  Mobility Specialist Time Calculation (min) (ACUTE ONLY) 10 min   Pt agreeable and eager to ambulate in hallway. No physical assist required during ambulation, asx throughout. Pt returned to chair with all needs met. Family present.   Brown Husband  Mobility Specialist Please contact via [employer] office at [phone]

## 2023-09-24 NOTE — Plan of Care (Signed)
  Problem: Pain Managment: Goal: General experience of comfort will improve and/or be controlled Outcome: Progressing   Problem: Safety: Goal: Ability to remain free from injury will improve Outcome: Progressing

## 2023-09-25 ENCOUNTER — Inpatient Hospital Stay (HOSPITAL_COMMUNITY)

## 2023-09-25 DIAGNOSIS — K859 Acute pancreatitis without necrosis or infection, unspecified: Secondary | ICD-10-CM | POA: Diagnosis not present

## 2023-09-25 LAB — COMPREHENSIVE METABOLIC PANEL WITH GFR
ALT: 103 U/L — ABNORMAL HIGH (ref 0–44)
AST: 66 U/L — ABNORMAL HIGH (ref 15–41)
Albumin: 3.3 g/dL — ABNORMAL LOW (ref 3.5–5.0)
Alkaline Phosphatase: 153 U/L — ABNORMAL HIGH (ref 38–126)
Anion gap: 10 (ref 5–15)
BUN: 13 mg/dL (ref 8–23)
CO2: 26 mmol/L (ref 22–32)
Calcium: 8.6 mg/dL — ABNORMAL LOW (ref 8.9–10.3)
Chloride: 101 mmol/L (ref 98–111)
Creatinine, Ser: 1.06 mg/dL (ref 0.61–1.24)
GFR, Estimated: >60 mL/min (ref >60)
Glucose, Bld: 145 mg/dL — ABNORMAL HIGH (ref 70–99)
Potassium: 3.4 mmol/L — ABNORMAL LOW (ref 3.5–5.1)
Sodium: 137 mmol/L (ref 135–145)
Total Bilirubin: 1.4 mg/dL — ABNORMAL HIGH (ref 0.0–1.2)
Total Protein: 6.8 g/dL (ref 6.5–8.1)

## 2023-09-25 LAB — CBC
HCT: 43.4 % (ref 39.0–52.0)
Hemoglobin: 14.1 g/dL (ref 13.0–17.0)
MCH: 30.1 pg (ref 26.0–34.0)
MCHC: 32.5 g/dL (ref 30.0–36.0)
MCV: 92.7 fL (ref 80.0–100.0)
Platelets: 305 10*3/uL (ref 150–400)
RBC: 4.68 MIL/uL (ref 4.22–5.81)
RDW: 13.7 % (ref 11.5–15.5)
WBC: 10 10*3/uL (ref 4.0–10.5)
nRBC: 0 % (ref 0.0–0.2)

## 2023-09-25 LAB — MAGNESIUM: Magnesium: 1.9 mg/dL (ref 1.7–2.4)

## 2023-09-25 MED ORDER — DOCUSATE SODIUM 100 MG PO CAPS
100.0000 mg | ORAL_CAPSULE | Freq: Two times a day (BID) | ORAL | Status: DC
  Administered 2023-09-25: 100 mg via ORAL
  Filled 2023-09-25: qty 1

## 2023-09-25 MED ORDER — QUETIAPINE FUMARATE 25 MG PO TABS: 25.0000 mg | ORAL_TABLET | Freq: Every day | ORAL | 0 refills | Status: AC

## 2023-09-25 MED ORDER — ASPIRIN EC 81 MG PO TBEC: 81.0000 mg | DELAYED_RELEASE_TABLET | Freq: Every day | ORAL | Status: AC

## 2023-09-25 MED ORDER — DOCUSATE SODIUM 100 MG PO CAPS: 100.0000 mg | ORAL_CAPSULE | Freq: Two times a day (BID) | ORAL | 0 refills | Status: AC

## 2023-09-25 MED ORDER — SIMETHICONE 80 MG PO CHEW
160.0000 mg | CHEWABLE_TABLET | Freq: Once | ORAL | Status: DC
  Filled 2023-09-25: qty 2

## 2023-09-25 MED ORDER — POTASSIUM CHLORIDE CRYS ER 20 MEQ PO TBCR
40.0000 meq | EXTENDED_RELEASE_TABLET | Freq: Once | ORAL | Status: AC
  Administered 2023-09-25: 40 meq via ORAL
  Filled 2023-09-25: qty 2

## 2023-09-25 MED ORDER — OXYCODONE HCL 5 MG PO TABS: 5.0000 mg | ORAL_TABLET | Freq: Four times a day (QID) | ORAL | 0 refills | Status: AC | PRN

## 2023-09-25 MED ORDER — NYSTATIN 100000 UNIT/ML MT SUSP: 5.0000 mL | Freq: Four times a day (QID) | OROMUCOSAL | 0 refills | Status: AC

## 2023-09-25 MED ORDER — SIMETHICONE 80 MG PO CHEW: 80.0000 mg | CHEWABLE_TABLET | Freq: Four times a day (QID) | ORAL | Status: DC

## 2023-09-25 MED ORDER — SIMETHICONE 80 MG PO CHEW: 80.0000 mg | CHEWABLE_TABLET | Freq: Four times a day (QID) | ORAL | 0 refills | Status: AC

## 2023-09-25 MED ORDER — BISACODYL 10 MG RE SUPP
10.0000 mg | Freq: Once | RECTAL | Status: DC
  Filled 2023-09-25: qty 1

## 2023-09-25 MED ORDER — AMOXICILLIN-POT CLAVULANATE 875-125 MG PO TABS: 1.0000 | ORAL_TABLET | Freq: Two times a day (BID) | ORAL | 0 refills | Status: AC

## 2023-09-25 MED ORDER — POLYETHYLENE GLYCOL 3350 17 G PO PACK: 17.0000 g | PACK | Freq: Every day | ORAL | 0 refills | Status: AC

## 2023-09-25 NOTE — Progress Notes (Signed)
 Assessment & Plan: POD#2 - status post lap cholecystectomy - Dr. Alethea Andes 09/23/2023             Tolerating soft diet             Encouraged OOB, ambulation             Drain with serous output - no sign of bile leak  Drain to remain one week - will remove at office  OK for discharge from surgical standpoint        Oralee Billow, MD [facility] A [facility] practice Office: [phone]        Chief Complaint: Gallstone pancreatitis  Subjective: Patient sleeping, family in room.  Tolerating diet.  Objective: Vital signs in last 24 hours: Temp:  [98 F (36.7 C)-98.7 F (37.1 C)] 98.1 F (36.7 C) (04/19 0447) Pulse Rate:  [59-81] 80 (04/19 0447) Resp:  [17] 17 (04/19 0447) BP: (118-161)/(54-85) 146/85 (04/19 0447) SpO2:  [92 %-100 %] 92 % (04/19 0447) Last BM Date : 09/18/23  Intake/Output from previous day: 04/18 0701 - 04/19 0700 In: 220 [P.O.:220] Out: 60 [Drains:60] Intake/Output this shift: No intake/output data recorded.  Physical Exam: HEENT - sclerae clear, mucous membranes moist Abdomen - softer, less distended; JP drain with serous fluid  Lab Results:  Recent Labs    09/24/23 0324 09/25/23 0546  WBC 9.9 10.0  HGB 12.8* 14.1  HCT 39.5 43.4  PLT 232 305   BMET Recent Labs    09/24/23 0324 09/25/23 0546  NA 138 137  K 4.1 3.4*  CL 103 101  CO2 23 26  GLUCOSE 154* 145*  BUN 12 13  CREATININE 0.86 1.06  CALCIUM 8.4* 8.6*   PT/INR No results for input(s): "LABPROT", "INR" in the last 72 hours. Comprehensive Metabolic Panel:    Component Value Date/Time   NA 137 09/25/2023 0546   NA 138 09/24/2023 0324   K 3.4 (L) 09/25/2023 0546   K 4.1 09/24/2023 0324   CL 101 09/25/2023 0546   CL 103 09/24/2023 0324   CO2 26 09/25/2023 0546   CO2 23 09/24/2023 0324   BUN 13 09/25/2023 0546   BUN 12 09/24/2023 0324   CREATININE 1.06 09/25/2023 0546   CREATININE 0.86 09/24/2023 0324   GLUCOSE 145 (H) 09/25/2023 0546   GLUCOSE 154 (H)  09/24/2023 0324   CALCIUM 8.6 (L) 09/25/2023 0546   CALCIUM 8.4 (L) 09/24/2023 0324   AST 66 (H) 09/25/2023 0546   AST 71 (H) 09/24/2023 0324   ALT 103 (H) 09/25/2023 0546   ALT 96 (H) 09/24/2023 0324   ALKPHOS 153 (H) 09/25/2023 0546   ALKPHOS 145 (H) 09/24/2023 0324   BILITOT 1.4 (H) 09/25/2023 0546   BILITOT 1.2 09/24/2023 0324   PROT 6.8 09/25/2023 0546   PROT 5.9 (L) 09/24/2023 0324   ALBUMIN  3.3 (L) 09/25/2023 0546   ALBUMIN  2.9 (L) 09/24/2023 0324    Studies/Results: ECHOCARDIOGRAM COMPLETE Result Date: 09/23/2023    ECHOCARDIOGRAM REPORT   Patient Name:   Joel Nelson Date of Exam: 09/23/2023 Medical Rec #:  [id number]      Height:       68.0 in Accession #:    [id number]     Weight:       173.9 lb Date of Birth:  May 24, 1934      BSA:          1.926 m Patient Age:    87 years  BP:           134/65 mmHg Patient Gender: M              HR:           71 bpm. Exam Location:  Inpatient Procedure: 2D Echo, Cardiac Doppler and Color Doppler (Both Spectral and Color            Flow Doppler were utilized during procedure). Indications:    Chest Pain R07.9  History:        Patient has no prior history of Echocardiogram examinations.  Sonographer:    Hersey Lorenzo RDCS Referring Phys: Art Bigness PATEL IMPRESSIONS  1. Left ventricular ejection fraction, by estimation, is 55 to 60%. The left ventricle has normal function. The left ventricle has no regional wall motion abnormalities. Left ventricular diastolic parameters were normal.  2. Right ventricular systolic function is normal. The right ventricular size is normal.  3. The mitral valve is normal in structure. Mild mitral valve regurgitation. No evidence of mitral stenosis.  4. The aortic valve is tricuspid. Aortic valve regurgitation is not visualized. Aortic valve sclerosis is present, with no evidence of aortic valve stenosis. FINDINGS  Left Ventricle: Left ventricular ejection fraction, by estimation, is 55 to 60%. The left ventricle has  normal function. The left ventricle has no regional wall motion abnormalities. The left ventricular internal cavity size was normal in size. There is  no left ventricular hypertrophy. Left ventricular diastolic parameters were normal. Right Ventricle: The right ventricular size is normal. No increase in right ventricular wall thickness. Right ventricular systolic function is normal. Left Atrium: Left atrial size was normal in size. Right Atrium: Right atrial size was normal in size. Pericardium: There is no evidence of pericardial effusion. Mitral Valve: The mitral valve is normal in structure. Mild mitral valve regurgitation. No evidence of mitral valve stenosis. Tricuspid Valve: The tricuspid valve is normal in structure. Tricuspid valve regurgitation is not demonstrated. No evidence of tricuspid stenosis. Aortic Valve: The aortic valve is tricuspid. Aortic valve regurgitation is not visualized. Aortic valve sclerosis is present, with no evidence of aortic valve stenosis. Pulmonic Valve: The pulmonic valve was normal in structure. Pulmonic valve regurgitation is not visualized. No evidence of pulmonic stenosis. Aorta: The aortic root is normal in size and structure. Venous: The inferior vena cava was not well visualized. IAS/Shunts: No atrial level shunt detected by color flow Doppler.  LEFT VENTRICLE PLAX 2D LVIDd:         5.60 cm   Diastology LVIDs:         3.50 cm   LV e' medial:    7.40 cm/s LV PW:         1.00 cm   LV E/e' medial:  8.6 LV IVS:        1.00 cm   LV e' lateral:   8.92 cm/s LVOT diam:     2.20 cm   LV E/e' lateral: 7.1 LV SV:         75 LV SV Index:   39 LVOT Area:     3.80 cm  RIGHT VENTRICLE TAPSE (M-mode): 2.6 cm LEFT ATRIUM             Index        RIGHT ATRIUM           Index LA diam:        3.50 cm 1.82 cm/m   RA Area:     15.60 cm LA  Vol [facility]):   22.4 ml 11.63 ml/m  RA Volume:   42.40 ml  22.01 ml/m LA Vol (A4C):   36.4 ml 18.90 ml/m LA Biplane Vol: 30.2 ml 15.68 ml/m  AORTIC VALVE  LVOT Vmax:   101.00 cm/s LVOT Vmean:  60.300 cm/s LVOT VTI:    0.197 m  AORTA Ao Root diam: 2.90 cm Ao Asc diam:  3.50 cm MITRAL VALVE MV Area (PHT): 3.15 cm    SHUNTS MV Decel Time: 241 msec    Systemic VTI:  0.20 m MV E velocity: 63.40 cm/s  Systemic Diam: 2.20 cm MV A velocity: 94.30 cm/s MV E/A ratio:  0.67 Arta Lark Electronically signed by Arta Lark Signature Date/Time: 09/23/2023/10:43:27 AM    Final       Oralee Billow 09/25/2023  Patient ID: Darylene Epley, male   DOB: 10-18-1933, 89 y.o.   MRN: [id number]

## 2023-09-25 NOTE — Plan of Care (Signed)
  Problem: Pain Managment: Goal: General experience of comfort will improve and/or be controlled Outcome: Progressing   Problem: Safety: Goal: Ability to remain free from injury will improve Outcome: Progressing

## 2023-09-25 NOTE — TOC Transition Note (Addendum)
 Transition of Care [facility]) - Discharge Note   Patient Details  Name: Joel Nelson MRN: [id number] Date of Birth: 1934/01/25  [facility] CM/SW Contact:  Jannine Meo, RN Phone Number: 09/25/2023, 10:51 AM   Clinical Narrative:   Patient is being discharged today. Order for rolling walker noted. Spoke with Wife at bedside, agrees to receive rolling walker. DME ordered through [employer] weekend staff to be delivered to bedside before patient is discharged home.  1300: Per floor nurse , family refusing rolling walker at this time.    Final next level of care: Home/Self Care Barriers to Discharge: No Barriers Identified   Patient Goals and CMS Choice            Discharge Placement                       Discharge Plan and Services Additional resources added to the After Visit Summary for                  DME Arranged: Walker rolling DME Agency: [employer] Date DME Agency Contacted: 09/25/23 Time DME Agency Contacted: 1045 Representative spoke with at DME Agency: Weekend staff            Social Drivers of Health (SDOH) Interventions SDOH Screenings   Food Insecurity: No Food Insecurity (09/18/2023)  Housing: Low Risk  (09/18/2023)  Transportation Needs: No Transportation Needs (09/18/2023)  Utilities: Not At Risk (09/18/2023)  Social Connections: Socially Integrated (09/18/2023)  Tobacco Use: Low Risk  (09/23/2023)     Readmission Risk Interventions     No data to display

## 2023-09-26 NOTE — Discharge Summary (Signed)
 Physician Discharge Summary   Patient: Joel Nelson MRN: [id number] DOB: [birth year]  Admit date:     09/18/2023  Discharge date: 09/25/2023  Discharge Physician: Charlean Congress  PCP: No primary care provider on file.  Recommendations at discharge: Follow-up with PCP in 1 week. Follow-up with general surgery as recommended. Recheck CMP to look at kidney numbers.   Follow-up Information     [facility], [place]. Schedule an appointment as soon as possible for a visit in 6 day(s).   Specialty: General Surgery Why: For wound re-check and drain removal. Contact information: [address] [place]  [id number] [phone]        PCP. Schedule an appointment as soon as possible for a visit in 2 week(s).   Why: with CMP lab to look at kidney and electrolytes               Discharge Diagnoses: Principal Problem:   Acute pancreatitis Active Problems:   Coronary artery disease involving native coronary artery of native heart without angina pectoris   Sick sinus syndrome (HCC)   OSA treated with BiPAP   Benign essential HTN   Elevated liver enzymes   Calculus of gallbladder with acute cholecystitis without obstruction   Gallstone pancreatitis    Hospital Course: PMH of HTN, HLD, CAD, dementia, OSA on BiPAP, sick sinus syndrome SP pacemaker plan. Presented to the hospital with complaints of abdominal pain and nausea found to have acute pancreatitis as well as possible acute cholecystitis. GI was consulted.  Underwent MRCP which did not show any obstruction. General surgery consulted. Cardiology consult for preop clearance. Underwent lap chole with drain placement.  Assessment and Plan: Acute gallstone pancreatitis. Acute cholecystitis. General surgery consulted. GI consulted. MRCP negative for any obstruction. Underwent lap chole.  Required drain placement given concern for difficulty with clip placement.  Monitor for biliary  leak.  Patient will be discharged on oral antibiotic with JP drain with close follow-up.  Oral thrush. Nystatin  ordered.  Chest tightness. CAD. Echocardiogram shows 55 to 60% EF. Cardiology consult. Troponins are negative. EKG unremarkable for any acute ischemia. No prohibitive risk so far.  HLD. Statins and Zetia on hold.  Resume on discharge with  Sick sinus syndrome with pacemaker implant. Monitor  OSA. On CPAP.  Continue.  HTN. Blood pressure medication currently on hold.  Dementia.  With delirium. Post op had developed some delirium.  Will continue with Seroquel .  Mentation significantly better.  Back to baseline.  Continue home medication.     Pain control - [place]  Controlled Substance Reporting System database was reviewed. and patient was instructed, not to drive, operate heavy machinery, perform activities at heights, swimming or participation in water activities or provide baby-sitting services while on Pain, Sleep and Anxiety Medications; until their outpatient Physician has advised to do so again. Also recommended to not to take more than prescribed Pain, Sleep and Anxiety Medications.  Consultants:  General Surgery Cardiology  Procedures performed:  Echocardiogram  Lap chole  DISCHARGE MEDICATION: Allergies as of 09/25/2023   No Known Allergies      Medication List     TAKE these medications    acetaminophen  500 MG tablet Commonly known as: TYLENOL  Take 1 tablet (500 mg total) by mouth every 6 (six) hours as needed.   amoxicillin -clavulanate 875-125 MG tablet Commonly known as: AUGMENTIN  Take 1 tablet by mouth 2 (two) times daily for 7 days.   aspirin  EC 81 MG tablet Take 1 tablet (  81 mg total) by mouth daily. What changed: how much to take   cyanocobalamin  1000 MCG tablet Commonly known as: VITAMIN B12 Take 1,000 mcg by mouth every morning.   docusate sodium  100 MG capsule Commonly known as: COLACE Take 1 capsule (100 mg  total) by mouth 2 (two) times daily.   ezetimibe 10 MG tablet Commonly known as: ZETIA Take 10 mg by mouth daily.   fluticasone 50 MCG/ACT nasal spray Commonly known as: FLONASE Place 1 spray into both nostrils daily as needed for allergies.   losartan  25 MG tablet Commonly known as: COZAAR  Take 25 mg by mouth daily.   memantine  10 MG tablet Commonly known as: NAMENDA  Take 10 mg by mouth daily.   Multi-Vitamins Tabs Take 1 tablet by mouth daily.   nitroGLYCERIN  0.4 MG SL tablet Commonly known as: NITROSTAT  Place 0.4 mg under the tongue every 5 (five) minutes as needed for chest pain.   nystatin  100000 UNIT/ML suspension Commonly known as: MYCOSTATIN  Take 5 mLs (500,000 Units total) by mouth 4 (four) times daily for 7 days.   oxyCODONE  5 MG immediate release tablet Commonly known as: Oxy IR/ROXICODONE  Take 1 tablet (5 mg total) by mouth every 6 (six) hours as needed for moderate pain (pain score 4-6) or severe pain (pain score 7-10).   pantoprazole  40 MG tablet Commonly known as: PROTONIX  Take 40 mg by mouth daily.   polyethylene glycol 17 g packet Commonly known as: MiraLax  Take 17 g by mouth daily.   QUEtiapine  25 MG tablet Commonly known as: SEROQUEL  Take 1 tablet (25 mg total) by mouth at bedtime.   rivastigmine  9.5 mg/24hr Commonly known as: EXELON  Place 9.5 mg onto the skin daily.   rosuvastatin 5 MG tablet Commonly known as: CRESTOR Take 5 mg by mouth daily.   sertraline  50 MG tablet Commonly known as: ZOLOFT  Take 25 mg by mouth at bedtime.   simethicone  80 MG chewable tablet Commonly known as: MYLICON Chew 1 tablet (80 mg total) by mouth 4 (four) times daily.   Systane 0.4-0.3 % Soln Generic drug: Polyethyl Glycol-Propyl Glycol Apply 1 drop to eye daily as needed (for dry eye).   tamsulosin  0.4 MG Caps capsule Commonly known as: FLOMAX  Take 0.4 mg by mouth daily.       Disposition: Home Diet recommendation: Cardiac diet  Discharge  Exam: Vitals:   09/24/23 1617 09/24/23 1929 09/25/23 0447 09/25/23 0847  BP: (!) 122/54 (!) 161/69 (!) 146/85 133/65  Pulse: 62 81 80 78  Resp: 17  17 17   Temp: 98.1 F (36.7 C) 98.7 F (37.1 C) 98.1 F (36.7 C) 98.3 F (36.8 C)  TempSrc: Oral Oral Oral Oral  SpO2: 97% 100% 92% 96%  Weight:      Height:       General: Appear in mild distress; no visible Abnormal Neck Mass Or lumps, Conjunctiva normal Cardiovascular: S1 and S2 Present, no Murmur, Respiratory: good respiratory effort, Bilateral Air entry present and CTA, no Crackles, no wheezes Abdomen: Bowel Sound present, RUQ tenderness Extremities: no Pedal edema Neurology: alert and oriented to time, place, and person  Filed Weights   09/18/23 1430 09/18/23 1929 09/23/23 1057  Weight: 86.2 kg 78.9 kg 85.5 kg   Condition at discharge: stable  The results of significant diagnostics from this hospitalization (including imaging, microbiology, ancillary and laboratory) are listed below for reference.   Imaging Studies: DG Abd Portable 1V Result Date: 09/25/2023 CLINICAL DATA:  Abdominal distension. EXAM: PORTABLE ABDOMEN - 1  VIEW COMPARISON:  09/18/2023 FINDINGS: Surgical clips noted within the right upper quadrant of the abdomen. Right upper quadrant surgical drainage catheter is noted. No dilated loops of large or small bowel identified. IMPRESSION: Nonobstructive bowel gas pattern. Status post cholecystectomy with surgical drainage catheter in the right upper quadrant of the abdomen. Electronically Signed   By: Kimberley Penman M.D.   On: 09/25/2023 13:28   ECHOCARDIOGRAM COMPLETE Result Date: 09/23/2023    ECHOCARDIOGRAM REPORT   Patient Name:   AUBERY DOUTHAT Date of Exam: 09/23/2023 Medical Rec #:  [id number]      Height:       68.0 in Accession #:    [id number]     Weight:       173.9 lb Date of Birth:  Sep 23, 1933      BSA:          1.926 m Patient Age:    89 years       BP:           134/65 mmHg Patient Gender: M              HR:            71 bpm. Exam Location:  Inpatient Procedure: 2D Echo, Cardiac Doppler and Color Doppler (Both Spectral and Color            Flow Doppler were utilized during procedure). Indications:    Chest Pain R07.9  History:        Patient has no prior history of Echocardiogram examinations.  Sonographer:    Hersey Lorenzo RDCS Referring Phys: Art Bigness [place] IMPRESSIONS  1. Left ventricular ejection fraction, by estimation, is 55 to 60%. The left ventricle has normal function. The left ventricle has no regional wall motion abnormalities. Left ventricular diastolic parameters were normal.  2. Right ventricular systolic function is normal. The right ventricular size is normal.  3. The mitral valve is normal in structure. Mild mitral valve regurgitation. No evidence of mitral stenosis.  4. The aortic valve is tricuspid. Aortic valve regurgitation is not visualized. Aortic valve sclerosis is present, with no evidence of aortic valve stenosis. FINDINGS  Left Ventricle: Left ventricular ejection fraction, by estimation, is 55 to 60%. The left ventricle has normal function. The left ventricle has no regional wall motion abnormalities. The left ventricular internal cavity size was normal in size. There is  no left ventricular hypertrophy. Left ventricular diastolic parameters were normal. Right Ventricle: The right ventricular size is normal. No increase in right ventricular wall thickness. Right ventricular systolic function is normal. Left Atrium: Left atrial size was normal in size. Right Atrium: Right atrial size was normal in size. Pericardium: There is no evidence of pericardial effusion. Mitral Valve: The mitral valve is normal in structure. Mild mitral valve regurgitation. No evidence of mitral valve stenosis. Tricuspid Valve: The tricuspid valve is normal in structure. Tricuspid valve regurgitation is not demonstrated. No evidence of tricuspid stenosis. Aortic Valve: The aortic valve is tricuspid. Aortic valve  regurgitation is not visualized. Aortic valve sclerosis is present, with no evidence of aortic valve stenosis. Pulmonic Valve: The pulmonic valve was normal in structure. Pulmonic valve regurgitation is not visualized. No evidence of pulmonic stenosis. Aorta: The aortic root is normal in size and structure. Venous: The inferior vena cava was not well visualized. IAS/Shunts: No atrial level shunt detected by color flow Doppler.  LEFT VENTRICLE PLAX 2D LVIDd:         5.60 cm   Diastology  LVIDs:         3.50 cm   LV e' medial:    7.40 cm/s LV PW:         1.00 cm   LV E/e' medial:  8.6 LV IVS:        1.00 cm   LV e' lateral:   8.92 cm/s LVOT diam:     2.20 cm   LV E/e' lateral: 7.1 LV SV:         75 LV SV Index:   39 LVOT Area:     3.80 cm  RIGHT VENTRICLE TAPSE (M-mode): 2.6 cm LEFT ATRIUM             Index        RIGHT ATRIUM           Index LA diam:        3.50 cm 1.82 cm/m   RA Area:     15.60 cm LA Vol (A2C):   22.4 ml 11.63 ml/m  RA Volume:   42.40 ml  22.01 ml/m LA Vol (A4C):   36.4 ml 18.90 ml/m LA Biplane Vol: 30.2 ml 15.68 ml/m  AORTIC VALVE LVOT Vmax:   101.00 cm/s LVOT Vmean:  60.300 cm/s LVOT VTI:    0.197 m  AORTA Ao Root diam: 2.90 cm Ao Asc diam:  3.50 cm MITRAL VALVE MV Area (PHT): 3.15 cm    SHUNTS MV Decel Time: 241 msec    Systemic VTI:  0.20 m MV E velocity: 63.40 cm/s  Systemic Diam: 2.20 cm MV A velocity: 94.30 cm/s MV E/A ratio:  0.67 Arta Lark Electronically signed by Arta Lark Signature Date/Time: 09/23/2023/10:43:27 AM    Final    DG CHEST PORT 1 VIEW Result Date: 09/22/2023 CLINICAL DATA:  Preop EXAM: PORTABLE CHEST 1 VIEW COMPARISON:  03/25/2016. FINDINGS: The heart size and mediastinal contours are within normal limits. No pneumothorax or pleural effusion. Calcified aorta. Linear bibasilar subsegmental atelectasis or scarring. The visualized skeletal structures are unremarkable. There is a left-sided pacer. IMPRESSION: Bibasilar subsegmental atelectasis or scarring.  Electronically Signed   By: Sydell Eva M.D.   On: 09/22/2023 21:17   MR ABDOMEN MRCP W WO CONTAST Result Date: 09/20/2023 CLINICAL DATA:  Acute pancreatitis.  Elevated liver enzymes EXAM: MRI ABDOMEN WITHOUT AND WITH CONTRAST (INCLUDING MRCP) TECHNIQUE: Multiplanar multisequence MR imaging of the abdomen was performed both before and after the administration of intravenous contrast. Heavily T2-weighted images of the biliary and pancreatic ducts were obtained, and three-dimensional MRCP images were rendered by post processing. CONTRAST:  8mL GADAVIST  GADOBUTROL  1 MMOL/ML IV SOLN COMPARISON:  CT 09/18/2023, ultrasound 09/19/2023 FINDINGS: Lower chest:  Lung bases are clear. Hepatobiliary: There is no intrahepatic biliary duct dilatation. Common hepatic duct and common bile duct normal caliber. No choledocholithiasis. Small amount sludge and stones collect towards the neck of the gallbladder (image 27/series 9). No gallbladder wall thickening or pericholecystic fluid. Opposed phase imaging demonstrates marked hepatic steatosis (series 501 and 502)) Pancreas: Pancreatic inflammation better appreciated on CT. No pancreatic duct dilatation. No variant ductal anatomy. There are several side branch cystic lesions along the body and tail the pancreas. For example 8 mm cystic lesion on image 24/9. Two small cystic lesions measuring 6 mm on image 24/9. Potential cyst in the tail the pancreas measuring 11 mm. There is small amount of fluid along the LEFT anterior pararenal fascia related pancreatitis. No organized fluid collections. Postcontrast T1 images are severely degraded respiratory motion which is  typical in patients with active pancreatitis. Spleen: Normal spleen. Adrenals/urinary tract: Adrenal glands and kidneys are normal. Stomach/Bowel: Stomach and limited of the small bowel is unremarkable Vascular/Lymphatic: Abdominal aortic normal caliber. No retroperitoneal periportal lymphadenopathy. Musculoskeletal:  No aggressive osseous lesion IMPRESSION: 1. No choledocholithiasis. No biliary duct dilatation. 2. Small amount of sludge and stones in the gallbladder. No evidence acute cholecystitis. 3. Marked hepatic steatosis. 4. Acute pancreatitis better appreciated on CT. No organized collections. 5. Several side branch cystic lesions in the body and tail the pancreas. Favor pseudocysts versus side branch IPMN. Recommend follow-up MRI in 2 years. Electronically Signed   By: Deboraha Fallow M.D.   On: 09/20/2023 18:34   MR 3D Recon At Scanner Result Date: 09/20/2023 CLINICAL DATA:  Acute pancreatitis.  Elevated liver enzymes EXAM: MRI ABDOMEN WITHOUT AND WITH CONTRAST (INCLUDING MRCP) TECHNIQUE: Multiplanar multisequence MR imaging of the abdomen was performed both before and after the administration of intravenous contrast. Heavily T2-weighted images of the biliary and pancreatic ducts were obtained, and three-dimensional MRCP images were rendered by post processing. CONTRAST:  8mL GADAVIST  GADOBUTROL  1 MMOL/ML IV SOLN COMPARISON:  CT 09/18/2023, ultrasound 09/19/2023 FINDINGS: Lower chest:  Lung bases are clear. Hepatobiliary: There is no intrahepatic biliary duct dilatation. Common hepatic duct and common bile duct normal caliber. No choledocholithiasis. Small amount sludge and stones collect towards the neck of the gallbladder (image 27/series 9). No gallbladder wall thickening or pericholecystic fluid. Opposed phase imaging demonstrates marked hepatic steatosis (series 501 and 502)) Pancreas: Pancreatic inflammation better appreciated on CT. No pancreatic duct dilatation. No variant ductal anatomy. There are several side branch cystic lesions along the body and tail the pancreas. For example 8 mm cystic lesion on image 24/9. Two small cystic lesions measuring 6 mm on image 24/9. Potential cyst in the tail the pancreas measuring 11 mm. There is small amount of fluid along the LEFT anterior pararenal fascia related  pancreatitis. No organized fluid collections. Postcontrast T1 images are severely degraded respiratory motion which is typical in patients with active pancreatitis. Spleen: Normal spleen. Adrenals/urinary tract: Adrenal glands and kidneys are normal. Stomach/Bowel: Stomach and limited of the small bowel is unremarkable Vascular/Lymphatic: Abdominal aortic normal caliber. No retroperitoneal periportal lymphadenopathy. Musculoskeletal: No aggressive osseous lesion IMPRESSION: 1. No choledocholithiasis. No biliary duct dilatation. 2. Small amount of sludge and stones in the gallbladder. No evidence acute cholecystitis. 3. Marked hepatic steatosis. 4. Acute pancreatitis better appreciated on CT. No organized collections. 5. Several side branch cystic lesions in the body and tail the pancreas. Favor pseudocysts versus side branch IPMN. Recommend follow-up MRI in 2 years. Electronically Signed   By: Deboraha Fallow M.D.   On: 09/20/2023 18:34   US  Abdomen Limited RUQ (LIVER/GB) Result Date: 09/19/2023 CLINICAL DATA:  [id number] Pancreatitis [id number] EXAM: ULTRASOUND ABDOMEN LIMITED RIGHT UPPER QUADRANT COMPARISON:  CT abdomen pelvis 09/18/2023 FINDINGS: Gallbladder: Echogenic layering material within the gallbladder lumen. No gallstones. Mild wall thickening visualized and trace pericholecystic fluid. No sonographic Murphy sign noted by sonographer. Common bile duct: Diameter: 4 mm. Liver: No focal lesion identified. Increased parenchymal echogenicity. Portal vein is patent on color Doppler imaging with normal direction of blood flow towards the liver. Other: None. IMPRESSION: 1. Gallbladder sludge with mild gallbladder wall thickening and trace pericholecystic fluid. Negative sonographic versus sign. Equivocal findings. Correlate clinically for acute cholecystitis. 2. Hepatic steatosis. Please note limited evaluation for focal hepatic masses in a patient with hepatic steatosis due to decreased penetration of the acoustic  ultrasound  waves. Electronically Signed   By: Morgane  Naveau M.D.   On: 09/19/2023 11:11   CT ABDOMEN PELVIS W CONTRAST Result Date: 09/18/2023 CLINICAL DATA:  Abdominal pain, acute, nonlocalized EXAM: CT ABDOMEN AND PELVIS WITH CONTRAST TECHNIQUE: Multidetector CT imaging of the abdomen and pelvis was performed using the standard protocol following bolus administration of intravenous contrast. RADIATION DOSE REDUCTION: This exam was performed according to the departmental dose-optimization program which includes automated exposure control, adjustment of the mA and/or kV according to patient size and/or use of iterative reconstruction technique. CONTRAST:  OMNIPAQUE  IOHEXOL  300 MG/ML  SOLN COMPARISON:  None Available. FINDINGS: Lower chest: No focal airspace consolidation or pleural effusion.Right atrium and right ventricle pacemaker wires partially visualized. Dense multi-vessel coronary atherosclerosis. Hepatobiliary: No mass. Mild hepatic steatosis.No radiopaque stones or wall thickening of the gallbladder.No intrahepatic or extrahepatic biliary ductal dilation.The portal veins are patent. Pancreas: No mass. Regions of parenchymal atrophy with subtle ductal dilation present. Parenchymal calcifications also noted. These findings are suggestive of chronic pancreatitis. There is moderate inflammatory stranding surrounding the pancreatic body and tail with acute peripancreatic fluid extending into the anterior left pararenal space in the left paracolic gutter. The inflammatory stranding extends around the pancreatic head, uncinate process, and the second portion of the duodenum.No well-formed or drainable peripancreatic fluid collection. Spleen: Normal size. No mass. Adrenals/Urinary Tract: No adrenal masses. multiple bilateral hypodensities are noted in both kidneys, some of which are too small to definitively characterize, but likely small cysts. no nephrolithiasis or hydronephrosis. The urinary bladder  is distended without focal abnormality. Stomach/Bowel: The stomach is decompressed without focal abnormality. No small bowel wall thickening or inflammation. No small bowel obstruction. Normal appendix. Descending and sigmoid colonic diverticulosis. No changes of acute diverticulitis. Vascular/Lymphatic: No aortic aneurysm. Descending and sigmoid colonic diverticulosis. No changes of acute diverticulitis. No intraabdominal or pelvic lymphadenopathy. Reproductive: Severe prostatomegaly.No free pelvic fluid. Other: No pneumoperitoneum or ascites. Musculoskeletal: No acute fracture or destructive lesion.Multilevel degenerative disc disease of the spine. Mild, grade 1 anterolisthesis of L4 on L5 due to bilateral pars interarticularis defects at L4. IMPRESSION: 1. Findings consistent with acute interstitial edematous pancreatitis. Correlation with serum lipase recommended. Underlying findings of chronic pancreatitis also noted. No well-formed or drainable peripancreatic fluid collection. 2. Descending and sigmoid colonic diverticulosis. No changes of acute diverticulitis. 3. Hepatic steatosis. 4. Severe prostatomegaly. Electronically Signed   By: Rance Burrows M.D.   On: 09/18/2023 17:19    Microbiology: Results for orders placed or performed during the hospital encounter of 09/18/23  Surgical pcr screen     Status: None   Collection Time: 09/22/23  5:34 PM   Specimen: Nasal Mucosa; Nasal Swab  Result Value Ref Range Status   MRSA, PCR NEGATIVE NEGATIVE Final   Staphylococcus aureus NEGATIVE NEGATIVE Final    Comment: (NOTE) The Xpert SA Assay (FDA approved for NASAL specimens in patients 60 years of age and older), is one component of a comprehensive surveillance program. It is not intended to diagnose infection nor to guide or monitor treatment. Performed at [facility], [address] [place], [place] [address]    Labs: CBC: Recent Labs  Lab 09/22/23 0759 09/23/23 0603 09/24/23 0324  09/25/23 0546  WBC 7.4 8.0 9.9 10.0  HGB 13.0 13.0 12.8* 14.1  HCT 39.0 38.9* 39.5 43.4  MCV 89.2 90.0 90.8 92.7  PLT 210 209 232 305   Basic Metabolic Panel: Recent Labs  Lab 09/21/23 1819 09/22/23 0759 09/23/23 0603 09/24/23  0324 09/25/23 0546  NA 134* 136 137 138 137  K 3.7 3.6 3.7 4.1 3.4*  CL 99 104 102 103 101  CO2 25 22 21* 23 26  GLUCOSE 85 88 89 154* 145*  BUN 12 12 12 12 13   CREATININE 0.86 0.88 0.88 0.86 1.06  CALCIUM 8.3* 8.1* 8.4* 8.4* 8.6*  MG  --   --   --  2.2 1.9   Liver Function Tests: Recent Labs  Lab 09/21/23 1819 09/22/23 0759 09/23/23 0603 09/24/23 0324 09/25/23 0546  AST 50* 42* 39 71* 66*  ALT 127* 101* 87* 96* 103*  ALKPHOS 166* 152* 159* 145* 153*  BILITOT 2.0* 1.8* 1.5* 1.2 1.4*  PROT 6.1* 5.8* 5.8* 5.9* 6.8  ALBUMIN  3.0* 2.8* 2.7* 2.9* 3.3*   CBG: No results for input(s): "GLUCAP" in the last 168 hours.  Discharge time spent: greater than 30 minutes.  Author: Charlean Congress, MD  [facility] 09/25/2023

## 2023-10-05 DIAGNOSIS — K8001 Calculus of gallbladder with acute cholecystitis with obstruction: Secondary | ICD-10-CM | POA: Diagnosis not present

## 2023-10-05 DIAGNOSIS — Z13220 Encounter for screening for lipoid disorders: Secondary | ICD-10-CM | POA: Diagnosis not present

## 2023-10-13 ENCOUNTER — Ambulatory Visit (INDEPENDENT_AMBULATORY_CARE_PROVIDER_SITE_OTHER): Payer: Medicare PPO

## 2023-10-13 DIAGNOSIS — I495 Sick sinus syndrome: Secondary | ICD-10-CM

## 2023-10-13 LAB — CUP PACEART REMOTE DEVICE CHECK
Battery Remaining Longevity: 55 mo
Battery Remaining Percentage: 42 %
Battery Voltage: 2.98 V
Brady Statistic AP VP Percent: 1 %
Brady Statistic AP VS Percent: 1 %
Brady Statistic AS VP Percent: 1 %
Brady Statistic AS VS Percent: 99 %
Brady Statistic RA Percent Paced: 1 %
Brady Statistic RV Percent Paced: 1 %
Date Time Interrogation Session: 20250507020015
Implantable Lead Connection Status: 753985
Implantable Lead Connection Status: 753985
Implantable Lead Implant Date: 20171017
Implantable Lead Implant Date: 20171017
Implantable Lead Location: 753859
Implantable Lead Location: 753860
Implantable Pulse Generator Implant Date: 20171017
Lead Channel Impedance Value: 380 Ohm
Lead Channel Impedance Value: 440 Ohm
Lead Channel Pacing Threshold Amplitude: 0.5 V
Lead Channel Pacing Threshold Amplitude: 1 V
Lead Channel Pacing Threshold Pulse Width: 0.5 ms
Lead Channel Pacing Threshold Pulse Width: 0.5 ms
Lead Channel Sensing Intrinsic Amplitude: 2.4 mV
Lead Channel Sensing Intrinsic Amplitude: 6 mV
Lead Channel Setting Pacing Amplitude: 1.5 V
Lead Channel Setting Pacing Amplitude: 2.5 V
Lead Channel Setting Pacing Pulse Width: 0.5 ms
Lead Channel Setting Sensing Sensitivity: 2 mV
Pulse Gen Model: 2272
Pulse Gen Serial Number: 7946710

## 2023-10-18 DIAGNOSIS — G4733 Obstructive sleep apnea (adult) (pediatric): Secondary | ICD-10-CM | POA: Diagnosis not present

## 2023-10-21 DIAGNOSIS — M6281 Muscle weakness (generalized): Secondary | ICD-10-CM | POA: Diagnosis not present

## 2023-10-22 ENCOUNTER — Ambulatory Visit: Payer: Self-pay | Admitting: Cardiovascular Disease

## 2023-10-25 DIAGNOSIS — K2100 Gastro-esophageal reflux disease with esophagitis, without bleeding: Secondary | ICD-10-CM | POA: Diagnosis not present

## 2023-10-25 DIAGNOSIS — I10 Essential (primary) hypertension: Secondary | ICD-10-CM | POA: Diagnosis not present

## 2023-10-25 DIAGNOSIS — E782 Mixed hyperlipidemia: Secondary | ICD-10-CM | POA: Diagnosis not present

## 2023-10-25 DIAGNOSIS — M6281 Muscle weakness (generalized): Secondary | ICD-10-CM | POA: Diagnosis not present

## 2023-10-25 DIAGNOSIS — G301 Alzheimer's disease with late onset: Secondary | ICD-10-CM | POA: Diagnosis not present

## 2023-10-27 DIAGNOSIS — E7849 Other hyperlipidemia: Secondary | ICD-10-CM | POA: Diagnosis not present

## 2023-10-27 DIAGNOSIS — I10 Essential (primary) hypertension: Secondary | ICD-10-CM | POA: Diagnosis not present

## 2023-10-27 DIAGNOSIS — E7800 Pure hypercholesterolemia, unspecified: Secondary | ICD-10-CM | POA: Diagnosis not present

## 2023-10-27 DIAGNOSIS — E1122 Type 2 diabetes mellitus with diabetic chronic kidney disease: Secondary | ICD-10-CM | POA: Diagnosis not present

## 2023-10-27 DIAGNOSIS — E782 Mixed hyperlipidemia: Secondary | ICD-10-CM | POA: Diagnosis not present

## 2023-11-03 DIAGNOSIS — G301 Alzheimer's disease with late onset: Secondary | ICD-10-CM | POA: Diagnosis not present

## 2023-11-03 DIAGNOSIS — E782 Mixed hyperlipidemia: Secondary | ICD-10-CM | POA: Diagnosis not present

## 2023-11-03 DIAGNOSIS — Z6828 Body mass index (BMI) 28.0-28.9, adult: Secondary | ICD-10-CM | POA: Diagnosis not present

## 2023-11-03 DIAGNOSIS — N401 Enlarged prostate with lower urinary tract symptoms: Secondary | ICD-10-CM | POA: Diagnosis not present

## 2023-11-03 DIAGNOSIS — I10 Essential (primary) hypertension: Secondary | ICD-10-CM | POA: Diagnosis not present

## 2023-11-05 DIAGNOSIS — E782 Mixed hyperlipidemia: Secondary | ICD-10-CM | POA: Diagnosis not present

## 2023-11-05 DIAGNOSIS — I10 Essential (primary) hypertension: Secondary | ICD-10-CM | POA: Diagnosis not present

## 2023-11-05 DIAGNOSIS — E1122 Type 2 diabetes mellitus with diabetic chronic kidney disease: Secondary | ICD-10-CM | POA: Diagnosis not present

## 2023-11-16 DIAGNOSIS — M6281 Muscle weakness (generalized): Secondary | ICD-10-CM | POA: Diagnosis not present

## 2023-11-17 DIAGNOSIS — Z6828 Body mass index (BMI) 28.0-28.9, adult: Secondary | ICD-10-CM | POA: Diagnosis not present

## 2023-11-17 DIAGNOSIS — J0100 Acute maxillary sinusitis, unspecified: Secondary | ICD-10-CM | POA: Diagnosis not present

## 2023-11-18 DIAGNOSIS — G4733 Obstructive sleep apnea (adult) (pediatric): Secondary | ICD-10-CM | POA: Diagnosis not present

## 2023-11-23 DIAGNOSIS — J0100 Acute maxillary sinusitis, unspecified: Secondary | ICD-10-CM | POA: Diagnosis not present

## 2023-11-23 DIAGNOSIS — M6281 Muscle weakness (generalized): Secondary | ICD-10-CM | POA: Diagnosis not present

## 2023-11-23 DIAGNOSIS — Z6828 Body mass index (BMI) 28.0-28.9, adult: Secondary | ICD-10-CM | POA: Diagnosis not present

## 2023-11-23 NOTE — Progress Notes (Signed)
 Remote pacemaker transmission.

## 2023-11-29 DIAGNOSIS — M6281 Muscle weakness (generalized): Secondary | ICD-10-CM | POA: Diagnosis not present

## 2023-12-02 DIAGNOSIS — J069 Acute upper respiratory infection, unspecified: Secondary | ICD-10-CM | POA: Diagnosis not present

## 2023-12-02 DIAGNOSIS — R051 Acute cough: Secondary | ICD-10-CM | POA: Diagnosis not present

## 2023-12-02 DIAGNOSIS — Z6828 Body mass index (BMI) 28.0-28.9, adult: Secondary | ICD-10-CM | POA: Diagnosis not present

## 2023-12-06 DIAGNOSIS — I10 Essential (primary) hypertension: Secondary | ICD-10-CM | POA: Diagnosis not present

## 2023-12-06 DIAGNOSIS — E782 Mixed hyperlipidemia: Secondary | ICD-10-CM | POA: Diagnosis not present

## 2023-12-06 DIAGNOSIS — E1122 Type 2 diabetes mellitus with diabetic chronic kidney disease: Secondary | ICD-10-CM | POA: Diagnosis not present

## 2023-12-18 DIAGNOSIS — G4733 Obstructive sleep apnea (adult) (pediatric): Secondary | ICD-10-CM | POA: Diagnosis not present

## 2023-12-28 DIAGNOSIS — H903 Sensorineural hearing loss, bilateral: Secondary | ICD-10-CM | POA: Diagnosis not present

## 2023-12-28 DIAGNOSIS — H6122 Impacted cerumen, left ear: Secondary | ICD-10-CM | POA: Diagnosis not present

## 2023-12-29 DIAGNOSIS — G4733 Obstructive sleep apnea (adult) (pediatric): Secondary | ICD-10-CM | POA: Diagnosis not present

## 2024-01-06 DIAGNOSIS — E782 Mixed hyperlipidemia: Secondary | ICD-10-CM | POA: Diagnosis not present

## 2024-01-06 DIAGNOSIS — E1122 Type 2 diabetes mellitus with diabetic chronic kidney disease: Secondary | ICD-10-CM | POA: Diagnosis not present

## 2024-01-06 DIAGNOSIS — I10 Essential (primary) hypertension: Secondary | ICD-10-CM | POA: Diagnosis not present

## 2024-01-12 ENCOUNTER — Ambulatory Visit (INDEPENDENT_AMBULATORY_CARE_PROVIDER_SITE_OTHER): Payer: Medicare PPO

## 2024-01-12 DIAGNOSIS — I495 Sick sinus syndrome: Secondary | ICD-10-CM

## 2024-01-12 LAB — CUP PACEART REMOTE DEVICE CHECK
Battery Remaining Longevity: 53 mo
Battery Remaining Percentage: 40 %
Battery Voltage: 2.98 V
Brady Statistic AP VP Percent: 1 %
Brady Statistic AP VS Percent: 1 %
Brady Statistic AS VP Percent: 1 %
Brady Statistic AS VS Percent: 99 %
Brady Statistic RA Percent Paced: 1 %
Brady Statistic RV Percent Paced: 1 %
Date Time Interrogation Session: 20250806020015
Implantable Lead Connection Status: 753985
Implantable Lead Connection Status: 753985
Implantable Lead Implant Date: 20171017
Implantable Lead Implant Date: 20171017
Implantable Lead Location: 753859
Implantable Lead Location: 753860
Implantable Pulse Generator Implant Date: 20171017
Lead Channel Impedance Value: 390 Ohm
Lead Channel Impedance Value: 430 Ohm
Lead Channel Pacing Threshold Amplitude: 0.5 V
Lead Channel Pacing Threshold Amplitude: 1 V
Lead Channel Pacing Threshold Pulse Width: 0.5 ms
Lead Channel Pacing Threshold Pulse Width: 0.5 ms
Lead Channel Sensing Intrinsic Amplitude: 2.1 mV
Lead Channel Sensing Intrinsic Amplitude: 5.5 mV
Lead Channel Setting Pacing Amplitude: 1.5 V
Lead Channel Setting Pacing Amplitude: 2.5 V
Lead Channel Setting Pacing Pulse Width: 0.5 ms
Lead Channel Setting Sensing Sensitivity: 2 mV
Pulse Gen Model: 2272
Pulse Gen Serial Number: 7946710

## 2024-01-18 ENCOUNTER — Ambulatory Visit: Payer: Self-pay | Admitting: Cardiovascular Disease

## 2024-01-18 DIAGNOSIS — G4733 Obstructive sleep apnea (adult) (pediatric): Secondary | ICD-10-CM | POA: Diagnosis not present

## 2024-01-31 DIAGNOSIS — D485 Neoplasm of uncertain behavior of skin: Secondary | ICD-10-CM | POA: Diagnosis not present

## 2024-01-31 DIAGNOSIS — L98499 Non-pressure chronic ulcer of skin of other sites with unspecified severity: Secondary | ICD-10-CM | POA: Diagnosis not present

## 2024-01-31 DIAGNOSIS — L821 Other seborrheic keratosis: Secondary | ICD-10-CM | POA: Diagnosis not present

## 2024-01-31 DIAGNOSIS — Z85828 Personal history of other malignant neoplasm of skin: Secondary | ICD-10-CM | POA: Diagnosis not present

## 2024-01-31 DIAGNOSIS — D1801 Hemangioma of skin and subcutaneous tissue: Secondary | ICD-10-CM | POA: Diagnosis not present

## 2024-01-31 DIAGNOSIS — L570 Actinic keratosis: Secondary | ICD-10-CM | POA: Diagnosis not present

## 2024-02-04 DIAGNOSIS — E1122 Type 2 diabetes mellitus with diabetic chronic kidney disease: Secondary | ICD-10-CM | POA: Diagnosis not present

## 2024-02-04 DIAGNOSIS — I10 Essential (primary) hypertension: Secondary | ICD-10-CM | POA: Diagnosis not present

## 2024-02-04 DIAGNOSIS — E782 Mixed hyperlipidemia: Secondary | ICD-10-CM | POA: Diagnosis not present

## 2024-02-14 DIAGNOSIS — Z20828 Contact with and (suspected) exposure to other viral communicable diseases: Secondary | ICD-10-CM | POA: Diagnosis not present

## 2024-02-14 DIAGNOSIS — U071 COVID-19: Secondary | ICD-10-CM | POA: Diagnosis not present

## 2024-02-14 DIAGNOSIS — R051 Acute cough: Secondary | ICD-10-CM | POA: Diagnosis not present

## 2024-02-14 DIAGNOSIS — Z6828 Body mass index (BMI) 28.0-28.9, adult: Secondary | ICD-10-CM | POA: Diagnosis not present

## 2024-02-21 DIAGNOSIS — E7849 Other hyperlipidemia: Secondary | ICD-10-CM | POA: Diagnosis not present

## 2024-02-21 DIAGNOSIS — R739 Hyperglycemia, unspecified: Secondary | ICD-10-CM | POA: Diagnosis not present

## 2024-02-21 DIAGNOSIS — E1122 Type 2 diabetes mellitus with diabetic chronic kidney disease: Secondary | ICD-10-CM | POA: Diagnosis not present

## 2024-03-01 DIAGNOSIS — G301 Alzheimer's disease with late onset: Secondary | ICD-10-CM | POA: Diagnosis not present

## 2024-03-01 DIAGNOSIS — Z23 Encounter for immunization: Secondary | ICD-10-CM | POA: Diagnosis not present

## 2024-03-01 DIAGNOSIS — E782 Mixed hyperlipidemia: Secondary | ICD-10-CM | POA: Diagnosis not present

## 2024-03-01 DIAGNOSIS — Z6828 Body mass index (BMI) 28.0-28.9, adult: Secondary | ICD-10-CM | POA: Diagnosis not present

## 2024-03-01 DIAGNOSIS — N401 Enlarged prostate with lower urinary tract symptoms: Secondary | ICD-10-CM | POA: Diagnosis not present

## 2024-03-01 DIAGNOSIS — I10 Essential (primary) hypertension: Secondary | ICD-10-CM | POA: Diagnosis not present

## 2024-03-06 NOTE — Progress Notes (Signed)
 Remote PPM Transmission

## 2024-03-07 DIAGNOSIS — I10 Essential (primary) hypertension: Secondary | ICD-10-CM | POA: Diagnosis not present

## 2024-03-07 DIAGNOSIS — E782 Mixed hyperlipidemia: Secondary | ICD-10-CM | POA: Diagnosis not present

## 2024-03-07 DIAGNOSIS — E1122 Type 2 diabetes mellitus with diabetic chronic kidney disease: Secondary | ICD-10-CM | POA: Diagnosis not present

## 2024-03-10 DIAGNOSIS — G301 Alzheimer's disease with late onset: Secondary | ICD-10-CM | POA: Diagnosis not present

## 2024-03-10 DIAGNOSIS — Z6828 Body mass index (BMI) 28.0-28.9, adult: Secondary | ICD-10-CM | POA: Diagnosis not present

## 2024-03-10 DIAGNOSIS — I10 Essential (primary) hypertension: Secondary | ICD-10-CM | POA: Diagnosis not present

## 2024-03-10 DIAGNOSIS — E782 Mixed hyperlipidemia: Secondary | ICD-10-CM | POA: Diagnosis not present

## 2024-03-10 DIAGNOSIS — E119 Type 2 diabetes mellitus without complications: Secondary | ICD-10-CM | POA: Diagnosis not present

## 2024-03-28 DIAGNOSIS — Z6829 Body mass index (BMI) 29.0-29.9, adult: Secondary | ICD-10-CM | POA: Diagnosis not present

## 2024-03-28 DIAGNOSIS — D485 Neoplasm of uncertain behavior of skin: Secondary | ICD-10-CM | POA: Diagnosis not present

## 2024-03-28 DIAGNOSIS — B079 Viral wart, unspecified: Secondary | ICD-10-CM | POA: Diagnosis not present

## 2024-03-28 DIAGNOSIS — H9190 Unspecified hearing loss, unspecified ear: Secondary | ICD-10-CM | POA: Diagnosis not present

## 2024-04-04 ENCOUNTER — Encounter: Payer: Self-pay | Admitting: Physician Assistant

## 2024-04-05 DIAGNOSIS — H43391 Other vitreous opacities, right eye: Secondary | ICD-10-CM | POA: Diagnosis not present

## 2024-04-07 DIAGNOSIS — E782 Mixed hyperlipidemia: Secondary | ICD-10-CM | POA: Diagnosis not present

## 2024-04-07 DIAGNOSIS — I10 Essential (primary) hypertension: Secondary | ICD-10-CM | POA: Diagnosis not present

## 2024-04-07 DIAGNOSIS — E1122 Type 2 diabetes mellitus with diabetic chronic kidney disease: Secondary | ICD-10-CM | POA: Diagnosis not present

## 2024-04-12 ENCOUNTER — Ambulatory Visit: Payer: Medicare PPO

## 2024-04-12 DIAGNOSIS — I495 Sick sinus syndrome: Secondary | ICD-10-CM

## 2024-04-13 ENCOUNTER — Ambulatory Visit: Payer: Self-pay | Admitting: Cardiovascular Disease

## 2024-04-13 LAB — CUP PACEART REMOTE DEVICE CHECK
Battery Remaining Longevity: 50 mo
Battery Remaining Percentage: 38 %
Battery Voltage: 2.98 V
Brady Statistic AP VP Percent: 1 %
Brady Statistic AP VS Percent: 1 %
Brady Statistic AS VP Percent: 1 %
Brady Statistic AS VS Percent: 99 %
Brady Statistic RA Percent Paced: 1 %
Brady Statistic RV Percent Paced: 1 %
Date Time Interrogation Session: 20251105020015
Implantable Lead Connection Status: 753985
Implantable Lead Connection Status: 753985
Implantable Lead Implant Date: 20171017
Implantable Lead Implant Date: 20171017
Implantable Lead Location: 753859
Implantable Lead Location: 753860
Implantable Pulse Generator Implant Date: 20171017
Lead Channel Impedance Value: 390 Ohm
Lead Channel Impedance Value: 440 Ohm
Lead Channel Pacing Threshold Amplitude: 0.5 V
Lead Channel Pacing Threshold Amplitude: 1 V
Lead Channel Pacing Threshold Pulse Width: 0.5 ms
Lead Channel Pacing Threshold Pulse Width: 0.5 ms
Lead Channel Sensing Intrinsic Amplitude: 3.8 mV
Lead Channel Sensing Intrinsic Amplitude: 6.4 mV
Lead Channel Setting Pacing Amplitude: 1.5 V
Lead Channel Setting Pacing Amplitude: 2.5 V
Lead Channel Setting Pacing Pulse Width: 0.5 ms
Lead Channel Setting Sensing Sensitivity: 2 mV
Pulse Gen Model: 2272
Pulse Gen Serial Number: 7946710

## 2024-04-14 DIAGNOSIS — F03B0 Unspecified dementia, moderate, without behavioral disturbance, psychotic disturbance, mood disturbance, and anxiety: Secondary | ICD-10-CM | POA: Diagnosis not present

## 2024-04-14 NOTE — Progress Notes (Signed)
 Remote PPM Transmission

## 2024-04-25 ENCOUNTER — Ambulatory Visit: Attending: Cardiology | Admitting: Cardiology

## 2024-06-20 NOTE — Progress Notes (Addendum)
 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 year old RH male with a history of hypertension, hyperlipidemia, DM2, s/p PMP due to SSS, OSA on BiPAP, CAD and a diagnosis of dementia due to Alzheimer's disease seen today for evaluation of memory loss, continuation of care after his prior neurologist, Dr. Genevie from [facility] has relocated to [place] . MoCA today is 10/30.  Patient is currently on memantine  10 mg once daily (diarrhea with 2/day) and rivastigmine  13.3 mg TD daily. Patient is able to participate on ADLs and to drive short distances without difficulties. Mood is controlled by PCP . Patient is accompanied by his wife who supplement the history.  Follow up in 6 months Continue memantine  10 mg once a day and rivastigmine  9.5  mg TD daily (diarrhea with 13.3) Continue to control mood as per PCP he is on Zoloft  Recommend good control of cardiovascular risk factors.   Monitor driving    Discussed the use of AI scribe software for clinical note transcription with the patient, who gave verbal consent to proceed.  History of Present Illness Joel Nelson is an 89 year old male who presents with memory loss. He is accompanied by his wife. He was referred by his new primary care doctor for continuation of neurological care.  He has been experiencing memory issues for over ten years, with initial changes noted around 2015. By 2016, these changes became more pronounced, including social withdrawal and decreased interest in activities with his grandchildren. Short-term memory issues were present early on, and long-term memory changes began around 2017 or 2018. He requires prompting to recall past events and has no wandering tendencies. It is sad that we had so may wonderful trips and he has no recall of them, but with prompting he may remember some details. No hallucinations or paranoia. Sometimes he may confuse his  second with his first wife, especially in the evenings.  He may misplace some objects such as the keys, but has air tag to be able to find it.  He is currently taking memantine  once daily due to diarrhea with twice-daily dosing, and a rivastigmine  patch (13.3 mg daily). He also takes Zoloft , which was increased to a full tablet in October 2025.  He has a history of sleep apnea and uses a BiPAP machine, although he struggles to use it consistently due to nasal congestion. He recently managed to use it for almost three hours, which is a record for him-wife says. He experiences nasal congestion, particularly at night, and takes Claritin daily, which provides some relief. He may moan at night or talk in his sleep but denies nightmares or very vivid dreams.  He is able to dress and shower without prompting.   He has a history of a fall in March 2025, resulting in a fractured nose, which has since healed. He uses a cane at times for stability. HE walks around the block alone without getting lost. He has a history of benign prostatic hypertrophy  with slow urinary stream at times.  There is a strong family history of dementia on the maternal side, including his mother, her siblings, and his maternal grandmother. He has no known history of stroke, vision changes, or tremors.  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 He lives with his wife, does not smoke or drink alcohol , and has no history of heavy alcohol   or tobacco use.   He continues to drive short distances with wife as copilot, but today he was driven by a family member.    Allergies[1]  Current Outpatient Medications  Medication Instructions   acetaminophen  (TYLENOL ) 500 mg, Oral, Every 6 hours PRN   aspirin  EC 81 mg, Oral, Daily   cyanocobalamin  (VITAMIN B12) 1,000 mcg, Every morning   docusate sodium  (COLACE) 100 mg, Oral, 2 times daily   ezetimibe (ZETIA) 10 mg, Daily    fluticasone (FLONASE) 50 MCG/ACT nasal spray 1 spray, Daily PRN   losartan  (COZAAR ) 25 mg, Daily   memantine  (NAMENDA ) 10 mg, Daily   Multiple Vitamin (MULTI-VITAMINS) TABS 1 tablet, Daily   nitroGLYCERIN  (NITROSTAT ) 0.4 mg, Every 5 min PRN   oxyCODONE  (OXY IR/ROXICODONE ) 5 mg, Oral, Every 6 hours PRN   pantoprazole  (PROTONIX ) 40 mg, Daily   Polyethyl Glycol-Propyl Glycol (SYSTANE) 0.4-0.3 % SOLN 1 drop, Daily PRN   polyethylene glycol (MIRALAX ) 17 g, Oral, Daily   QUEtiapine  (SEROQUEL ) 25 mg, Oral, Daily at bedtime   rivastigmine  (EXELON ) 9.5 mg, Daily   rosuvastatin (CRESTOR) 5 mg, Daily   sertraline  (ZOLOFT ) 25 mg, Daily at bedtime   simethicone  (MYLICON) 80 mg, Oral, 4 times daily   tamsulosin  (FLOMAX ) 0.4 mg, Daily    VITALS:   Vitals:   06/21/24 1316  BP: (!) 145/70  Pulse: 62  Resp: 20  SpO2: 95%  Weight: 189 lb (85.7 kg)  Height: 5' 8 (1.727 m)     Neurological Exam      No data to display              No data to display            Orientation:  Alert and oriented to person, place and not to time . No aphasia or dysarthria. Fund of knowledge is reduced. Recent and remote memory impaired.  Attention and concentration are reduced.  Able to name objects and unable to repeat phrases. Delayed recall  0/5 .  Cranial nerves: There is good facial symmetry. Extraocular muscles are intact and visual fields are full to confrontational testing. Speech is fluent and clear. No tongue deviation. Hearing is intact to conversational tone.  Tone: Tone is good throughout. Sensation: Sensation is intact to light touch.  Vibration is intact at the bilateral big toe.  Coordination: The patient has no difficulty with RAM's or FNF bilaterally. Normal finger to nose  Motor: Strength is 5/5 in the bilateral upper and lower extremities. There is no pronator drift. There are no fasciculations noted. DTR's: Deep tendon reflexes are 2/4 bilaterally. Gait and Station: The patient is able  to ambulate with some difficulty due to arthritis. Gait is cautious and narrow. Stride length is normal.        Thank you for allowing us  the opportunity to participate in the care of this nice patient. Please do not hesitate to contact us  for any questions or concerns.   Total time spent on today's visit was 63 minutes dedicated to this patient today, preparing to see patient, examining the patient, ordering tests and/or medications and counseling the patient, documenting clinical information in the EHR or other health record, independently interpreting results and communicating results to the patient/family, discussing treatment and goals, answering patient's questions and coordinating care.  Cc:  Toribio Jerel MATSU, MD  Camie Sevin 06/21/2024 1:53 PM       [1] No Known Allergies  "

## 2024-06-21 ENCOUNTER — Encounter: Payer: Self-pay | Admitting: Physician Assistant

## 2024-06-21 ENCOUNTER — Encounter

## 2024-06-21 ENCOUNTER — Ambulatory Visit (INDEPENDENT_AMBULATORY_CARE_PROVIDER_SITE_OTHER): Payer: Self-pay | Admitting: Physician Assistant

## 2024-06-21 VITALS — BP 145/70 | HR 62 | Resp 20 | Ht 68.0 in | Wt 189.0 lb

## 2024-06-21 DIAGNOSIS — F0280 Dementia in other diseases classified elsewhere without behavioral disturbance: Secondary | ICD-10-CM | POA: Diagnosis not present

## 2024-06-21 DIAGNOSIS — G309 Alzheimer's disease, unspecified: Secondary | ICD-10-CM | POA: Diagnosis not present

## 2024-06-21 NOTE — Patient Instructions (Addendum)
 June 29 at 11:30  Continue same meds

## 2024-06-23 LAB — LAB REPORT - SCANNED
A1c: 7
Albumin, Urine POC: 10.9
Creatinine, POC: 92.3 mg/dL
EGFR: 71.2
Microalb Creat Ratio: 12

## 2024-06-26 ENCOUNTER — Encounter: Payer: Self-pay | Admitting: Cardiology

## 2024-06-26 ENCOUNTER — Ambulatory Visit: Admitting: Cardiology

## 2024-06-26 VITALS — BP 124/76 | HR 66 | Ht 68.0 in | Wt 188.0 lb

## 2024-06-26 DIAGNOSIS — I25118 Atherosclerotic heart disease of native coronary artery with other forms of angina pectoris: Secondary | ICD-10-CM

## 2024-06-26 DIAGNOSIS — E782 Mixed hyperlipidemia: Secondary | ICD-10-CM | POA: Diagnosis not present

## 2024-06-26 DIAGNOSIS — I10 Essential (primary) hypertension: Secondary | ICD-10-CM | POA: Diagnosis not present

## 2024-06-26 DIAGNOSIS — I495 Sick sinus syndrome: Secondary | ICD-10-CM | POA: Diagnosis not present

## 2024-06-26 NOTE — Progress Notes (Signed)
 "    Cardiology Office Note  Date: 06/26/2024   ID: Brant Peets, DOB 03-Aug-1933, MRN [id number]  History of Present Illness: Joel Nelson is an 89 y.o. male last seen in March 2025.  He is here with his wife for a follow-up visit.  He does not report any definite change in symptoms, no obvious angina.  He does have dementia which limits his history.  She states that he sleeps a lot of the time, is not very active.  He was hospitalized last year with acute pancreatitis and ultimately underwent laparoscopic cholecystectomy.  We went over his medications.  He reports compliance with current regimen.  I did review his recent lab work, he has follow-up pending with Dr. Toribio.  St. Jude pacemaker in place with follow-up with Dr. Nancey.  Device interrogation in November 2025 showed normal function with brief ventricular lead noise.  Physical Exam: VS:  BP 124/76 (BP Location: Left Arm, Patient Position: Sitting, Cuff Size: Normal)   Pulse 66   Ht 5' 8 (1.727 m)   Wt 188 lb (85.3 kg)   SpO2 95%   BMI 28.59 kg/m , BMI Body mass index is 28.59 kg/m.  Wt Readings from Last 3 Encounters:  06/26/24 188 lb (85.3 kg)  06/21/24 189 lb (85.7 kg)  09/23/23 188 lb 6.4 oz (85.5 kg)    General: Patient appears comfortable at rest. HEENT: Conjunctiva and lids normal. Neck: Supple, no elevated JVP or carotid bruits. Lungs: Clear to auscultation, nonlabored breathing at rest. Cardiac: Regular rate and rhythm, no S3 or significant systolic murmur. Extremities: No pitting edema.  ECG:  An ECG dated 09/22/2023 was personally reviewed today and demonstrated:  Sinus rhythm with PAC.  Labwork: 09/25/2023: ALT 103; AST 66; BUN 13; Creatinine, Ser 1.06; Hemoglobin 14.1; Magnesium 1.9; Platelets 305; Potassium 3.4; Sodium 137     Component Value Date/Time   CHOL 104 09/18/2023 2156   TRIG 69 09/18/2023 2156   HDL 48 09/18/2023 2156   CHOLHDL 2.2 09/18/2023 2156   VLDL 14 09/18/2023 2156   LDLCALC  42 09/18/2023 2156  January 2026: BUN 19, creatinine 1.01, GFR 71, potassium 4.4, AST 48, ALT 84, cholesterol 122, triglycerides 107, HDL 65, LDL 36  Other Studies Reviewed Today:  Echocardiogram 09/23/2023:  1. Left ventricular ejection fraction, by estimation, is 55 to 60%. The  left ventricle has normal function. The left ventricle has no regional  wall motion abnormalities. Left ventricular diastolic parameters were  normal.   2. Right ventricular systolic function is normal. The right ventricular  size is normal.   3. The mitral valve is normal in structure. Mild mitral valve  regurgitation. No evidence of mitral stenosis.   4. The aortic valve is tricuspid. Aortic valve regurgitation is not  visualized. Aortic valve sclerosis is present, with no evidence of aortic  valve stenosis.   Assessment and Plan:  1.  CAD status post PTCA of the circumflex in 1995.  LVEF 55 to 60% by echocardiogram in April 2025.  He does not report any angina at low-level activity on medical therapy and plan is to continue with observation at this point.  Currently on aspirin  81 mg daily, Zetia 10 mg daily, Crestor 5 mg daily, and as needed nitroglycerin .   2.  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 Nancey.   3.  Primary hypertension.  Blood pressure is normal today.  No changes made to current regimen, he was previously on Cozaar   25 mg daily.   4.  Mixed hyperlipidemia.  Recent LDL 36 and HDL 65.  He continues on Zetia 10 mg daily and Crestor 5 mg daily.  Could potentially consider dropping Zetia, his numbers have been well-controlled.  Disposition:  Follow up 6 months.  Signed, Jayson JUDITHANN Sierras, M.D., F.A.C.C. [facility]

## 2024-06-26 NOTE — Patient Instructions (Addendum)
 Medication Instructions:   Your physician recommends that you continue on your current medications as directed. Please refer to the Current Medication list given to you today.   Labwork: None today  Testing/Procedures: None today  Follow-Up: 6 months Dr.McDowell  Any Other Special Instructions Will Be Listed Below (If Applicable).  If you need a refill on your cardiac medications before your next appointment, please call your pharmacy.

## 2024-07-12 ENCOUNTER — Ambulatory Visit

## 2024-07-12 LAB — CUP PACEART REMOTE DEVICE CHECK
Battery Remaining Longevity: 47 mo
Battery Remaining Percentage: 37 %
Battery Voltage: 2.96 V
Brady Statistic AP VP Percent: 1 %
Brady Statistic AP VS Percent: 1 %
Brady Statistic AS VP Percent: 1 %
Brady Statistic AS VS Percent: 99 %
Brady Statistic RA Percent Paced: 1 %
Brady Statistic RV Percent Paced: 1 %
Date Time Interrogation Session: 20260204020014
Implantable Lead Connection Status: 753985
Implantable Lead Connection Status: 753985
Implantable Lead Implant Date: 20171017
Implantable Lead Implant Date: 20171017
Implantable Lead Location: 753859
Implantable Lead Location: 753860
Implantable Pulse Generator Implant Date: 20171017
Lead Channel Impedance Value: 400 Ohm
Lead Channel Impedance Value: 440 Ohm
Lead Channel Pacing Threshold Amplitude: 0.5 V
Lead Channel Pacing Threshold Amplitude: 1 V
Lead Channel Pacing Threshold Pulse Width: 0.5 ms
Lead Channel Pacing Threshold Pulse Width: 0.5 ms
Lead Channel Sensing Intrinsic Amplitude: 3.2 mV
Lead Channel Sensing Intrinsic Amplitude: 5.7 mV
Lead Channel Setting Pacing Amplitude: 1.5 V
Lead Channel Setting Pacing Amplitude: 2.5 V
Lead Channel Setting Pacing Pulse Width: 0.5 ms
Lead Channel Setting Sensing Sensitivity: 2 mV
Pulse Gen Model: 2272
Pulse Gen Serial Number: 7946710

## 2024-07-21 ENCOUNTER — Ambulatory Visit: Admitting: Cardiovascular Disease

## 2024-10-11 ENCOUNTER — Ambulatory Visit

## 2024-12-04 ENCOUNTER — Ambulatory Visit: Payer: Self-pay | Admitting: Physician Assistant

## 2025-01-10 ENCOUNTER — Ambulatory Visit

## 2025-04-11 ENCOUNTER — Ambulatory Visit

## 2025-07-11 ENCOUNTER — Ambulatory Visit

## 2025-10-10 ENCOUNTER — Ambulatory Visit
